# Patient Record
Sex: Female | Born: 1985 | Race: Black or African American | Hispanic: No | Marital: Married | Smoking: Never smoker
Health system: Southern US, Community
[De-identification: ages and names within clinical notes are randomized; demographics above are authoritative.]

## PROBLEM LIST (undated history)

## (undated) ENCOUNTER — Inpatient Hospital Stay (HOSPITAL_COMMUNITY): Payer: Self-pay

## (undated) DIAGNOSIS — A549 Gonococcal infection, unspecified: Secondary | ICD-10-CM

## (undated) DIAGNOSIS — O008 Other ectopic pregnancy without intrauterine pregnancy: Secondary | ICD-10-CM

## (undated) HISTORY — PX: DILATION AND CURETTAGE OF UTERUS: SHX78

## (undated) HISTORY — PX: WISDOM TOOTH EXTRACTION: SHX21

---
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
## 2003-10-26 ENCOUNTER — Emergency Department (HOSPITAL_COMMUNITY): Admission: EM | Admit: 2003-10-26 | Discharge: 2003-10-27 | Payer: Self-pay | Admitting: Emergency Medicine

## 2003-11-07 ENCOUNTER — Ambulatory Visit (HOSPITAL_COMMUNITY): Admission: RE | Admit: 2003-11-07 | Discharge: 2003-11-07 | Payer: Self-pay | Admitting: Obstetrics & Gynecology

## 2003-11-24 ENCOUNTER — Observation Stay (HOSPITAL_COMMUNITY): Admission: AD | Admit: 2003-11-24 | Discharge: 2003-11-25 | Payer: Self-pay | Admitting: Obstetrics

## 2003-12-08 ENCOUNTER — Emergency Department (HOSPITAL_COMMUNITY): Admission: AD | Admit: 2003-12-08 | Discharge: 2003-12-08 | Payer: Self-pay | Admitting: Family Medicine

## 2004-05-24 ENCOUNTER — Inpatient Hospital Stay (HOSPITAL_COMMUNITY): Admission: AD | Admit: 2004-05-24 | Discharge: 2004-05-24 | Payer: Self-pay | Admitting: Obstetrics

## 2004-05-25 ENCOUNTER — Inpatient Hospital Stay (HOSPITAL_COMMUNITY): Admission: AD | Admit: 2004-05-25 | Discharge: 2004-05-25 | Payer: Self-pay | Admitting: Obstetrics

## 2004-06-01 ENCOUNTER — Inpatient Hospital Stay (HOSPITAL_COMMUNITY): Admission: AD | Admit: 2004-06-01 | Discharge: 2004-06-04 | Payer: Self-pay | Admitting: Obstetrics & Gynecology

## 2004-09-05 ENCOUNTER — Inpatient Hospital Stay (HOSPITAL_COMMUNITY): Admission: AD | Admit: 2004-09-05 | Discharge: 2004-09-05 | Payer: Self-pay | Admitting: Obstetrics

## 2004-12-25 ENCOUNTER — Inpatient Hospital Stay (HOSPITAL_COMMUNITY): Admission: AD | Admit: 2004-12-25 | Discharge: 2004-12-25 | Payer: Self-pay | Admitting: Obstetrics

## 2005-08-04 ENCOUNTER — Inpatient Hospital Stay (HOSPITAL_COMMUNITY): Admission: AD | Admit: 2005-08-04 | Discharge: 2005-08-04 | Payer: Self-pay | Admitting: Obstetrics

## 2005-09-16 ENCOUNTER — Inpatient Hospital Stay (HOSPITAL_COMMUNITY): Admission: AD | Admit: 2005-09-16 | Discharge: 2005-09-16 | Payer: Self-pay | Admitting: Obstetrics & Gynecology

## 2005-09-28 ENCOUNTER — Inpatient Hospital Stay (HOSPITAL_COMMUNITY): Admission: AD | Admit: 2005-09-28 | Discharge: 2005-09-28 | Payer: Self-pay | Admitting: Obstetrics

## 2005-11-01 ENCOUNTER — Ambulatory Visit (HOSPITAL_COMMUNITY): Admission: RE | Admit: 2005-11-01 | Discharge: 2005-11-01 | Payer: Self-pay | Admitting: Obstetrics

## 2005-12-28 ENCOUNTER — Inpatient Hospital Stay (HOSPITAL_COMMUNITY): Admission: AD | Admit: 2005-12-28 | Discharge: 2005-12-28 | Payer: Self-pay | Admitting: Obstetrics

## 2006-03-09 ENCOUNTER — Inpatient Hospital Stay (HOSPITAL_COMMUNITY): Admission: AD | Admit: 2006-03-09 | Discharge: 2006-03-09 | Payer: Self-pay | Admitting: Obstetrics & Gynecology

## 2006-03-13 ENCOUNTER — Inpatient Hospital Stay (HOSPITAL_COMMUNITY): Admission: AD | Admit: 2006-03-13 | Discharge: 2006-03-13 | Payer: Self-pay | Admitting: Obstetrics & Gynecology

## 2006-03-17 ENCOUNTER — Inpatient Hospital Stay (HOSPITAL_COMMUNITY): Admission: AD | Admit: 2006-03-17 | Discharge: 2006-03-20 | Payer: Self-pay | Admitting: Obstetrics & Gynecology

## 2006-06-29 ENCOUNTER — Emergency Department (HOSPITAL_COMMUNITY): Admission: EM | Admit: 2006-06-29 | Discharge: 2006-06-29 | Payer: Self-pay | Admitting: Family Medicine

## 2006-11-10 ENCOUNTER — Emergency Department (HOSPITAL_COMMUNITY): Admission: EM | Admit: 2006-11-10 | Discharge: 2006-11-10 | Payer: Self-pay | Admitting: Family Medicine

## 2006-11-11 ENCOUNTER — Emergency Department (HOSPITAL_COMMUNITY): Admission: EM | Admit: 2006-11-11 | Discharge: 2006-11-11 | Payer: Self-pay | Admitting: Emergency Medicine

## 2007-02-13 ENCOUNTER — Emergency Department (HOSPITAL_COMMUNITY): Admission: EM | Admit: 2007-02-13 | Discharge: 2007-02-13 | Payer: Self-pay | Admitting: Family Medicine

## 2007-02-24 ENCOUNTER — Emergency Department (HOSPITAL_COMMUNITY): Admission: EM | Admit: 2007-02-24 | Discharge: 2007-02-24 | Payer: Self-pay | Admitting: Emergency Medicine

## 2007-05-11 ENCOUNTER — Emergency Department (HOSPITAL_COMMUNITY): Admission: EM | Admit: 2007-05-11 | Discharge: 2007-05-11 | Payer: Self-pay | Admitting: Family Medicine

## 2007-05-30 ENCOUNTER — Emergency Department (HOSPITAL_COMMUNITY): Admission: EM | Admit: 2007-05-30 | Discharge: 2007-05-30 | Payer: Self-pay | Admitting: Emergency Medicine

## 2007-06-26 ENCOUNTER — Emergency Department (HOSPITAL_COMMUNITY): Admission: EM | Admit: 2007-06-26 | Discharge: 2007-06-26 | Payer: Self-pay | Admitting: Emergency Medicine

## 2007-07-04 ENCOUNTER — Inpatient Hospital Stay (HOSPITAL_COMMUNITY): Admission: AD | Admit: 2007-07-04 | Discharge: 2007-07-04 | Payer: Self-pay | Admitting: Obstetrics & Gynecology

## 2007-07-12 ENCOUNTER — Emergency Department (HOSPITAL_COMMUNITY): Admission: EM | Admit: 2007-07-12 | Discharge: 2007-07-12 | Payer: Self-pay | Admitting: Emergency Medicine

## 2007-09-22 ENCOUNTER — Inpatient Hospital Stay (HOSPITAL_COMMUNITY): Admission: AD | Admit: 2007-09-22 | Discharge: 2007-09-22 | Payer: Self-pay | Admitting: Obstetrics & Gynecology

## 2007-10-02 ENCOUNTER — Inpatient Hospital Stay (HOSPITAL_COMMUNITY): Admission: AD | Admit: 2007-10-02 | Discharge: 2007-10-02 | Payer: Self-pay | Admitting: Obstetrics

## 2007-11-21 ENCOUNTER — Emergency Department (HOSPITAL_COMMUNITY): Admission: EM | Admit: 2007-11-21 | Discharge: 2007-11-21 | Payer: Self-pay | Admitting: Family Medicine

## 2007-12-17 ENCOUNTER — Inpatient Hospital Stay (HOSPITAL_COMMUNITY): Admission: AD | Admit: 2007-12-17 | Discharge: 2007-12-17 | Payer: Self-pay | Admitting: Obstetrics

## 2008-01-01 ENCOUNTER — Emergency Department (HOSPITAL_COMMUNITY): Admission: EM | Admit: 2008-01-01 | Discharge: 2008-01-01 | Payer: Self-pay | Admitting: Family Medicine

## 2008-03-01 ENCOUNTER — Emergency Department (HOSPITAL_COMMUNITY): Admission: EM | Admit: 2008-03-01 | Discharge: 2008-03-01 | Payer: Self-pay | Admitting: Family Medicine

## 2008-05-06 ENCOUNTER — Emergency Department (HOSPITAL_COMMUNITY): Admission: EM | Admit: 2008-05-06 | Discharge: 2008-05-06 | Payer: Self-pay | Admitting: Emergency Medicine

## 2008-05-08 ENCOUNTER — Emergency Department (HOSPITAL_COMMUNITY): Admission: EM | Admit: 2008-05-08 | Discharge: 2008-05-08 | Payer: Self-pay | Admitting: Emergency Medicine

## 2008-05-10 ENCOUNTER — Inpatient Hospital Stay (HOSPITAL_COMMUNITY): Admission: AD | Admit: 2008-05-10 | Discharge: 2008-05-10 | Payer: Self-pay | Admitting: Obstetrics

## 2008-07-02 ENCOUNTER — Emergency Department (HOSPITAL_COMMUNITY): Admission: EM | Admit: 2008-07-02 | Discharge: 2008-07-02 | Payer: Self-pay | Admitting: Family Medicine

## 2008-07-12 ENCOUNTER — Emergency Department (HOSPITAL_COMMUNITY): Admission: EM | Admit: 2008-07-12 | Discharge: 2008-07-12 | Payer: Self-pay | Admitting: Family Medicine

## 2008-09-23 ENCOUNTER — Emergency Department (HOSPITAL_COMMUNITY): Admission: EM | Admit: 2008-09-23 | Discharge: 2008-09-23 | Payer: Self-pay | Admitting: Family Medicine

## 2008-09-25 ENCOUNTER — Emergency Department (HOSPITAL_COMMUNITY): Admission: EM | Admit: 2008-09-25 | Discharge: 2008-09-25 | Payer: Self-pay | Admitting: Emergency Medicine

## 2008-11-15 ENCOUNTER — Inpatient Hospital Stay (HOSPITAL_COMMUNITY): Admission: AD | Admit: 2008-11-15 | Discharge: 2008-11-15 | Payer: Self-pay | Admitting: Obstetrics & Gynecology

## 2009-01-05 ENCOUNTER — Emergency Department (HOSPITAL_COMMUNITY): Admission: EM | Admit: 2009-01-05 | Discharge: 2009-01-05 | Payer: Self-pay | Admitting: Family Medicine

## 2009-05-13 ENCOUNTER — Emergency Department (HOSPITAL_COMMUNITY): Admission: EM | Admit: 2009-05-13 | Discharge: 2009-05-13 | Payer: Self-pay | Admitting: Family Medicine

## 2009-06-17 ENCOUNTER — Emergency Department (HOSPITAL_COMMUNITY): Admission: EM | Admit: 2009-06-17 | Discharge: 2009-06-17 | Payer: Self-pay | Admitting: Family Medicine

## 2009-09-15 ENCOUNTER — Emergency Department (HOSPITAL_COMMUNITY): Admission: EM | Admit: 2009-09-15 | Discharge: 2009-09-15 | Payer: Self-pay | Admitting: Emergency Medicine

## 2009-09-19 ENCOUNTER — Emergency Department (HOSPITAL_COMMUNITY): Admission: EM | Admit: 2009-09-19 | Discharge: 2009-09-19 | Payer: Self-pay | Admitting: Family Medicine

## 2010-02-24 ENCOUNTER — Emergency Department (HOSPITAL_COMMUNITY): Admission: EM | Admit: 2010-02-24 | Discharge: 2010-02-24 | Payer: Self-pay | Admitting: Family Medicine

## 2010-10-09 ENCOUNTER — Encounter: Payer: Self-pay | Admitting: Obstetrics & Gynecology

## 2010-10-27 ENCOUNTER — Inpatient Hospital Stay (INDEPENDENT_AMBULATORY_CARE_PROVIDER_SITE_OTHER)
Admission: RE | Admit: 2010-10-27 | Discharge: 2010-10-27 | Disposition: A | Discharge status: Home / Self Care | Payer: Medicaid Other | Source: Ambulatory Visit | Attending: Emergency Medicine | Admitting: Emergency Medicine

## 2010-10-27 DIAGNOSIS — R51 Headache: Secondary | ICD-10-CM

## 2010-10-27 LAB — POCT URINALYSIS DIPSTICK
Bilirubin Urine: NEGATIVE
Ketones, ur: NEGATIVE mg/dL
Protein, ur: NEGATIVE mg/dL
pH: 5.5 (ref 5.0–8.0)

## 2010-12-05 LAB — POCT PREGNANCY, URINE: Preg Test, Ur: POSITIVE

## 2010-12-06 LAB — POCT PREGNANCY, URINE: Preg Test, Ur: NEGATIVE

## 2010-12-24 LAB — HERPES SIMPLEX VIRUS CULTURE: Culture: DETECTED

## 2010-12-29 LAB — POCT URINALYSIS DIP (DEVICE)
Bilirubin Urine: NEGATIVE
Ketones, ur: NEGATIVE mg/dL
Nitrite: NEGATIVE
Protein, ur: NEGATIVE mg/dL
Specific Gravity, Urine: 1.025 (ref 1.005–1.030)
pH: 7 (ref 5.0–8.0)

## 2010-12-29 LAB — POCT PREGNANCY, URINE: Preg Test, Ur: NEGATIVE

## 2010-12-29 LAB — POCT I-STAT, CHEM 8: TCO2: 29 mmol/L (ref 0–100)

## 2011-01-04 LAB — URINALYSIS, ROUTINE W REFLEX MICROSCOPIC
Bilirubin Urine: NEGATIVE
Glucose, UA: NEGATIVE mg/dL
Nitrite: NEGATIVE
Specific Gravity, Urine: 1.02 (ref 1.005–1.030)

## 2011-02-04 NOTE — Discharge Summary (Signed)
NAME:  [name] [name], [name] [name]                           ACCOUNT NO.:  [id number]   MEDICAL RECORD NO.:  [id number]                   PATIENT TYPE:  INP   LOCATION:  9159                                 FACILITY:  [facility]   PHYSICIAN:  James A. Ashley Royalty, M.D.             DATE OF BIRTH:  07-10-1986   DATE OF ADMISSION:  07/12/2002  DATE OF DISCHARGE:  07/15/2002                                 DISCHARGE SUMMARY   DISCHARGE DIAGNOSES:  1. Intrauterine pregnancy at 37 and 1/[redacted] weeks gestation.  2. Preterm labor--recurrent, lysed.  3. Group B Streptococcus carrier.   OPERATIONS/SPECIAL PROCEDURES:  None.   DISCHARGE MEDICATIONS:  Terbutaline 2.5 mg p.o. q.4h.   HISTORY AND PHYSICAL:  This is an 25 year old gravida 2, para 0, AB 1 at [redacted]  weeks gestation on admission.  The patient was seen in maternity admissions  two or three days ago with symptoms of preterm labor.  Cultures were done at  that time and overturned as negative for gonorrhea and Chlamydia.  The  patient was noted to be Group B strep positive.  The patient responded well  at that time to subcutaneous terbutaline.  She was discharged home on  terbutaline 2.5 mg p.o. q.4h.  The patient presented on the day of admission  with similar symptoms and at the time of admission admitted she had not  taken her terbutaline for at least 5-6 hours.  For the remainder of the  history and physical, please see the chart.   HOSPITAL COURSE:  The patient was admitted to [facility].  Admission laboratories studies were drawn.  Cervix was noted to  be closed, soft, with a high presenting part.  The patient was given IV  fluids and failed to respond appropriately.  She was also given terbutaline  and we were unable to give sufficient quantities due to tachycardia to lyse  her contractions; hence, she was begun on IV magnesium sulfate.  The  magnesium sulfate did successfully lyse her contractions.  We also gave IV  antibiotics.  The  patient mentioned the possibility of leaving against  medical advice on a couple of occasions.  For this reason, she was given  betamethasone in divided doses over 24 hours.  On the morning of October 27,  her uterus was felt to be quiescent and she was switched from IV magnesium  sulfate to p.o. terbutaline 2.5 every 4 hours.  She continued on that course  with having irritability only and was felt stable for discharge on the  afternoon of July 15, 2002.  She was discharged home in a satisfactory  condition.   ACCESSORY CLINICAL FINDINGS:  Magnesium level, October 25, was 5.7.  CBC on  admission revealed a white blood count of 13.6, hemoglobin 11.5, platelet  count 382,000.    DISPOSITION:  The patient is to return to Dr. Elliot Gault' office the morning of  July 17, 2002, for her antenatal visit.                                                James A. Ashley Royalty, M.D.    JAM/MEDQ  D:  07/15/2002  T:  07/16/2002  Job:  [id number]   cc:   Georgina Peer, M.D.  [address]  [place]  [place] [address]  Fax: [phone]

## 2011-02-04 NOTE — H&P (Signed)
NAMECHANTAL, WORTHEY                 ACCOUNT NO.:  [id number]   MEDICAL RECORD NO.:  [id number]          PATIENT TYPE:  INP   LOCATION:  9168                          FACILITY:  [facility]   PHYSICIAN:  Roseanna Rainbow, M.D.DATE OF BIRTH:  12-12-1985   DATE OF ADMISSION:  03/17/2006  DATE OF DISCHARGE:                                HISTORY & PHYSICAL   CHIEF COMPLAINT:  The patient is a 25 year old para 2 with an intrauterine  pregnancy at 39+ weeks complaining of regular uterine contractions.   HISTORY OF PRESENT ILLNESS:  Please see the above.   ALLERGIES:  No known drug allergies.   MEDICATIONS:  Prenatal vitamins, Tylenol.   SOCIAL HISTORY:  No tobacco, ethanol or drug use.   OBSTETRIC RISK FACTORS:  None.   LABS:  Blood type A positive, antibody screen negative. GBS positive. HIV  nonreactive.   PAST OBSTETRICAL HISTORY:  She is status post two NSVD's and one voluntary  termination of pregnancy.   PAST MEDICAL HISTORY:  She denies.   PAST SURGICAL HISTORY:  Please see the above.   PHYSICAL EXAMINATION:  VITAL SIGNS:  Temperature 98.8, pulse 105,  respirations 20, blood pressure 122/67.  Fetal heart rate reassuring.  Tocodynamometer with contractions every 3 to 5 minutes.  STERILE VAGINAL EXAM:  (Per the R. N.) The cervix is 4 cm dilated, 70%  effaced.   ASSESSMENT:  Multipara at term, latent labor, fetal heart tracing consistent  with fetal well being. GBS positive.   PLAN:  Admission. Penicillin GBS prophylaxis. Expectant management.      Roseanna Rainbow, M.D.  Electronically Signed     LAJ/MEDQ  D:  03/17/2006  T:  03/17/2006  Job:  [id number]

## 2011-05-31 ENCOUNTER — Inpatient Hospital Stay (INDEPENDENT_AMBULATORY_CARE_PROVIDER_SITE_OTHER)
Admission: RE | Admit: 2011-05-31 | Discharge: 2011-05-31 | Disposition: A | Discharge status: Home / Self Care | Payer: Medicaid Other | Source: Ambulatory Visit | Attending: Family Medicine | Admitting: Family Medicine

## 2011-05-31 DIAGNOSIS — B009 Herpesviral infection, unspecified: Secondary | ICD-10-CM

## 2011-06-02 LAB — HERPES SIMPLEX VIRUS CULTURE

## 2011-06-08 LAB — URINALYSIS, ROUTINE W REFLEX MICROSCOPIC
Bilirubin Urine: NEGATIVE
Glucose, UA: NEGATIVE
Glucose, UA: NEGATIVE
Hgb urine dipstick: NEGATIVE
Ketones, ur: NEGATIVE
Leukocytes, UA: NEGATIVE
Protein, ur: NEGATIVE
Specific Gravity, Urine: 1.02
Urobilinogen, UA: 0.2
pH: 6

## 2011-06-08 LAB — CBC
HCT: 38.7
Hemoglobin: 13.4
MCHC: 34.5
MCV: 92.2
Platelets: 345

## 2011-06-08 LAB — URINE MICROSCOPIC-ADD ON

## 2011-06-08 LAB — POCT PREGNANCY, URINE: Operator id: 117411

## 2011-06-08 LAB — HCG, QUANTITATIVE, PREGNANCY: hCG, Beta Chain, Quant, S: 3505 — ABNORMAL HIGH

## 2011-06-13 LAB — POCT URINALYSIS DIP (DEVICE)
Hgb urine dipstick: NEGATIVE
Nitrite: NEGATIVE
Urobilinogen, UA: 0.2
pH: 6.5

## 2011-06-13 LAB — WET PREP, GENITAL
Clue Cells Wet Prep HPF POC: NONE SEEN
Yeast Wet Prep HPF POC: NONE SEEN

## 2011-06-13 LAB — HCG, QUANTITATIVE, PREGNANCY: hCG, Beta Chain, Quant, S: 27 — ABNORMAL HIGH

## 2011-06-13 LAB — POCT PREGNANCY, URINE
Preg Test, Ur: POSITIVE
Preg Test, Ur: NEGATIVE

## 2011-06-13 LAB — GC/CHLAMYDIA PROBE AMP, GENITAL: GC Probe Amp, Genital: NEGATIVE

## 2011-06-16 LAB — GC/CHLAMYDIA PROBE AMP, GENITAL
Chlamydia, DNA Probe: NEGATIVE
GC Probe Amp, Genital: NEGATIVE

## 2011-06-16 LAB — WET PREP, GENITAL

## 2011-06-29 LAB — GC/CHLAMYDIA PROBE AMP, GENITAL
Chlamydia, DNA Probe: NEGATIVE
GC Probe Amp, Genital: NEGATIVE

## 2011-06-29 LAB — CBC
Hemoglobin: 13.3
MCHC: 34.2
Platelets: 301
RDW: 12.2

## 2011-06-29 LAB — WET PREP, GENITAL: Trich, Wet Prep: NONE SEEN

## 2011-06-30 LAB — POCT URINALYSIS DIP (DEVICE)
Glucose, UA: NEGATIVE
Glucose, UA: NEGATIVE
Leukocytes, UA: NEGATIVE
Nitrite: NEGATIVE
Nitrite: NEGATIVE
Operator id: 235561
Urobilinogen, UA: 0.2
Urobilinogen, UA: 0.2

## 2011-06-30 LAB — GC/CHLAMYDIA PROBE AMP, GENITAL
Chlamydia, DNA Probe: NEGATIVE
GC Probe Amp, Genital: NEGATIVE

## 2011-06-30 LAB — POCT PREGNANCY, URINE: Preg Test, Ur: NEGATIVE

## 2011-06-30 LAB — WET PREP, GENITAL: Clue Cells Wet Prep HPF POC: NONE SEEN

## 2011-07-01 LAB — POCT PREGNANCY, URINE
Operator id: 239701
Preg Test, Ur: POSITIVE

## 2011-07-01 LAB — POCT URINALYSIS DIP (DEVICE)
Bilirubin Urine: NEGATIVE
Glucose, UA: NEGATIVE
Nitrite: NEGATIVE

## 2011-07-01 LAB — GC/CHLAMYDIA PROBE AMP, GENITAL
Chlamydia, DNA Probe: NEGATIVE
GC Probe Amp, Genital: NEGATIVE

## 2011-07-01 LAB — WET PREP, GENITAL
Trich, Wet Prep: NONE SEEN
Yeast Wet Prep HPF POC: NONE SEEN

## 2011-07-07 LAB — WET PREP, GENITAL: Yeast Wet Prep HPF POC: NONE SEEN

## 2011-07-07 LAB — POCT URINALYSIS DIP (DEVICE)
Bilirubin Urine: NEGATIVE
Glucose, UA: NEGATIVE
Hgb urine dipstick: NEGATIVE
Specific Gravity, Urine: >=1.03

## 2011-07-07 LAB — GC/CHLAMYDIA PROBE AMP, GENITAL: Chlamydia, DNA Probe: NEGATIVE

## 2011-09-17 ENCOUNTER — Inpatient Hospital Stay (HOSPITAL_COMMUNITY)
Admission: AD | Admit: 2011-09-17 | Discharge: 2011-09-17 | Disposition: A | Discharge status: Home / Self Care | Payer: Medicaid Other | Source: Ambulatory Visit | Attending: Obstetrics | Admitting: Obstetrics

## 2011-09-17 ENCOUNTER — Inpatient Hospital Stay (HOSPITAL_COMMUNITY): Payer: Medicaid Other

## 2011-09-17 ENCOUNTER — Encounter (HOSPITAL_COMMUNITY): Payer: Self-pay | Admitting: *Deleted

## 2011-09-17 DIAGNOSIS — O418X90 Other specified disorders of amniotic fluid and membranes, unspecified trimester, not applicable or unspecified: Secondary | ICD-10-CM

## 2011-09-17 DIAGNOSIS — O468X9 Other antepartum hemorrhage, unspecified trimester: Secondary | ICD-10-CM

## 2011-09-17 DIAGNOSIS — R109 Unspecified abdominal pain: Secondary | ICD-10-CM | POA: Insufficient documentation

## 2011-09-17 DIAGNOSIS — O209 Hemorrhage in early pregnancy, unspecified: Secondary | ICD-10-CM

## 2011-09-17 LAB — WET PREP, GENITAL
Trich, Wet Prep: NONE SEEN
Yeast Wet Prep HPF POC: NONE SEEN

## 2011-09-17 LAB — URINALYSIS, ROUTINE W REFLEX MICROSCOPIC
Bilirubin Urine: NEGATIVE
Leukocytes, UA: NEGATIVE
Protein, ur: NEGATIVE mg/dL
pH: 5.5 (ref 5.0–8.0)

## 2011-09-17 LAB — URINE MICROSCOPIC-ADD ON

## 2011-09-17 LAB — HCG, QUANTITATIVE, PREGNANCY: hCG, Beta Chain, Quant, S: 120470 m[IU]/mL — ABNORMAL HIGH (ref <5)

## 2011-09-17 LAB — CBC
Hemoglobin: 13.9 g/dL (ref 12.0–15.0)
MCHC: 33.9 g/dL (ref 30.0–36.0)
RDW: 12.6 % (ref 11.5–15.5)
WBC: 10.9 10*3/uL — ABNORMAL HIGH (ref 4.0–10.5)

## 2011-09-17 LAB — POCT PREGNANCY, URINE: Preg Test, Ur: POSITIVE

## 2011-09-17 LAB — ABO/RH: ABO/RH(D): A POS

## 2011-09-17 MED ORDER — PROMETHAZINE HCL 25 MG PO TABS: 25.0000 mg | ORAL_TABLET | Freq: Four times a day (QID) | ORAL | Status: DC | PRN

## 2011-09-17 NOTE — ED Provider Notes (Signed)
History     Chief Complaint  Patient presents with  . Vaginal Bleeding   HPI [name] [name] 25 y.o. 7w 1d by LMP.  Has been seen in the office once during this pregnancy.  Has not had an ultrasound.  Has been having some vaginal bleeding and cramping for 3 days.   OB History    Grav Para Term Preterm Abortions TAB SAB Ect Mult Living   8 3 3  0 4 4 0 0 0 3      Past Medical History  Diagnosis Date  . Abortion history     Past Surgical History  Procedure Date  . No past surgeries     No family history on file.  History  Substance Use Topics  . Smoking status: Never Smoker   . Smokeless tobacco: Not on file  . Alcohol Use: No    Allergies:  Allergies  Allergen Reactions  . Tamiflu Rash    Prescriptions prior to admission  Medication Sig Dispense Refill  . acetaminophen (TYLENOL) 500 MG tablet Take 500-1,000 mg by mouth every 6 (six) hours as needed. Head aches         Review of Systems  Gastrointestinal: Positive for abdominal pain.  Genitourinary:       Vaginal bleeding.   Physical Exam   Blood pressure 128/86, pulse 99, temperature 98.5 F (36.9 C), temperature source Oral, resp. rate 18, height 4\' 11"  (1.499 m), weight 141 lb (63.957 kg), last menstrual period 07/29/2011.  Physical Exam  Nursing note and vitals reviewed. Constitutional: She is oriented to person, place, and time. She appears well-developed and well-nourished.  HENT:  Head: Normocephalic.  Eyes: EOM are normal.  Neck: Neck supple.  GI: Soft. There is no tenderness. There is no rebound and no guarding.  Genitourinary:       Speculum exam: Vagina - Small amount of creamy discharge, small amount of old blood noted- brown streaks only Cervix - No contact bleeding Bimanual exam: Cervix closed Uterus non tender, 6 week size Adnexa non tender, no masses bilaterally GC/Chlam, wet prep done Chaperone present for exam.  Musculoskeletal: Normal range of motion.  Neurological: She is  alert and oriented to person, place, and time.  Skin: Skin is warm and dry.  Psychiatric: She has a normal mood and affect.    MAU Course  Procedures Results for orders placed during the hospital encounter of 09/17/11 (from the past 24 hour(s))  URINALYSIS, ROUTINE W REFLEX MICROSCOPIC     Status: Abnormal   Collection Time   09/17/11  6:20 PM      Component Value Range   Color, Urine YELLOW  YELLOW    APPearance CLEAR  CLEAR    Specific Gravity, Urine 1.025  1.005 - 1.030    pH 5.5  5.0 - 8.0    Glucose, UA NEGATIVE  NEGATIVE (mg/dL)   Hgb urine dipstick SMALL (*) NEGATIVE    Bilirubin Urine NEGATIVE  NEGATIVE    Ketones, ur NEGATIVE  NEGATIVE (mg/dL)   Protein, ur NEGATIVE  NEGATIVE (mg/dL)   Urobilinogen, UA 0.2  0.0 - 1.0 (mg/dL)   Nitrite NEGATIVE  NEGATIVE    Leukocytes, UA NEGATIVE  NEGATIVE   URINE MICROSCOPIC-ADD ON     Status: Abnormal   Collection Time   09/17/11  6:20 PM      Component Value Range   Squamous Epithelial / LPF RARE  RARE    WBC, UA    <3 (WBC/hpf)  Value: NO FORMED ELEMENTS SEEN ON URINE MICROSCOPIC EXAMINATION   RBC / HPF 0-2  <3 (RBC/hpf)   Bacteria, UA FEW (*) RARE   POCT PREGNANCY, URINE     Status: Normal   Collection Time   09/17/11  6:39 PM      Component Value Range   Preg Test, Ur POSITIVE    HCG, QUANTITATIVE, PREGNANCY     Status: Abnormal   Collection Time   09/17/11  7:20 PM      Component Value Range   hCG, Beta Chain, Sharene Butters, [place] [id number] (*) <5 (mIU/mL)  CBC     Status: Abnormal   Collection Time   09/17/11  7:20 PM      Component Value Range   WBC 10.9 (*) 4.0 - 10.5 (K/uL)   RBC 4.41  3.87 - 5.11 (MIL/uL)   Hemoglobin 13.9  12.0 - 15.0 (g/dL)   HCT 04.5  40.9 - 81.1 (%)   MCV 93.0  78.0 - 100.0 (fL)   MCH 31.5  26.0 - 34.0 (pg)   MCHC 33.9  30.0 - 36.0 (g/dL)   RDW 91.4  78.2 - 95.6 (%)   Platelets 288  150 - 400 (K/uL)  ABO/RH     Status: Normal   Collection Time   09/17/11  7:20 PM      Component Value Range    ABO/RH(D) A POS    WET PREP, GENITAL     Status: Abnormal   Collection Time   09/17/11  7:30 PM      Component Value Range   Yeast, Wet Prep NONE SEEN  NONE SEEN    Trich, Wet Prep NONE SEEN  NONE SEEN    Clue Cells, Wet Prep FEW (*) NONE SEEN    WBC, Wet Prep HPF POC FEW (*) NONE SEEN     MDM Ultrasound - IUP 7w 1d with FHT 132, small subchorionic hemorrhage noted  Assessment and Plan  Bleeding in pregnancy  Plan Cultures pending No sex if having bleeding Keep your appointment in the office Call your doctor if you have questions or concerns.  BURLESON,TERRI 09/17/2011, 8:22 PM   Nolene Bernheim, NP 09/17/11 2028

## 2011-09-17 NOTE — ED Notes (Signed)
Lilyan Punt NP in to discuss u/s results and d/c plan with pt

## 2011-09-17 NOTE — Progress Notes (Signed)
T.Burleson NP in to see pt. Spec exam done and wet prep and GC/Chlam obtained. Pt tol well. Minimal brown blood in vagina.

## 2011-09-17 NOTE — Progress Notes (Addendum)
Pt reports having vaginal bleeding and cramping x3 days. Bleeding is brown to bright red. Reports having N/V that started today.

## 2011-09-17 NOTE — Progress Notes (Signed)
Written and verbal d/c instruction given and understanding voiced 

## 2011-09-17 NOTE — Progress Notes (Signed)
Pt presents to MAU with chief complaint of vaginal bleeding. Pt is [redacted]w[redacted]d; G8P3 . Bleeding started 3 days ago; bright red, brown, enough bleeding that patient has to wear a pad. Lower abdominal cramping started 3 days ago

## 2011-10-04 LAB — OB RESULTS CONSOLE HIV ANTIBODY (ROUTINE TESTING): HIV: NONREACTIVE

## 2011-10-04 LAB — OB RESULTS CONSOLE RUBELLA ANTIBODY, IGM: Rubella: IMMUNE

## 2011-10-05 ENCOUNTER — Inpatient Hospital Stay (HOSPITAL_COMMUNITY)
Admission: AD | Admit: 2011-10-05 | Discharge: 2011-10-05 | Disposition: A | Discharge status: Home / Self Care | Payer: Medicaid Other | Source: Ambulatory Visit | Attending: Obstetrics & Gynecology | Admitting: Obstetrics & Gynecology

## 2011-10-05 ENCOUNTER — Encounter (HOSPITAL_COMMUNITY): Payer: Self-pay

## 2011-10-05 DIAGNOSIS — O99891 Other specified diseases and conditions complicating pregnancy: Secondary | ICD-10-CM | POA: Insufficient documentation

## 2011-10-05 DIAGNOSIS — R21 Rash and other nonspecific skin eruption: Secondary | ICD-10-CM | POA: Insufficient documentation

## 2011-10-05 MED ORDER — PERMETHRIN 5 % EX CREA: TOPICAL_CREAM | Freq: Once | CUTANEOUS | Status: AC

## 2011-10-05 NOTE — Progress Notes (Signed)
Patient states she started having a rash on the inside of the left wrist 2 days ago. Used cortisone cream and it helped a little, but not breaking out in another area. No pain. No pregnancy problems.

## 2011-10-05 NOTE — ED Provider Notes (Signed)
History   Pt presents today c/o a rash to her hands and left wrist. She states she has been using hydrocortisone cream which has helped a little. She denies itching at this time. She states her daughter was diagnosed with scabies about 2 wks ago. She denies rash or irritation anywhere else on her body. She denies abd pain, vag dc, bleeding, or any other sx at this time. She is currently 9.[redacted]wks pregnant.  Chief Complaint  Patient presents with  . Rash   HPI  OB History    Grav Para Term Preterm Abortions TAB SAB Ect Mult Living   8 3 3  0 4 4 0 0 0 3      Past Medical History  Diagnosis Date  . Abortion history     Past Surgical History  Procedure Date  . No past surgeries     No family history on file.  History  Substance Use Topics  . Smoking status: Never Smoker   . Smokeless tobacco: Not on file  . Alcohol Use: No    Allergies:  Allergies  Allergen Reactions  . Tamiflu Rash    Prescriptions prior to admission  Medication Sig Dispense Refill  . acetaminophen (TYLENOL) 500 MG tablet Take 500-1,000 mg by mouth every 6 (six) hours as needed. Head aches       . Prenatal Vit-Fe Fumarate-FA (PRENATAL MULTIVITAMIN) TABS Take 1 tablet by mouth daily. At breakfast      . promethazine (PHENERGAN) 25 MG tablet Take 25 mg by mouth every 6 (six) hours as needed. For nausea        Review of Systems  Constitutional: Negative for fever, chills and malaise/fatigue.  Eyes: Negative for blurred vision and double vision.  Cardiovascular: Negative for chest pain.  Gastrointestinal: Negative for nausea, vomiting, abdominal pain, diarrhea and constipation.  Genitourinary: Negative for dysuria, urgency, frequency and hematuria.  Skin: Positive for itching and rash.  Neurological: Negative for dizziness and headaches.  Psychiatric/Behavioral: Negative for depression and suicidal ideas.   Physical Exam   Blood pressure 132/77, pulse 108, temperature 99.7 F (37.6 C), temperature  source Oral, resp. rate 16, height 4\' 11"  (1.499 m), weight 141 lb 6.4 oz (64.139 kg), last menstrual period 07/29/2011, SpO2 99.00%.  Physical Exam  Nursing note and vitals reviewed. Constitutional: She is oriented to person, place, and time. She appears well-developed and well-nourished. No distress.  HENT:  Head: Normocephalic and atraumatic.  Eyes: EOM are normal. Pupils are equal, round, and reactive to light.  GI: Soft. She exhibits no distension. There is no tenderness. There is no rebound and no guarding.  Neurological: She is alert and oriented to person, place, and time.  Skin: Skin is warm and dry. Rash noted. She is not diaphoretic. There is erythema.     Psychiatric: She has a normal mood and affect. Her behavior is normal. Judgment and thought content normal.    MAU Course  Procedures    Assessment and Plan  Rash: Will tx for scabies given that pt daughter was diagnosed with the same 2wks ago. Will give Rx for Elimite cream. She has a f/u appt with Dr. Clearance Coots scheduled. Discussed diet, activity, risks, and precautions.   Clinton Gallant. Rice III, DrHSc, MPAS, PA-C  10/05/2011, 8:25 AM   Henrietta Hoover, PA 10/05/11 0830

## 2011-11-25 ENCOUNTER — Emergency Department (INDEPENDENT_AMBULATORY_CARE_PROVIDER_SITE_OTHER)
Admission: EM | Admit: 2011-11-25 | Discharge: 2011-11-25 | Disposition: A | Discharge status: Home / Self Care | Payer: Medicaid Other | Source: Home / Self Care | Attending: Emergency Medicine | Admitting: Emergency Medicine

## 2011-11-25 ENCOUNTER — Encounter (HOSPITAL_COMMUNITY): Payer: Self-pay | Admitting: Cardiology

## 2011-11-25 DIAGNOSIS — J029 Acute pharyngitis, unspecified: Secondary | ICD-10-CM

## 2011-11-25 LAB — POCT RAPID STREP A: Streptococcus, Group A Screen (Direct): NEGATIVE

## 2011-11-25 NOTE — ED Provider Notes (Signed)
History     CSN: [id number]  Arrival date & time 11/25/11  2952   First MD Initiated Contact with Patient 11/25/11 [phone]      Chief Complaint  Patient presents with  . Sore Throat  . Cough  . Nasal Congestion    (Consider location/radiation/quality/duration/timing/severity/associated sxs/prior treatment) HPI Comments: Patient with sore throat, mild rhinorrhea, postnasal drip starting yesterday. States that both her ears are bothering her. No coughing, wheezing, chest pain, shortness of breath, nausea, vomiting, rash. Patient is 5 months pregnant, reports feeling the baby move. No abdominal pain, cramping, vaginal bleeding. Patient states that her daughter has similar symptoms. No drooling, trismus, reflux symptoms. Patient has been taking cough drops without relief.   ROS as noted in HPI. All other ROS negative.   Patient is a 26 y.o. female presenting with pharyngitis. The history is provided by the patient.  Sore Throat This is a new problem. The current episode started yesterday. The problem occurs constantly. The problem has not changed since onset.   Past Medical History  Diagnosis Date  . No pertinent past medical history     Past Surgical History  Procedure Date  . Dilation and curettage of uterus   . Wisdom tooth extraction     Family History  Problem Relation Age of Onset  . Anesthesia problems Neg Hx     History  Substance Use Topics  . Smoking status: Never Smoker   . Smokeless tobacco: Never Used  . Alcohol Use: No    OB History    Grav Para Term Preterm Abortions TAB SAB Ect Mult Living   8 3 3  0 4 4 0 0 0 3      Review of Systems  Allergies  Tamiflu  Home Medications   Current Outpatient Rx  Name Route Sig Dispense Refill  . ACETAMINOPHEN 500 MG PO TABS Oral Take 500-1,000 mg by mouth every 6 (six) hours as needed. Head aches     . PRENATAL MULTIVITAMIN CH Oral Take 1 tablet by mouth daily. At breakfast      BP 120/84  Pulse 104   Temp(Src) 98.7 F (37.1 C) (Oral)  Resp 16  SpO2 98%  LMP 07/29/2011  Physical Exam  Nursing note and vitals reviewed. Constitutional: She is oriented to person, place, and time. She appears well-developed and well-nourished.  HENT:  Head: Normocephalic and atraumatic. No trismus in the jaw.  Right Ear: Tympanic membrane and ear canal normal.  Left Ear: Tympanic membrane and ear canal normal.  Nose: Rhinorrhea present. No epistaxis.  Mouth/Throat: Uvula is midline and mucous membranes are normal. Posterior oropharyngeal erythema present. No oropharyngeal exudate.  Eyes: Conjunctivae and EOM are normal.  Neck: Normal range of motion. Neck supple.  Cardiovascular: Normal rate, regular rhythm and normal heart sounds.   Pulmonary/Chest: Effort normal and breath sounds normal.  Musculoskeletal: Normal range of motion.  Lymphadenopathy:    She has no cervical adenopathy.  Neurological: She is alert and oriented to person, place, and time.  Skin: Skin is warm and dry. No rash noted.  Psychiatric: She has a normal mood and affect. Her behavior is normal. Judgment and thought content normal.    ED Course  Procedures (including critical care time)   Labs Reviewed  POCT RAPID STREP A ([facility]ARE ONLY)   No results found.   No diagnosis found. Results for orders placed during the hospital encounter of 11/25/11  POCT RAPID STREP A ([facility] ONLY)  Component Value Range   Streptococcus, Group A Screen (Direct) NEGATIVE  NEGATIVE      MDM  RAS neg.  Luiz Blare, MD 11/25/11 [phone]

## 2011-11-25 NOTE — Discharge Instructions (Signed)
Continue the Tylenol, you may take 1 g of 4 times a day. . These are safe during pregnancy: Sucrets, Cepacol (spray or lozenges), Chloraseptic spray or lozenges, warm salt water gargle. You may also use the Neti  pot or the Dixie med sinus rinse as often as you want to to help reduce nasal congestion. Follow the directions on the box.

## 2011-11-25 NOTE — ED Notes (Signed)
Pt report sore throat, cough, and nasal congestion that started this past Wed. Denies fever. Tolerating PO intake.

## 2012-02-22 ENCOUNTER — Encounter (HOSPITAL_COMMUNITY): Payer: Self-pay | Admitting: *Deleted

## 2012-02-22 ENCOUNTER — Emergency Department (HOSPITAL_COMMUNITY)
Admission: EM | Admit: 2012-02-22 | Discharge: 2012-02-22 | Disposition: A | Discharge status: Home / Self Care | Payer: Medicaid Other | Source: Home / Self Care | Attending: Family Medicine | Admitting: Family Medicine

## 2012-02-22 DIAGNOSIS — N760 Acute vaginitis: Secondary | ICD-10-CM

## 2012-02-22 LAB — WET PREP, GENITAL

## 2012-02-22 MED ORDER — FLUCONAZOLE 100 MG PO TABS: ORAL_TABLET | ORAL | Status: DC

## 2012-02-22 MED ORDER — METRONIDAZOLE 500 MG PO TABS: 500.0000 mg | ORAL_TABLET | Freq: Two times a day (BID) | ORAL | Status: AC

## 2012-02-22 NOTE — ED Notes (Signed)
Vital signs stable.  FETAL heart tones strong 144

## 2012-02-22 NOTE — ED Notes (Signed)
Pt with c/o vaginal discharge and itching x one week - pt [redacted] weeks pregnant seen yesterday by ob/gyn routine exam - per pt physician gave her prescription for diflucan did not do a pelvic exam - pt wants to be checked for bacterial infectional

## 2012-02-22 NOTE — Discharge Instructions (Signed)
You do have findings consistent with yeast and bacterial vaginosis. Take the prescribed medications as instructed. Followup with your primary obstetrician for followup as your symptoms. We will call you if any abnormal results on the pending tests.

## 2012-02-22 NOTE — ED Provider Notes (Signed)
History     CSN: [id number]  Arrival date & time 02/22/12  1100   First MD Initiated Contact with Patient 02/22/12 1102      Chief Complaint  Patient presents with  . Vaginal Discharge  . Vaginal Itching    (Consider location/radiation/quality/duration/timing/severity/associated sxs/prior treatment) HPI Comments: 26 year old female 29 weight weeks pregnant. Here complaining of vaginal swelling, itchiness and discharge for one week. She was seen by her OB yesterday and had a prescription for Diflucan 150 mg x1. Patient states symptoms unchanged she did not have a pelvic exam yesterday. "Wants to have a pelvic exam and be checked for bacterial infections". She also wants to be checked for STDs. Denies domestic violence. States her urine was checked yesterday as was normal. Denies dysuria, hematuria or frequency. No fever or chills. No pelvic pain. No cramping . Reports sporadic contractions since [redacted] weeks pregnant. Denies current uterine contractions or pain. Feels baby moving.      Past Medical History  Diagnosis Date  . No pertinent past medical history   . Pregnant     Past Surgical History  Procedure Date  . Dilation and curettage of uterus   . Wisdom tooth extraction     Family History  Problem Relation Age of Onset  . Anesthesia problems Neg Hx     History  Substance Use Topics  . Smoking status: Never Smoker   . Smokeless tobacco: Never Used  . Alcohol Use: No    OB History    Grav Para Term Preterm Abortions TAB SAB Ect Mult Living   8 3 3  0 4 4 0 0 0 3      Review of Systems  Constitutional: Negative for fever and chills.  Gastrointestinal: Negative for nausea, vomiting and abdominal pain.  Genitourinary: Positive for vaginal discharge. Negative for dysuria, urgency, frequency, hematuria, flank pain, vaginal bleeding and pelvic pain.  Neurological: Negative for dizziness and headaches.  All other systems reviewed and are negative.    Allergies    Tamiflu  Home Medications   Current Outpatient Rx  Name Route Sig Dispense Refill  . ACETAMINOPHEN 500 MG PO TABS Oral Take 500-1,000 mg by mouth every 6 (six) hours as needed. Head aches     . FLUCONAZOLE 100 MG PO TABS  Take 1 tab po 72 hours after first tablet repeat in 72 hours. 2 tablet 0  . METRONIDAZOLE 500 MG PO TABS Oral Take 1 tablet (500 mg total) by mouth 2 (two) times daily. 14 tablet 0  . PRENATAL MULTIVITAMIN CH Oral Take 1 tablet by mouth daily. At breakfast    . PROMETHAZINE HCL 25 MG PO TABS Oral Take 1 tablet (25 mg total) by mouth every 6 (six) hours as needed for nausea. May take 1/2 tablet for milder symptoms.  Sedation precautions. 30 tablet 0    BP 110/62  Pulse 124  Temp(Src) 98.8 F (37.1 C) (Oral)  Resp 20  SpO2 98%  LMP 07/29/2011  Physical Exam  Nursing note and vitals reviewed. Constitutional: She is oriented to person, place, and time. She appears well-developed and well-nourished. No distress.  HENT:  Head: Normocephalic and atraumatic.  Eyes: Conjunctivae are normal. No scleral icterus.  Neck: Neck supple.  Cardiovascular: Normal heart sounds.   Pulmonary/Chest: Breath sounds normal.  Abdominal: Soft. There is no tenderness.       pregnant  Genitourinary:       Vulvo vaginal erythema. Vaginal swelling and erythema, copious white creamy vaginal discharge. No  ulcers or warts. No vaginal bleeding.  Cervix visually close. Long and high. Gravid uterus with no tenderness or contractions. No tenderness to cervical motion.  Adnexa no tender or palpable.  Lymphadenopathy:    She has no cervical adenopathy.  Neurological: She is alert and oriented to person, place, and time.  Skin: No rash noted.    ED Course  Procedures (including critical care time)  Labs Reviewed  WET PREP, GENITAL - Abnormal; Notable for the following:    Yeast Wet Prep HPF POC FEW (*)    Clue Cells Wet Prep HPF POC FEW (*)    All other components within normal limits   GC/CHLAMYDIA PROBE AMP, GENITAL  LAB REPORT - SCANNED   No results found.   1. Vaginitis and vulvovaginitis       MDM  Significant vulvovaginitis. Patient already had diflucan 100mg  x1 refilled diflucan to use every 72h for 2 times more. BV was treated at the request of patient. Prescribed flagyl bid for 7 days.         Sharin Grave, MD 02/25/12 4132

## 2012-02-23 NOTE — ED Notes (Signed)
GC/Chlamydia neg., Wet prep: few yeast, few clue cells.  Pt. adequately treated with Diflucan and Flagyl. Vassie Moselle 02/23/2012

## 2012-04-01 ENCOUNTER — Inpatient Hospital Stay (HOSPITAL_COMMUNITY)
Admission: AD | Admit: 2012-04-01 | Discharge: 2012-04-02 | Disposition: A | Discharge status: Home / Self Care | Payer: Medicaid Other | Source: Ambulatory Visit | Attending: Obstetrics & Gynecology | Admitting: Obstetrics & Gynecology

## 2012-04-01 ENCOUNTER — Encounter (HOSPITAL_COMMUNITY): Payer: Self-pay | Admitting: *Deleted

## 2012-04-01 DIAGNOSIS — O2390 Unspecified genitourinary tract infection in pregnancy, unspecified trimester: Secondary | ICD-10-CM | POA: Insufficient documentation

## 2012-04-01 DIAGNOSIS — B379 Candidiasis, unspecified: Secondary | ICD-10-CM

## 2012-04-01 DIAGNOSIS — Z3490 Encounter for supervision of normal pregnancy, unspecified, unspecified trimester: Secondary | ICD-10-CM

## 2012-04-01 DIAGNOSIS — B372 Candidiasis of skin and nail: Secondary | ICD-10-CM | POA: Insufficient documentation

## 2012-04-01 NOTE — MAU Note (Signed)
Pt G8 P3 at 35.2wks.  Bump on groin area after shaving that is painful.  Rash under both breast that is itchy and burns.  Left foot and ankle swollen.

## 2012-04-02 LAB — GLUCOSE, CAPILLARY: Glucose-Capillary: 128 mg/dL — ABNORMAL HIGH (ref 70–99)

## 2012-04-02 MED ORDER — NYSTATIN 100000 UNIT/GM EX POWD: Freq: Four times a day (QID) | CUTANEOUS | Status: DC

## 2012-04-02 NOTE — MAU Provider Note (Signed)
  History     CSN: [id number]  Arrival date and time: 04/01/12 2202   First Provider Initiated Contact with Patient 04/02/12 0000      Chief Complaint  Patient presents with  . Rash   HPI: [name] [name] is a 26 y.o. female @ [redacted]w[redacted]d gestation who presents to MAU for rash. The rash has been there off and on for several days. She states she get wet under her breast and in the folds of her skin when she gets hot. The rash is located under her breasts and in the folds of her thighs. She complains of itching. She has no OB complaints. She is getting regular prenatal care with [facility]. The history was provided by the patient.  OB History    Grav Para Term Preterm Abortions TAB SAB Ect Mult Living   8 3 3  0 4 4 0 0 0 3      Past Medical History  Diagnosis Date  . No pertinent past medical history   . Pregnant     Past Surgical History  Procedure Date  . Dilation and curettage of uterus   . Wisdom tooth extraction     Family History  Problem Relation Age of Onset  . Anesthesia problems Neg Hx     History  Substance Use Topics  . Smoking status: Never Smoker   . Smokeless tobacco: Never Used  . Alcohol Use: No    Allergies:  Allergies  Allergen Reactions  . Tamiflu Rash    Prescriptions prior to admission  Medication Sig Dispense Refill  . acetaminophen (TYLENOL) 500 MG tablet Take 500-1,000 mg by mouth every 6 (six) hours as needed. Head aches       . fluconazole (DIFLUCAN) 100 MG tablet Take 1 tab po 72 hours after first tablet repeat in 72 hours.  2 tablet  0  . Prenatal Vit-Fe Fumarate-FA (PRENATAL MULTIVITAMIN) TABS Take 1 tablet by mouth daily. At breakfast      . promethazine (PHENERGAN) 25 MG tablet Take 1 tablet (25 mg total) by mouth every 6 (six) hours as needed for nausea. May take 1/2 tablet for milder symptoms.  Sedation precautions.  30 tablet  0    ROS: As stated in HPI Physical Exam   Blood pressure 126/76, pulse 109, temperature 98.4 F (36.9 C),  temperature source Oral, resp. rate 18, height 4\' 11"  (1.499 m), weight 170 lb (77.111 kg), last menstrual period 07/29/2011.  Physical Exam  Constitutional: She is oriented to person, place, and time. She appears well-developed and well-nourished. No distress.  HENT:  Head: Normocephalic and atraumatic.  Eyes: EOM are normal.  Neck: Neck supple.  Cardiovascular: Normal rate.   Respiratory: Effort normal.  GI: Soft. There is no tenderness.  Genitourinary: Vagina normal.  Musculoskeletal: Normal range of motion.  Neurological: She is alert and oriented to person, place, and time.  Skin:       Area of erythema under breast bilateral and bilateral inguinal area.   Psychiatric: She has a normal mood and affect. Her behavior is normal. Judgment and thought content normal.   EFM: Reactive tracing, no contractions MAU Course  Procedures   Assessment and Plan  25 y.o. female @ [redacted]w[redacted]d with Monilia   Plan: Rx Nystatin powder  Tomi Grandpre 04/02/2012, 12:09 AM

## 2012-04-10 LAB — OB RESULTS CONSOLE GBS: GBS: POSITIVE

## 2012-04-24 ENCOUNTER — Inpatient Hospital Stay (HOSPITAL_COMMUNITY)
Admission: AD | Admit: 2012-04-24 | Discharge: 2012-04-25 | Disposition: A | Discharge status: Home / Self Care | Payer: Medicaid Other | Source: Ambulatory Visit | Attending: Obstetrics | Admitting: Obstetrics

## 2012-04-24 ENCOUNTER — Telehealth (HOSPITAL_COMMUNITY): Payer: Self-pay | Admitting: *Deleted

## 2012-04-24 ENCOUNTER — Encounter (HOSPITAL_COMMUNITY): Payer: Self-pay | Admitting: *Deleted

## 2012-04-24 DIAGNOSIS — O479 False labor, unspecified: Secondary | ICD-10-CM | POA: Insufficient documentation

## 2012-04-24 NOTE — Telephone Encounter (Signed)
Preadmission screen  

## 2012-04-24 NOTE — MAU Note (Signed)
SAYS HURT BAD AT 9PM.   VE IN OFFICE - 2 CM.Marland Kitchen    DENIES HSV AND MRSA.

## 2012-04-25 NOTE — MAU Note (Signed)
Pt declines any medication states she "can go home and sleep."

## 2012-04-25 NOTE — MAU Note (Signed)
Dr. Harper notified of pt.  Orders rec'd. 

## 2012-05-03 ENCOUNTER — Telehealth (HOSPITAL_COMMUNITY): Payer: Self-pay | Admitting: *Deleted

## 2012-05-03 NOTE — Telephone Encounter (Signed)
Preadmission screen  

## 2012-05-04 ENCOUNTER — Encounter (HOSPITAL_COMMUNITY): Payer: Self-pay | Admitting: *Deleted

## 2012-05-04 ENCOUNTER — Inpatient Hospital Stay (HOSPITAL_COMMUNITY)
Admission: AD | Admit: 2012-05-04 | Discharge: 2012-05-04 | Disposition: A | Discharge status: Home / Self Care | Payer: Medicaid Other | Source: Ambulatory Visit | Attending: Obstetrics & Gynecology | Admitting: Obstetrics & Gynecology

## 2012-05-04 DIAGNOSIS — O479 False labor, unspecified: Secondary | ICD-10-CM | POA: Insufficient documentation

## 2012-05-04 DIAGNOSIS — O471 False labor at or after 37 completed weeks of gestation: Secondary | ICD-10-CM

## 2012-05-04 LAB — URINALYSIS, ROUTINE W REFLEX MICROSCOPIC
Glucose, UA: 250 mg/dL — AB
Hgb urine dipstick: NEGATIVE
Ketones, ur: NEGATIVE mg/dL
Protein, ur: NEGATIVE mg/dL

## 2012-05-04 MED ORDER — ZOLPIDEM TARTRATE 5 MG PO TABS: 5.0000 mg | ORAL_TABLET | Freq: Every evening | ORAL | Status: DC | PRN

## 2012-05-04 NOTE — MAU Note (Signed)
Staring last night having pains in side and back. Had some increase in blood pressure and was seeing spots.

## 2012-05-04 NOTE — MAU Provider Note (Signed)
History     CSN: [id number]  Arrival date and time: 05/04/12 8295   First Provider Initiated Contact with Patient 05/04/12 2028      Chief Complaint  Patient presents with  . Back Pain  . Hip Pain  . Hypertension    seeing spots   HPI [name] [name] 26 y.o. [redacted]w[redacted]d Comes to MAU with contractions, hip pain, and low back pain.  Reports her blood pressure was elevated yesterday.  Also describes floaters in her eyes - white spots that float downward in her vision.  Is scheduled for version on Monday for transverse presentation and for C/S on Tuesday if version fails.  Today is having low back pain with walking and is having to walk very slowly.  Has taken Tylenol by mouth today but no relief from backache.  Teary.  OB History    Grav Para Term Preterm Abortions TAB SAB Ect Mult Living   9 3 3  0 5 4 1  0 0 3      Past Medical History  Diagnosis Date  . No pertinent past medical history   . Pregnant     Past Surgical History  Procedure Date  . Dilation and curettage of uterus   . Wisdom tooth extraction     Family History  Problem Relation Age of Onset  . Anesthesia problems Neg Hx   . Other Neg Hx     History  Substance Use Topics  . Smoking status: Never Smoker   . Smokeless tobacco: Never Used  . Alcohol Use: No    Allergies:  Allergies  Allergen Reactions  . Tamiflu Rash    Prescriptions prior to admission  Medication Sig Dispense Refill  . acetaminophen (TYLENOL) 500 MG tablet Take 500-1,000 mg by mouth every 6 (six) hours as needed. Head aches       . Prenatal Vit-Fe Fumarate-FA (PRENATAL MULTIVITAMIN) TABS Take 1 tablet by mouth daily. At breakfast        ROS Physical Exam   Blood pressure 123/84, pulse 109, temperature 97.7 F (36.5 C), temperature source Oral, resp. rate 20, height 4' 9.99" (1.473 m), weight 176 lb 9.6 oz (80.105 kg), last menstrual period 07/29/2011.  Physical Exam  Nursing note and vitals reviewed. Constitutional: She is  oriented to person, place, and time. She appears well-developed and well-nourished.  HENT:  Head: Normocephalic.  Eyes: EOM are normal.  Neck: Neck supple.  GI: Soft.       On monitor, having contractions - 2-6 minutes.  FHT baseline 140.  Strip is reactive.  Genitourinary:       Vaginal exam by RN - 2 cm, 70 %  Musculoskeletal: Normal range of motion.  Neurological: She is alert and oriented to person, place, and time.  Skin: Skin is warm and dry.  Psychiatric: She has a normal mood and affect.  Blood pressures all normal today.  MAU Course  Procedures Results for orders placed during the hospital encounter of 05/04/12 (from the past 24 hour(s))  URINALYSIS, ROUTINE W REFLEX MICROSCOPIC     Status: Abnormal   Collection Time   05/04/12  7:25 PM      Component Value Range   Color, Urine YELLOW  YELLOW   APPearance CLEAR  CLEAR   Specific Gravity, Urine 1.025  1.005 - 1.030   pH 6.0  5.0 - 8.0   Glucose, UA 250 (*) NEGATIVE mg/dL   Hgb urine dipstick NEGATIVE  NEGATIVE   Bilirubin Urine NEGATIVE  NEGATIVE  Ketones, ur NEGATIVE  NEGATIVE mg/dL   Protein, ur NEGATIVE  NEGATIVE mg/dL   Urobilinogen, UA 0.2  0.0 - 1.0 mg/dL   Nitrite NEGATIVE  NEGATIVE   Leukocytes, UA NEGATIVE  NEGATIVE   MDM 2100  Consult with Dr. Tamela Oddi re: plan of care  Assessment and Plan  False labor at 40 weeks Late pregnancy discomfort  Plan Will discharge home Rx ambien 5 mg one po at HS (#3) no refills Kick counts Discussed nonpharmacologic pain management for back pain. Keep appointments as scheduled.  Sid Greener 05/04/2012, 8:34 PM

## 2012-05-07 ENCOUNTER — Inpatient Hospital Stay (HOSPITAL_COMMUNITY)
Admission: RE | Admit: 2012-05-07 | Discharge: 2012-05-12 | DRG: 766 | Disposition: A | Discharge status: Home / Self Care | Payer: Medicaid Other | Source: Ambulatory Visit | Attending: Obstetrics | Admitting: Obstetrics

## 2012-05-07 ENCOUNTER — Encounter (HOSPITAL_COMMUNITY): Payer: Self-pay

## 2012-05-07 VITALS — BP 129/88 | HR 83 | Temp 98.0°F | Resp 20 | Ht 59.0 in | Wt 176.0 lb

## 2012-05-07 DIAGNOSIS — Z22330 Carrier of Group B streptococcus: Secondary | ICD-10-CM

## 2012-05-07 DIAGNOSIS — O324XX0 Maternal care for high head at term, not applicable or unspecified: Secondary | ICD-10-CM | POA: Diagnosis present

## 2012-05-07 DIAGNOSIS — O480 Post-term pregnancy: Principal | ICD-10-CM | POA: Diagnosis present

## 2012-05-07 DIAGNOSIS — O329XX0 Maternal care for malpresentation of fetus, unspecified, not applicable or unspecified: Secondary | ICD-10-CM

## 2012-05-07 DIAGNOSIS — O99892 Other specified diseases and conditions complicating childbirth: Secondary | ICD-10-CM | POA: Diagnosis present

## 2012-05-07 DIAGNOSIS — Z98891 History of uterine scar from previous surgery: Secondary | ICD-10-CM

## 2012-05-07 DIAGNOSIS — T8189XA Other complications of procedures, not elsewhere classified, initial encounter: Secondary | ICD-10-CM | POA: Diagnosis not present

## 2012-05-07 LAB — CBC
HCT: 38.3 % (ref 36.0–46.0)
Hemoglobin: 12.5 g/dL (ref 12.0–15.0)
MCH: 30.6 pg (ref 26.0–34.0)
MCHC: 32.6 g/dL (ref 30.0–36.0)
RDW: 14.2 % (ref 11.5–15.5)

## 2012-05-07 MED ORDER — LACTATED RINGERS IV SOLN: 500.0000 mL | INTRAVENOUS | Status: DC | PRN

## 2012-05-07 MED ORDER — PENICILLIN G POTASSIUM 5000000 UNITS IJ SOLR
5.0000 10*6.[IU] | Freq: Once | INTRAVENOUS | Status: AC
  Administered 2012-05-07: 5 10*6.[IU] via INTRAVENOUS
  Filled 2012-05-07 (×2): qty 5

## 2012-05-07 MED ORDER — PENICILLIN G POTASSIUM 5000000 UNITS IJ SOLR
2.5000 10*6.[IU] | INTRAVENOUS | Status: DC
  Administered 2012-05-07 (×2): 2.5 10*6.[IU] via INTRAVENOUS
  Filled 2012-05-07 (×5): qty 2.5

## 2012-05-07 MED ORDER — FLEET ENEMA 7-19 GM/118ML RE ENEM: 1.0000 | ENEMA | RECTAL | Status: DC | PRN

## 2012-05-07 MED ORDER — TERBUTALINE SULFATE 1 MG/ML IJ SOLN: 0.2500 mg | Freq: Once | INTRAMUSCULAR | Status: AC | PRN

## 2012-05-07 MED ORDER — MISOPROSTOL 25 MCG QUARTER TABLET: 25.0000 ug | ORAL_TABLET | ORAL | Status: DC

## 2012-05-07 MED ORDER — OXYTOCIN BOLUS FROM INFUSION
250.0000 mL | Freq: Once | INTRAVENOUS | Status: DC
  Filled 2012-05-07: qty 500

## 2012-05-07 MED ORDER — NALBUPHINE SYRINGE 5 MG/0.5 ML
10.0000 mg | INJECTION | INTRAMUSCULAR | Status: DC | PRN
  Administered 2012-05-07: 10 mg via INTRAVENOUS
  Filled 2012-05-07: qty 1

## 2012-05-07 MED ORDER — MISOPROSTOL 25 MCG QUARTER TABLET
25.0000 ug | ORAL_TABLET | ORAL | Status: DC
Start: 2012-05-08 — End: 2012-05-09
  Administered 2012-05-08: 25 ug via VAGINAL
  Filled 2012-05-07: qty 1
  Filled 2012-05-07: qty 0.25
  Filled 2012-05-07 (×5): qty 1

## 2012-05-07 MED ORDER — LACTATED RINGERS IV SOLN
INTRAVENOUS | Status: DC
  Administered 2012-05-07: 125 mL via INTRAVENOUS
  Administered 2012-05-08 (×2): via INTRAVENOUS

## 2012-05-07 MED ORDER — ONDANSETRON HCL 4 MG/2ML IJ SOLN: 4.0000 mg | Freq: Four times a day (QID) | INTRAMUSCULAR | Status: DC | PRN

## 2012-05-07 MED ORDER — IBUPROFEN 600 MG PO TABS: 600.0000 mg | ORAL_TABLET | Freq: Four times a day (QID) | ORAL | Status: DC | PRN

## 2012-05-07 MED ORDER — LIDOCAINE HCL (PF) 1 % IJ SOLN
30.0000 mL | INTRAMUSCULAR | Status: DC | PRN
  Filled 2012-05-07: qty 30

## 2012-05-07 MED ORDER — PROMETHAZINE HCL 25 MG/ML IJ SOLN: 25.0000 mg | Freq: Four times a day (QID) | INTRAMUSCULAR | Status: DC | PRN

## 2012-05-07 MED ORDER — CITRIC ACID-SODIUM CITRATE 334-500 MG/5ML PO SOLN
30.0000 mL | ORAL | Status: DC | PRN
  Administered 2012-05-09: 30 mL via ORAL
  Filled 2012-05-07: qty 15

## 2012-05-07 MED ORDER — OXYTOCIN 40 UNITS IN LACTATED RINGERS INFUSION - SIMPLE MED
62.5000 mL/h | Freq: Once | INTRAVENOUS | Status: DC
  Filled 2012-05-07: qty 1000

## 2012-05-07 MED ORDER — ACETAMINOPHEN 325 MG PO TABS
650.0000 mg | ORAL_TABLET | ORAL | Status: DC | PRN
  Administered 2012-05-07 – 2012-05-08 (×4): 650 mg via ORAL
  Filled 2012-05-07 (×4): qty 2

## 2012-05-07 MED ORDER — NALBUPHINE HCL 10 MG/ML IJ SOLN
10.0000 mg | Freq: Four times a day (QID) | INTRAMUSCULAR | Status: DC | PRN
  Filled 2012-05-07: qty 1

## 2012-05-07 MED ORDER — OXYTOCIN 40 UNITS IN LACTATED RINGERS INFUSION - SIMPLE MED
1.0000 m[IU]/min | INTRAVENOUS | Status: DC
  Administered 2012-05-07: 2 m[IU]/min via INTRAVENOUS
  Filled 2012-05-07: qty 1000

## 2012-05-07 MED ORDER — PENICILLIN G POTASSIUM 5000000 UNITS IJ SOLR
2.5000 10*6.[IU] | INTRAVENOUS | Status: DC
  Administered 2012-05-08 – 2012-05-09 (×7): 2.5 10*6.[IU] via INTRAVENOUS
  Filled 2012-05-07 (×10): qty 2.5

## 2012-05-07 MED ORDER — OXYCODONE-ACETAMINOPHEN 5-325 MG PO TABS: 1.0000 | ORAL_TABLET | ORAL | Status: DC | PRN

## 2012-05-07 NOTE — Progress Notes (Signed)
Discontinue pitocin and allow patient to eat and shower if desired. Place cytotec at 0200 on 8/20

## 2012-05-07 NOTE — Progress Notes (Signed)
[name] [name] is a 26 y.o. [id number] at [redacted]w[redacted]d by LMP admitted for induction of labor due to Post dates. Due date 05-04-12.  Subjective:   Objective: BP 118/74  Pulse 101  Temp 98.5 F (36.9 C) (Oral)  Resp 18  Ht 4\' 11"  (1.499 m)  Wt 79.833 kg (176 lb)  BMI 35.55 kg/m2  LMP 07/29/2011      FHT:  FHR: 140-150 bpm, variability: moderate,  accelerations:  Present,  decelerations:  Absent UC:   Irregular, mild. SVE:   Dilation: 2.5 Effacement (%): 70;80 Station: -3;Ballotable Exam by:: Christy Goodnight,RN   Labs: Lab Results  Component Value Date   WBC 10.9* 09/17/2011   HGB 13.9 09/17/2011   HCT 41.0 09/17/2011   MCV 93.0 09/17/2011   PLT 288 09/17/2011    Assessment / Plan: Postdates.  Multiparity.  Favorable cervix.  Pitocin IOL, low dose protocol.  Labor: Latent phase Preeclampsia:  n/a Fetal Wellbeing:  Category I Pain Control:  Epidural and Nubain in early labor. I/D:  n/a Anticipated MOD:  NSVD  [name] [name] A 05/07/2012, 9:17 AM

## 2012-05-07 NOTE — H&P (Signed)
[name] [name] is a 26 y.o. female presenting for version for oblique lie. Maternal Medical History:  Reason for admission: 26 yo G9 P91.  EDC 05-04-12.  H/O oblique lie on ultrasound last week.  Presents for external cephalic version and IOL.  Fetal activity: Perceived fetal activity is normal.   Last perceived fetal movement was within the past hour.    Prenatal complications: no prenatal complications Prenatal Complications - Diabetes: none.    OB History    Grav Para Term Preterm Abortions TAB SAB Ect Mult Living   9 3 3  0 5 4 1  0 0 3     Past Medical History  Diagnosis Date  . No pertinent past medical history   . Pregnant    Past Surgical History  Procedure Date  . Dilation and curettage of uterus   . Wisdom tooth extraction    Family History: family history is negative for Anesthesia problems and Other. Social History:  reports that she has never smoked. She has never used smokeless tobacco. She reports that she does not drink alcohol or use illicit drugs.   Prenatal Transfer Tool  Maternal Diabetes: No Genetic Screening: Normal Maternal Ultrasounds/Referrals: Normal Fetal Ultrasounds or other Referrals:  None Maternal Substance Abuse:  No Significant Maternal Medications:  Meds include: Other: see prenatal record Significant Maternal Lab Results:  Lab values include: Group B Strep positive Other Comments:  None  Review of Systems  Musculoskeletal: Positive for back pain.  Psychiatric/Behavioral: The patient is nervous/anxious.   All other systems reviewed and are negative.      Blood pressure 118/74, pulse 101, temperature 98.5 F (36.9 C), temperature source Oral, resp. rate 18, height 4\' 11"  (1.499 m), weight 79.833 kg (176 lb), last menstrual period 07/29/2011. Maternal Exam:  Abdomen: Patient reports no abdominal tenderness. Fetal presentation: vertex  Introitus: Normal vulva. Normal vagina.  Pelvis: adequate for delivery.   Cervix: Cervix evaluated  by digital exam.    Informal ultrasound:  Cephalic presentation. Physical Exam  Nursing note and vitals reviewed. Constitutional: She is oriented to person, place, and time. She appears well-developed and well-nourished.  HENT:  Head: Normocephalic and atraumatic.  Eyes: Conjunctivae are normal. Pupils are equal, round, and reactive to light.  Neck: Normal range of motion. Neck supple.  Cardiovascular: Normal rate and regular rhythm.   Respiratory: Effort normal.  GI: Soft.  Genitourinary: Vagina normal and uterus normal.  Musculoskeletal: Normal range of motion.  Neurological: She is alert and oriented to person, place, and time.  Skin: Skin is warm and dry.  Psychiatric: She has a normal mood and affect. Her behavior is normal. Thought content normal.    Prenatal labs: ABO, Rh: --/Positive/-- (01/15 0000) Antibody:   Rubella: Immune (01/15 0000) RPR: Nonreactive (01/15 0000)  HBsAg: Negative (01/15 0000)  HIV: Non-reactive (01/15 0000)  GBS: Positive (07/23 0000)   Assessment/Plan: 40.3 weeks.  Previous oblique lie, now vertex presentation.  Multiparity.  Favorable cervix.   Will proceed with IOL.  Start low dose pitocin per protocol.   Trystin Hargrove A 05/07/2012, 8:35 AM

## 2012-05-08 MED ORDER — TERBUTALINE SULFATE 1 MG/ML IJ SOLN: 0.2500 mg | Freq: Once | INTRAMUSCULAR | Status: AC | PRN

## 2012-05-08 MED ORDER — FENTANYL 2.5 MCG/ML BUPIVACAINE 1/10 % EPIDURAL INFUSION (WH - ANES)
14.0000 mL/h | INTRAMUSCULAR | Status: DC
  Administered 2012-05-08: 12 mL/h via EPIDURAL
  Administered 2012-05-08 – 2012-05-09 (×5): 14 mL/h via EPIDURAL
  Filled 2012-05-08 (×6): qty 60

## 2012-05-08 MED ORDER — PHENYLEPHRINE 40 MCG/ML (10ML) SYRINGE FOR IV PUSH (FOR BLOOD PRESSURE SUPPORT): 80.0000 ug | PREFILLED_SYRINGE | INTRAVENOUS | Status: DC | PRN

## 2012-05-08 MED ORDER — LIDOCAINE HCL (PF) 1 % IJ SOLN
INTRAMUSCULAR | Status: DC | PRN
  Administered 2012-05-08 (×3): 4 mL

## 2012-05-08 MED ORDER — EPHEDRINE 5 MG/ML INJ: 10.0000 mg | INTRAVENOUS | Status: DC | PRN

## 2012-05-08 MED ORDER — DIPHENHYDRAMINE HCL 50 MG/ML IJ SOLN: 12.5000 mg | INTRAMUSCULAR | Status: DC | PRN

## 2012-05-08 MED ORDER — LACTATED RINGERS IV SOLN
500.0000 mL | Freq: Once | INTRAVENOUS | Status: AC
  Administered 2012-05-08: 1000 mL via INTRAVENOUS

## 2012-05-08 MED ORDER — EPHEDRINE 5 MG/ML INJ
10.0000 mg | INTRAVENOUS | Status: DC | PRN
  Filled 2012-05-08: qty 4

## 2012-05-08 MED ORDER — OXYTOCIN 40 UNITS IN LACTATED RINGERS INFUSION - SIMPLE MED
1.0000 m[IU]/min | INTRAVENOUS | Status: DC
  Administered 2012-05-08: 1 m[IU]/min via INTRAVENOUS

## 2012-05-08 MED ORDER — PHENYLEPHRINE 40 MCG/ML (10ML) SYRINGE FOR IV PUSH (FOR BLOOD PRESSURE SUPPORT)
80.0000 ug | PREFILLED_SYRINGE | INTRAVENOUS | Status: DC | PRN
  Filled 2012-05-08: qty 5

## 2012-05-08 NOTE — Anesthesia Procedure Notes (Signed)
Epidural Patient location during procedure: OB Start time: 05/08/2012 1:20 PM Reason for block: procedure for pain  Staffing Performed by: anesthesiologist   Preanesthetic Checklist Completed: patient identified, site marked, surgical consent, pre-op evaluation, timeout performed, IV checked, risks and benefits discussed and monitors and equipment checked  Epidural Patient position: sitting Prep: site prepped and draped and DuraPrep Patient monitoring: continuous pulse ox and blood pressure Approach: midline Injection technique: LOR air  Needle:  Needle type: Tuohy  Needle gauge: 17 G Needle length: 9 cm Needle insertion depth: 5 cm cm Catheter type: closed end flexible Catheter size: 19 Gauge Catheter at skin depth: 10 cm Test dose: negative  Assessment Events: blood not aspirated, injection not painful, no injection resistance, negative IV test and no paresthesia  Additional Notes Discussed risk of headache, infection, bleeding, nerve injury and failed or incomplete block.  Patient voices understanding and wishes to proceed.

## 2012-05-08 NOTE — Progress Notes (Signed)
Anesthesia called about epidural infusion pump reading syringe not fitted. Anesthesia stated to replace infusion pump,.

## 2012-05-08 NOTE — Progress Notes (Signed)
MD stated that he is not worried about late decels.  RN notified MD of cervical exam and MD stated that he wants to continue the same plan of care

## 2012-05-08 NOTE — Progress Notes (Signed)
Anesthesia called again for infusion pump reading syringe not fitted on new pump.  Anestheisa stated that he will come and assess pump.

## 2012-05-08 NOTE — Progress Notes (Signed)
[name] [name] is a 26 y.o. [id number] at [redacted]w[redacted]d by LMP admitted for induction of labor due to Post dates. Due date 05-04-12.  Subjective:   Objective: BP 117/79  Pulse 86  Temp 97.7 F (36.5 C) (Oral)  Resp 18  Ht 4\' 11"  (1.499 m)  Wt 79.833 kg (176 lb)  BMI 35.55 kg/m2  LMP 07/29/2011      FHT:  FHR: 150 bpm, variability: moderate,  accelerations:  Present,  decelerations:  Absent UC:   Irregular, mild. SVE:   Dilation: 2.5 Effacement (%): 70 Station: -3 Exam by:: [employer] RN  Labs: Lab Results  Component Value Date   WBC 6.8 05/07/2012   HGB 12.5 05/07/2012   HCT 38.3 05/07/2012   MCV 93.9 05/07/2012   PLT 258 05/07/2012    Assessment / Plan: Induction of labor due to postterm,  progressing well on pitocin  Labor: Latent phase Preeclampsia:  n/a Fetal Wellbeing:  Category I Pain Control:  Nubain I/D:  n/a Anticipated MOD:  NSVD  [name] [name] A 05/08/2012, 8:53 AM

## 2012-05-08 NOTE — Anesthesia Preprocedure Evaluation (Signed)
Anesthesia Evaluation  Patient identified by MRN, date of birth, ID band Patient awake    Reviewed: Allergy & Precautions, H&P , NPO status , Patient's Chart, lab work & pertinent test results, reviewed documented beta blocker date and time   History of Anesthesia Complications Negative for: history of anesthetic complications  Airway Mallampati: III  TM Distance: >3 FB Neck ROM: full    Dental  (+) Teeth Intact   Pulmonary neg pulmonary ROS,  breath sounds clear to auscultation        Cardiovascular negative cardio ROS  Rhythm:regular Rate:Normal     Neuro/Psych negative neurological ROS  negative psych ROS   GI/Hepatic negative GI ROS, Neg liver ROS,   Endo/Other  negative endocrine ROS  Renal/GU negative Renal ROS     Musculoskeletal   Abdominal   Peds  Hematology negative hematology ROS (+)   Anesthesia Other Findings   Reproductive/Obstetrics (+) Pregnancy                            Anesthesia Physical Anesthesia Plan  ASA: II  Anesthesia Plan: Epidural   Post-op Pain Management:    Induction:   Airway Management Planned:   Additional Equipment:   Intra-op Plan:   Post-operative Plan:   Informed Consent: I have reviewed the patients History and Physical, chart, labs and discussed the procedure including the risks, benefits and alternatives for the proposed anesthesia with the patient or authorized representative who has indicated his/her understanding and acceptance.     Plan Discussed with:   Anesthesia Plan Comments:         Anesthesia Quick Evaluation  

## 2012-05-09 ENCOUNTER — Encounter (HOSPITAL_COMMUNITY)
Admission: RE | Disposition: A | Discharge status: Home / Self Care | Payer: Self-pay | Source: Ambulatory Visit | Attending: Obstetrics

## 2012-05-09 ENCOUNTER — Encounter (HOSPITAL_COMMUNITY): Payer: Self-pay | Admitting: Anesthesiology

## 2012-05-09 ENCOUNTER — Encounter (HOSPITAL_COMMUNITY): Payer: Self-pay

## 2012-05-09 ENCOUNTER — Inpatient Hospital Stay (HOSPITAL_COMMUNITY): Payer: Medicaid Other | Admitting: Anesthesiology

## 2012-05-09 DIAGNOSIS — O329XX0 Maternal care for malpresentation of fetus, unspecified, not applicable or unspecified: Secondary | ICD-10-CM | POA: Clinically undetermined

## 2012-05-09 DIAGNOSIS — T8189XA Other complications of procedures, not elsewhere classified, initial encounter: Secondary | ICD-10-CM | POA: Diagnosis not present

## 2012-05-09 SURGERY — Surgical Case
Anesthesia: Epidural | Site: Abdomen | Wound class: Clean Contaminated

## 2012-05-09 MED ORDER — PHENYLEPHRINE 40 MCG/ML (10ML) SYRINGE FOR IV PUSH (FOR BLOOD PRESSURE SUPPORT)
PREFILLED_SYRINGE | INTRAVENOUS | Status: AC
  Filled 2012-05-09: qty 5

## 2012-05-09 MED ORDER — LIDOCAINE HCL (CARDIAC) 20 MG/ML IV SOLN
INTRAVENOUS | Status: AC
  Filled 2012-05-09: qty 5

## 2012-05-09 MED ORDER — NALBUPHINE HCL 10 MG/ML IJ SOLN
5.0000 mg | INTRAMUSCULAR | Status: DC | PRN
  Filled 2012-05-09: qty 1

## 2012-05-09 MED ORDER — MEPERIDINE HCL 25 MG/ML IJ SOLN: 6.2500 mg | INTRAMUSCULAR | Status: DC | PRN

## 2012-05-09 MED ORDER — FENTANYL CITRATE 0.05 MG/ML IJ SOLN
INTRAMUSCULAR | Status: AC
  Filled 2012-05-09: qty 2

## 2012-05-09 MED ORDER — SCOPOLAMINE 1 MG/3DAYS TD PT72
MEDICATED_PATCH | TRANSDERMAL | Status: AC
  Administered 2012-05-09: 1.5 mg via TRANSDERMAL
  Filled 2012-05-09: qty 1

## 2012-05-09 MED ORDER — CEFAZOLIN SODIUM-DEXTROSE 2-3 GM-% IV SOLR
2.0000 g | Freq: Once | INTRAVENOUS | Status: DC
  Filled 2012-05-09: qty 50

## 2012-05-09 MED ORDER — SODIUM BICARBONATE 8.4 % IV SOLN
INTRAVENOUS | Status: AC
  Filled 2012-05-09: qty 50

## 2012-05-09 MED ORDER — KETOROLAC TROMETHAMINE 60 MG/2ML IM SOLN
INTRAMUSCULAR | Status: AC
  Administered 2012-05-09: 60 mg via INTRAMUSCULAR
  Filled 2012-05-09: qty 2

## 2012-05-09 MED ORDER — METOCLOPRAMIDE HCL 5 MG/ML IJ SOLN: 10.0000 mg | Freq: Three times a day (TID) | INTRAMUSCULAR | Status: DC | PRN

## 2012-05-09 MED ORDER — IBUPROFEN 600 MG PO TABS
600.0000 mg | ORAL_TABLET | Freq: Four times a day (QID) | ORAL | Status: DC
  Administered 2012-05-10 – 2012-05-12 (×10): 600 mg via ORAL
  Filled 2012-05-09 (×2): qty 1

## 2012-05-09 MED ORDER — NALOXONE HCL 0.4 MG/ML IJ SOLN: 0.4000 mg | INTRAMUSCULAR | Status: DC | PRN

## 2012-05-09 MED ORDER — FENTANYL CITRATE 0.05 MG/ML IJ SOLN
INTRAMUSCULAR | Status: DC | PRN
  Administered 2012-05-09 (×2): 100 ug via INTRAVENOUS

## 2012-05-09 MED ORDER — MAGNESIUM HYDROXIDE 400 MG/5ML PO SUSP: 30.0000 mL | ORAL | Status: DC | PRN

## 2012-05-09 MED ORDER — SCOPOLAMINE 1 MG/3DAYS TD PT72
1.0000 | MEDICATED_PATCH | Freq: Once | TRANSDERMAL | Status: DC
  Administered 2012-05-09: 1.5 mg via TRANSDERMAL

## 2012-05-09 MED ORDER — DIPHENHYDRAMINE HCL 25 MG PO CAPS: 25.0000 mg | ORAL_CAPSULE | Freq: Four times a day (QID) | ORAL | Status: DC | PRN

## 2012-05-09 MED ORDER — ONDANSETRON HCL 4 MG PO TABS: 4.0000 mg | ORAL_TABLET | ORAL | Status: DC | PRN

## 2012-05-09 MED ORDER — FERROUS SULFATE 325 (65 FE) MG PO TABS
325.0000 mg | ORAL_TABLET | Freq: Two times a day (BID) | ORAL | Status: DC
  Administered 2012-05-10 – 2012-05-11 (×4): 325 mg via ORAL
  Filled 2012-05-09 (×5): qty 1

## 2012-05-09 MED ORDER — KETOROLAC TROMETHAMINE 60 MG/2ML IM SOLN
60.0000 mg | Freq: Once | INTRAMUSCULAR | Status: AC | PRN
  Administered 2012-05-09: 60 mg via INTRAMUSCULAR

## 2012-05-09 MED ORDER — MORPHINE SULFATE 0.5 MG/ML IJ SOLN
INTRAMUSCULAR | Status: AC
  Filled 2012-05-09: qty 10

## 2012-05-09 MED ORDER — SIMETHICONE 80 MG PO CHEW
80.0000 mg | CHEWABLE_TABLET | ORAL | Status: DC | PRN
  Administered 2012-05-09 – 2012-05-12 (×5): 80 mg via ORAL

## 2012-05-09 MED ORDER — KETOROLAC TROMETHAMINE 30 MG/ML IJ SOLN: 30.0000 mg | Freq: Four times a day (QID) | INTRAMUSCULAR | Status: AC | PRN

## 2012-05-09 MED ORDER — SENNOSIDES-DOCUSATE SODIUM 8.6-50 MG PO TABS
2.0000 | ORAL_TABLET | Freq: Every day | ORAL | Status: DC
  Administered 2012-05-09 – 2012-05-11 (×3): 2 via ORAL

## 2012-05-09 MED ORDER — EPHEDRINE 5 MG/ML INJ
INTRAVENOUS | Status: AC
  Filled 2012-05-09: qty 10

## 2012-05-09 MED ORDER — SODIUM CHLORIDE 0.9 % IV SOLN
1.0000 ug/kg/h | INTRAVENOUS | Status: DC | PRN
  Filled 2012-05-09: qty 2.5

## 2012-05-09 MED ORDER — LANOLIN HYDROUS EX OINT
1.0000 | TOPICAL_OINTMENT | CUTANEOUS | Status: DC | PRN
Start: 2012-05-09 — End: 2012-05-12

## 2012-05-09 MED ORDER — OXYCODONE-ACETAMINOPHEN 5-325 MG PO TABS
1.0000 | ORAL_TABLET | ORAL | Status: DC | PRN
  Administered 2012-05-10: 1 via ORAL
  Administered 2012-05-10 – 2012-05-12 (×7): 2 via ORAL
  Filled 2012-05-09: qty 2
  Filled 2012-05-09: qty 1
  Filled 2012-05-09: qty 2
  Filled 2012-05-09: qty 1
  Filled 2012-05-09 (×4): qty 2
  Filled 2012-05-09: qty 1

## 2012-05-09 MED ORDER — IBUPROFEN 600 MG PO TABS
600.0000 mg | ORAL_TABLET | Freq: Four times a day (QID) | ORAL | Status: DC | PRN
  Filled 2012-05-09 (×8): qty 1

## 2012-05-09 MED ORDER — OXYTOCIN 40 UNITS IN LACTATED RINGERS INFUSION - SIMPLE MED: 62.5000 mL/h | INTRAVENOUS | Status: AC

## 2012-05-09 MED ORDER — SODIUM BICARBONATE 8.4 % IV SOLN
INTRAVENOUS | Status: DC | PRN
  Administered 2012-05-09: 10 mL via EPIDURAL

## 2012-05-09 MED ORDER — ONDANSETRON HCL 4 MG/2ML IJ SOLN: 4.0000 mg | Freq: Three times a day (TID) | INTRAMUSCULAR | Status: DC | PRN

## 2012-05-09 MED ORDER — PRENATAL MULTIVITAMIN CH
1.0000 | ORAL_TABLET | Freq: Every day | ORAL | Status: DC
  Administered 2012-05-10 – 2012-05-12 (×3): 1 via ORAL
  Filled 2012-05-09 (×3): qty 1

## 2012-05-09 MED ORDER — TETANUS-DIPHTH-ACELL PERTUSSIS 5-2.5-18.5 LF-MCG/0.5 IM SUSP: 0.5000 mL | Freq: Once | INTRAMUSCULAR | Status: DC

## 2012-05-09 MED ORDER — OXYTOCIN 10 UNIT/ML IJ SOLN
INTRAMUSCULAR | Status: AC
  Filled 2012-05-09: qty 4

## 2012-05-09 MED ORDER — DIPHENHYDRAMINE HCL 50 MG/ML IJ SOLN: 12.5000 mg | INTRAMUSCULAR | Status: DC | PRN

## 2012-05-09 MED ORDER — LACTATED RINGERS IV SOLN
INTRAVENOUS | Status: DC | PRN
  Administered 2012-05-09: 12:00:00 via INTRAVENOUS

## 2012-05-09 MED ORDER — OXYTOCIN 10 UNIT/ML IJ SOLN
40.0000 [IU] | INTRAVENOUS | Status: DC | PRN
  Administered 2012-05-09: 40 [IU] via INTRAVENOUS

## 2012-05-09 MED ORDER — DIPHENHYDRAMINE HCL 50 MG/ML IJ SOLN: 25.0000 mg | INTRAMUSCULAR | Status: DC | PRN

## 2012-05-09 MED ORDER — LACTATED RINGERS IV SOLN
INTRAVENOUS | Status: DC | PRN
  Administered 2012-05-09 (×2): via INTRAVENOUS

## 2012-05-09 MED ORDER — ZOLPIDEM TARTRATE 5 MG PO TABS: 5.0000 mg | ORAL_TABLET | Freq: Every evening | ORAL | Status: DC | PRN

## 2012-05-09 MED ORDER — HYDROMORPHONE HCL PF 1 MG/ML IJ SOLN: 0.2500 mg | INTRAMUSCULAR | Status: DC | PRN

## 2012-05-09 MED ORDER — WITCH HAZEL-GLYCERIN EX PADS
1.0000 | MEDICATED_PAD | CUTANEOUS | Status: DC | PRN
Start: 2012-05-09 — End: 2012-05-12

## 2012-05-09 MED ORDER — ONDANSETRON HCL 4 MG/2ML IJ SOLN
INTRAMUSCULAR | Status: AC
  Filled 2012-05-09: qty 2

## 2012-05-09 MED ORDER — DIPHENHYDRAMINE HCL 25 MG PO CAPS: 25.0000 mg | ORAL_CAPSULE | ORAL | Status: DC | PRN

## 2012-05-09 MED ORDER — MEASLES, MUMPS & RUBELLA VAC ~~LOC~~ INJ: 0.5000 mL | INJECTION | Freq: Once | SUBCUTANEOUS | Status: DC

## 2012-05-09 MED ORDER — ONDANSETRON HCL 4 MG/2ML IJ SOLN
INTRAMUSCULAR | Status: DC | PRN
  Administered 2012-05-09: 4 mg via INTRAVENOUS

## 2012-05-09 MED ORDER — LACTATED RINGERS IV SOLN: INTRAVENOUS | Status: DC

## 2012-05-09 MED ORDER — MEDROXYPROGESTERONE ACETATE 150 MG/ML IM SUSP: 150.0000 mg | INTRAMUSCULAR | Status: DC | PRN

## 2012-05-09 MED ORDER — ONDANSETRON HCL 4 MG/2ML IJ SOLN: 4.0000 mg | INTRAMUSCULAR | Status: DC | PRN

## 2012-05-09 MED ORDER — MORPHINE SULFATE (PF) 0.5 MG/ML IJ SOLN
INTRAMUSCULAR | Status: DC | PRN
  Administered 2012-05-09: 2000 ug via INTRAVENOUS
  Administered 2012-05-09: 3000 ug via EPIDURAL

## 2012-05-09 MED ORDER — SODIUM CHLORIDE 0.9 % IJ SOLN: 3.0000 mL | INTRAMUSCULAR | Status: DC | PRN

## 2012-05-09 MED ORDER — DIBUCAINE 1 % RE OINT
1.0000 | TOPICAL_OINTMENT | RECTAL | Status: DC | PRN
Start: 2012-05-09 — End: 2012-05-12

## 2012-05-09 SURGICAL SUPPLY — 45 items
APL SKNCLS STERI-STRIP NONHPOA (GAUZE/BANDAGES/DRESSINGS) ×1
BENZOIN TINCTURE PRP APPL 2/3 (GAUZE/BANDAGES/DRESSINGS) ×2 IMPLANT
CANISTER WOUND CARE 500ML ATS (WOUND CARE) IMPLANT
CHLORAPREP W/TINT 26ML (MISCELLANEOUS) ×2 IMPLANT
CLOTH BEACON ORANGE TIMEOUT ST (SAFETY) ×2 IMPLANT
CONTAINER PREFILL 10% NBF 15ML (MISCELLANEOUS) IMPLANT
DRESSING TELFA 8X3 (GAUZE/BANDAGES/DRESSINGS) ×2 IMPLANT
DRSG COVADERM 4X10 (GAUZE/BANDAGES/DRESSINGS) ×2 IMPLANT
DRSG VAC ATS LRG SENSATRAC (GAUZE/BANDAGES/DRESSINGS) IMPLANT
DRSG VAC ATS MED SENSATRAC (GAUZE/BANDAGES/DRESSINGS) IMPLANT
DRSG VAC ATS SM SENSATRAC (GAUZE/BANDAGES/DRESSINGS) IMPLANT
ELECT REM PT RETURN 9FT ADLT (ELECTROSURGICAL) ×2
ELECTRODE REM PT RTRN 9FT ADLT (ELECTROSURGICAL) ×1 IMPLANT
EXTRACTOR VACUUM M CUP 4 TUBE (SUCTIONS) IMPLANT
GAUZE SPONGE 4X4 12PLY STRL LF (GAUZE/BANDAGES/DRESSINGS) ×4 IMPLANT
GLOVE BIO SURGEON STRL SZ 6.5 (GLOVE) ×4 IMPLANT
GOWN PREVENTION PLUS LG XLONG (DISPOSABLE) ×6 IMPLANT
KIT ABG SYR 3ML LUER SLIP (SYRINGE) IMPLANT
NDL HYPO 25X5/8 SAFETYGLIDE (NEEDLE) ×1 IMPLANT
NEEDLE HYPO 25X5/8 SAFETYGLIDE (NEEDLE) ×2 IMPLANT
NS IRRIG 1000ML POUR BTL (IV SOLUTION) ×2 IMPLANT
PACK C SECTION WH (CUSTOM PROCEDURE TRAY) ×2 IMPLANT
PAD ABD 7.5X8 STRL (GAUZE/BANDAGES/DRESSINGS) ×2 IMPLANT
PAD OB MATERNITY 4.3X12.25 (PERSONAL CARE ITEMS) ×2 IMPLANT
RTRCTR C-SECT PINK 25CM LRG (MISCELLANEOUS) IMPLANT
RTRCTR C-SECT PINK 34CM XLRG (MISCELLANEOUS) IMPLANT
SLEEVE SCD COMPRESS KNEE LRG (MISCELLANEOUS) ×1 IMPLANT
SLEEVE SCD COMPRESS KNEE MED (MISCELLANEOUS) IMPLANT
STAPLER VISISTAT 35W (STAPLE) IMPLANT
STRIP CLOSURE SKIN 1/2X4 (GAUZE/BANDAGES/DRESSINGS) ×2 IMPLANT
SUT MNCRL 0 VIOLET CTX 36 (SUTURE) ×2 IMPLANT
SUT MNCRL AB 3-0 PS2 27 (SUTURE) IMPLANT
SUT MONOCRYL 0 CTX 36 (SUTURE) ×3
SUT PDS AB 0 CTX 36 PDP370T (SUTURE) ×4 IMPLANT
SUT PLAIN 0 NONE (SUTURE) IMPLANT
SUT VIC AB 0 CTXB 36 (SUTURE) IMPLANT
SUT VIC AB 2-0 CT1 (SUTURE) IMPLANT
SUT VIC AB 2-0 CT1 27 (SUTURE) ×2
SUT VIC AB 2-0 CT1 TAPERPNT 27 (SUTURE) ×1 IMPLANT
SUT VIC AB 2-0 SH 27 (SUTURE)
SUT VIC AB 2-0 SH 27XBRD (SUTURE) IMPLANT
SYR 50ML LL SCALE MARK (SYRINGE) ×1 IMPLANT
TOWEL OR 17X24 6PK STRL BLUE (TOWEL DISPOSABLE) ×4 IMPLANT
TRAY FOLEY CATH 14FR (SET/KITS/TRAYS/PACK) IMPLANT
WATER STERILE IRR 1000ML POUR (IV SOLUTION) ×2 IMPLANT

## 2012-05-09 NOTE — Progress Notes (Signed)
MD notified of prolonged decel, late decels and RN interventions.  Notified of cervical exam as well.  MD stated that he wants the patient to labor down

## 2012-05-09 NOTE — Progress Notes (Signed)
[name] [name] is a 26 y.o. [phone] at [redacted]w[redacted]d by LMP admitted for induction of labor s/p ECV  Subjective: Comfortable  Objective: BP 98/60  Pulse 113  Temp 98.7 F (37.1 C) (Oral)  Resp 20  Ht 4\' 11"  (1.499 m)  Wt 79.833 kg (176 lb)  BMI 35.55 kg/m2  SpO2 100%  LMP 07/29/2011 I/O last 3 completed shifts: In: -  Out: 250 [Urine:250]    FHT:  FHR: 140 bpm, variability: moderate,  accelerations:  Present,  decelerations:  Absent UC:   irregular, every 2 minutes SVE:   Dilation: 10 Effacement (%): 100 Station: +2 Exam by:: [name] Goodnight,RN OP position confirmed by U/S.  Failed attempt to manually rotate.  Labs: Lab Results  Component Value Date   WBC 6.8 05/07/2012   HGB 12.5 05/07/2012   HCT 38.3 05/07/2012   MCV 93.9 05/07/2012   PLT 258 05/07/2012    Assessment / Plan: Persistent OP  Labor: see above Preeclampsia:  n/a Fetal Wellbeing:  Category I Pain Control:  Epidural I/D:  n/a Anticipated MOD:  C/D Informed consent obtained.  [name],[name] [place] A 05/09/2012, 11:00 AM

## 2012-05-09 NOTE — Op Note (Signed)
Cesarean Section Procedure Note   [name] [name]   05/07/2012 - 05/09/2012  Indications: Arrest of descent, persistent OP  Pre-operative Diagnosis: Arrest of descent, persistent OP  Post-operative Diagnosis: Same   Surgeon: Antionette Char A  Assistants: none  Anesthesia: Epidural  Procedure Details:  The patient was seen in the Holding Room. The risks, benefits, complications, treatment options, and expected outcomes were discussed with the patient. The patient concurred with the proposed plan, giving informed consent. The patient was identified as [name] [name] and the procedure verified as C-Section Delivery. A Time Out was held and the above information confirmed.  After induction of anesthesia, the patient was draped and prepped in the usual sterile manner. A transverse incision was made and carried down through the subcutaneous tissue to the fascia. The fascial incision was made and extended transversely. The fascia was separated from the underlying rectus tissue superiorly and inferiorly. The peritoneum was identified and entered. The peritoneal incision was extended longitudinally. The bladder was distended.  Attempts were made flush the catheter.  The utero-vesical peritoneal reflection was incised transversely and the bladder flap was bluntly freed from the lower uterine segment. A low transverse uterine incision was made. Delivered from cephalic presentation was a  living newborn.  A cord ph was not sent. The umbilical cord was clamped and cut cord. A sample was obtained for evaluation. The placenta was removed Intact and appeared normal.  There was significant bleeding noted from the edematous lower uterine segment.  The uterine incision was closed with running locked sutures of 1-0 Monocryl. A second imbricating layer of the same suture was placed.  Hemostasis was observed. The paracolic gutters were irrigated. The parieto peritoneum was closed in a running fashion with 2-0 Vicryl.   The fascia was then reapproximated with running sutures of 1-0 PDS. The subcuticular closure was performed using 3-0 Monocryl.  Instrument, sponge, and needle counts were correct prior the abdominal closure and were correct at the conclusion of the case.    Findings:  Ascites.  See above.   Estimated Blood Loss: 1,000 ml  Total IV Fluids: per Anesthesiology   Urine Output: 150CC OF bloody urine; there was hematuria at the start of the case  Specimens: none  Complications: intraoperative hemorrhage  Disposition: PACU - hemodynamically stable.  Maternal Condition: stable   Baby condition / location:  nursery-stable    Signed: Surgeon(s): Antionette Char, MD     C

## 2012-05-09 NOTE — Addendum Note (Signed)
Addendum  created 05/09/12 1555 by Algis Greenhouse, CRNA   Modules edited:Notes Section

## 2012-05-09 NOTE — Anesthesia Postprocedure Evaluation (Signed)
  Anesthesia Post Note  Patient: [name] [name]  Procedure(s) Performed: Procedure(s) (LRB): CESAREAN SECTION (N/A)  Anesthesia type: Epidural  Patient location: Mother/Baby  Post pain: Pain level controlled  Post assessment: Post-op Vital signs reviewed  Last Vitals:  Filed Vitals:   05/09/12 1435  BP: 115/73  Pulse: 112  Temp: 38.9 C  Resp:     Post vital signs: Reviewed  Level of consciousness:alert  Complications: No apparent anesthesia complications

## 2012-05-09 NOTE — Transfer of Care (Signed)
Immediate Anesthesia Transfer of Care Note  Patient: [name] [name]  Procedure(s) Performed: Procedure(s) (LRB): CESAREAN SECTION (N/A)  Patient Location: PACU  Anesthesia Type: Epidural  Level of Consciousness: oriented and sedated  Airway & Oxygen Therapy: Patient Spontanous BreathingO2 per nasal cannula  Post-op Assessment: Report given to PACU RN and Post -op Vital signs reviewed and stable  Post vital signs: Reviewed and stable  Complications: No apparent anesthesia complications

## 2012-05-09 NOTE — Anesthesia Postprocedure Evaluation (Signed)
Anesthesia Post Note  Patient: [name] [name]  Procedure(s) Performed: Procedure(s) (LRB): CESAREAN SECTION (N/A)  Anesthesia type: Epidural  Patient location: PACU  Post pain: Pain level controlled  Post assessment: Post-op Vital signs reviewed  Last Vitals:  Filed Vitals:   05/09/12 1345  BP: 112/71  Pulse: 105  Temp:   Resp: 20    Post vital signs: stable  Level of consciousness: awake  Complications: No apparent anesthesia complications

## 2012-05-09 NOTE — Progress Notes (Signed)
[name] [name] is [name] 26 y.o. [id number] at [redacted]w[redacted]d by LMP admitted for induction of labor due to Post dates. Due date 05-04-12.  Subjective:   Objective: BP 106/55  Pulse 99  Temp 98.4 F (36.9 C) (Oral)  Resp 18  Ht 4\' 11"  (1.499 m)  Wt 79.833 kg (176 lb)  BMI 35.55 kg/m2  SpO2 100%  LMP 07/29/2011 I/O last 3 completed shifts: In: -  Out: 250 [Urine:250]    FHT:  FHR: 150 bpm, variability: moderate,  accelerations:  Present,  decelerations:  Absent UC:   regular, every 2-3 minutes SVE:   Dilation: 10 Effacement (%): 100 Station: +2 Exam by:: H.Norton, RN   Labs: Lab Results  Component Value Date   WBC 6.8 05/07/2012   HGB 12.5 05/07/2012   HCT 38.3 05/07/2012   MCV 93.9 05/07/2012   PLT 258 05/07/2012    Assessment / Plan: Induction of labor due to postterm,  progressing well on pitocin  Labor: Progressing normally Preeclampsia:  n/[name] Fetal Wellbeing:  Category I Pain Control:  Epidural I/D:  n/[name] Anticipated MOD:  NSVD  [name] [name] 05/09/2012, 7:04 AM

## 2012-05-09 NOTE — Progress Notes (Signed)
Decision made to go for c/s due to baby position and station. Pt in agreement with physician.

## 2012-05-10 LAB — HEMOGLOBIN: Hemoglobin: 10.2 g/dL — ABNORMAL LOW (ref 12.0–15.0)

## 2012-05-10 NOTE — Progress Notes (Signed)
Subjective: Postpartum Day 1: Cesarean Delivery Patient reports tolerating PO, + flatus and no problems voiding.    Objective: Vital signs in last 24 hours: Temp:  [98.1 F (36.7 C)-102 F (38.9 C)] 98.1 F (36.7 C) (08/22 0429) Pulse Rate:  [97-118] 97  (08/22 0429) Resp:  [15-20] 16  (08/22 0429) BP: (99-136)/(63-83) 99/67 mmHg (08/22 0429) SpO2:  [94 %-100 %] 94 % (08/22 0429)  Physical Exam:  General: alert and no distress Lochia: appropriate Uterine Fundus: firm Incision: healing well DVT Evaluation: No evidence of DVT seen on physical exam.   Basename 05/10/12 0545 05/09/12 1336  HGB 10.2* 12.1  HCT -- 36.8    Assessment/Plan: Status post Cesarean section. Doing well postoperatively.  Continue current care.  [name] [name] A 05/10/2012, 1:52 PM

## 2012-05-10 NOTE — Progress Notes (Signed)
UR chart review completed.  

## 2012-05-11 ENCOUNTER — Encounter (HOSPITAL_COMMUNITY): Payer: Self-pay | Admitting: Obstetrics & Gynecology

## 2012-05-11 NOTE — Progress Notes (Signed)
Subjective: Postpartum Day 2: Cesarean Delivery Patient reports tolerating PO, + flatus and no problems voiding.    Objective: Vital signs in last 24 hours: Temp:  [97.9 F (36.6 C)] 97.9 F (36.6 C) (08/22 2130) Pulse Rate:  [90-91] 91  (08/22 2130) Resp:  [18-20] 20  (08/22 2130) BP: (105-107)/(69-71) 107/71 mmHg (08/22 2130) SpO2:  [97 %] 97 % (08/22 2130)  Physical Exam:  General: alert and no distress Lochia: appropriate Uterine Fundus: firm Incision: healing well DVT Evaluation: No evidence of DVT seen on physical exam.   Basename 05/10/12 0545 05/09/12 1336  HGB 10.2* 12.1  HCT -- 36.8    Assessment/Plan: Status post Cesarean section. Doing well postoperatively.  Continue current care.  HARPER,CHARLES A 05/11/2012, 5:43 AM

## 2012-05-12 NOTE — Discharge Summary (Signed)
Obstetric Discharge Summary Reason for Admission: onset of labor Prenatal Procedures: none Intrapartum Procedures: cesarean: low cervical, transverse Postpartum Procedures: none Complications-Operative and Postpartum: none Hemoglobin  Date Value Range Status  05/10/2012 10.2* 12.0 - 15.0 g/dL Final     HCT  Date Value Range Status  05/09/2012 36.8  36.0 - 46.0 % Final    Physical Exam:  General: alert Lochia: appropriate Uterine Fundus: firm Incision: healing well DVT Evaluation: No evidence of DVT seen on physical exam.  Discharge Diagnoses: Term Pregnancy-delivered  Discharge Information: Date: 05/12/2012 Activity: pelvic rest Diet: routine Medications: Percocet Condition: stable Instructions: refer to practice specific booklet Discharge to: home Follow-up Information    Follow up with HARPER,CHARLES A, MD in 6 weeks.   Contact information:   [address] [place] [address] [phone]          Newborn Data: Live born female  Birth Weight: 9 lb 13.3 oz (4460 g) APGAR: 9, 9  Home with mother.  Rosaland Shiffman A 05/12/2012, 7:36 AM

## 2013-02-21 ENCOUNTER — Encounter (HOSPITAL_COMMUNITY): Payer: Self-pay | Admitting: Family Medicine

## 2013-02-21 ENCOUNTER — Emergency Department (HOSPITAL_COMMUNITY)
Admission: EM | Admit: 2013-02-21 | Discharge: 2013-02-21 | Disposition: A | Discharge status: Home / Self Care | Payer: Medicaid Other | Attending: Emergency Medicine | Admitting: Emergency Medicine

## 2013-02-21 DIAGNOSIS — R51 Headache: Secondary | ICD-10-CM | POA: Insufficient documentation

## 2013-02-21 DIAGNOSIS — Z888 Allergy status to other drugs, medicaments and biological substances status: Secondary | ICD-10-CM | POA: Insufficient documentation

## 2013-02-21 DIAGNOSIS — R112 Nausea with vomiting, unspecified: Secondary | ICD-10-CM | POA: Insufficient documentation

## 2013-02-21 MED ORDER — SODIUM CHLORIDE 0.9 % IV BOLUS (SEPSIS)
1000.0000 mL | Freq: Once | INTRAVENOUS | Status: AC
  Administered 2013-02-21: 1000 mL via INTRAVENOUS

## 2013-02-21 MED ORDER — KETOROLAC TROMETHAMINE 30 MG/ML IJ SOLN
30.0000 mg | Freq: Once | INTRAMUSCULAR | Status: AC
  Administered 2013-02-21: 30 mg via INTRAVENOUS
  Filled 2013-02-21: qty 1

## 2013-02-21 MED ORDER — METOCLOPRAMIDE HCL 5 MG/ML IJ SOLN
10.0000 mg | Freq: Once | INTRAMUSCULAR | Status: AC
  Administered 2013-02-21: 10 mg via INTRAVENOUS
  Filled 2013-02-21: qty 2

## 2013-02-21 MED ORDER — DIPHENHYDRAMINE HCL 50 MG/ML IJ SOLN
25.0000 mg | Freq: Once | INTRAMUSCULAR | Status: AC
  Administered 2013-02-21: 25 mg via INTRAVENOUS
  Filled 2013-02-21: qty 1

## 2013-02-21 NOTE — ED Notes (Signed)
H/a x 2 weeks  Mostly left side feels nauseated  Has hx of migrain comes here for f/u

## 2013-02-21 NOTE — ED Provider Notes (Signed)
History     CSN: [id number]  Arrival date & time 02/21/13  1210   First MD Initiated Contact with Patient 02/21/13 1224      Chief Complaint  Patient presents with  . Migraine    (Consider location/radiation/quality/duration/timing/severity/associated sxs/prior treatment) HPI  27 year old female with history of migraine present complaining of headache ongoing for the past 2 weeks. Describe headaches as a sharp sensation affecting her left forehead, nonradiating, with associate nausea and occasional vomiting. She also reports occasionally seeing spots in her vision that is transient. Headache has been waxing and waning but worsened last night. She was unable to walk today due to her headache. She denies fever, chills, sneezing, coughing, sore throat, neck pain, neck stiffness, abdominal pain, or rash. She denies any numbness or weakness. She has been taking Tylenol and ibuprofen which provide some relief. Her last menstrual period was at the 13th of last month. She has not been sexually active recently and does not think she is pregnant.  Past Medical History  Diagnosis Date  . No pertinent past medical history   . Pregnant     Past Surgical History  Procedure Laterality Date  . Dilation and curettage of uterus    . Wisdom tooth extraction    . Cesarean section  05/09/2012    Procedure: CESAREAN SECTION;  Surgeon: Antionette Char, MD;  Location: [facility];  Service: Gynecology;  Laterality: N/A;    Family History  Problem Relation Age of Onset  . Anesthesia problems Neg Hx   . Other Neg Hx     History  Substance Use Topics  . Smoking status: Never Smoker   . Smokeless tobacco: Never Used  . Alcohol Use: No    OB History   Grav Para Term Preterm Abortions TAB SAB Ect Mult Living   10 4 4  0 5 4 1  0 0 4      Review of Systems  All other systems reviewed and are negative.    Allergies  Tamiflu  Home Medications  No current outpatient prescriptions on file.  BP  127/79  Pulse 85  Temp(Src) 98.2 F (36.8 C)  Resp 18  SpO2 99%  LMP 01/29/2013  Physical Exam  Nursing note and vitals reviewed. Constitutional: She is oriented to person, place, and time. She appears well-developed and well-nourished. No distress.  HENT:  Head: Normocephalic and atraumatic.  Mouth/Throat: Oropharynx is clear and moist.  Eyes: Conjunctivae and EOM are normal. Pupils are equal, round, and reactive to light.  Wearing contact lens  Neck: Neck supple.  No nuchal rigidity  Cardiovascular: Normal rate and regular rhythm.   Pulmonary/Chest: Effort normal.  Abdominal: Soft. There is no tenderness.  Lymphadenopathy:    She has no cervical adenopathy.  Neurological: She is alert and oriented to person, place, and time. She has normal strength. No cranial nerve deficit or sensory deficit. Gait normal. GCS eye subscore is 4. GCS verbal subscore is 5. GCS motor subscore is 6.  Skin: Skin is warm. No rash noted.  Psychiatric: She has a normal mood and affect.    ED Course  Procedures (including critical care time)  12:34 PM Patient presents with headache similar to her migraine headache. No red flags. No acute onset thunderclap headache concerning for subarachnoid hemorrhage, no focal neuro deficits concerning for stroke, no fever or meningismal sign concerning for meningitis. Patient is afebrile with stable normal vital sign. Will give migraine cocktail and will continue to monitor.  3:17 PM Patient felt  much better after receiving a migraine cocktail. She is stable for discharge. Return precautions discussed. All questions answered to patient's satisfaction.  Labs Reviewed - No data to display No results found.   1. Headache       MDM  BP 127/79  Pulse 85  Temp(Src) 98.2 F (36.8 C)  Resp 18  SpO2 99%  LMP 01/29/2013  I have reviewed nursing notes and vital signs.  I reviewed available ER/hospitalization records thought the EMR         Fayrene Helper, [place] 02/21/13 1520

## 2013-02-21 NOTE — ED Notes (Signed)
Per pt sts HA x 2 weeks. sts hx of migraines. sts also some nausea.

## 2013-02-22 NOTE — ED Provider Notes (Signed)
Medical screening examination/treatment/procedure(s) were performed by non-physician practitioner and as supervising physician I was immediately available for consultation/collaboration.   Gwyneth Sprout, MD 02/22/13 0830

## 2013-03-19 ENCOUNTER — Encounter: Payer: Self-pay | Admitting: Obstetrics

## 2013-03-19 ENCOUNTER — Ambulatory Visit (INDEPENDENT_AMBULATORY_CARE_PROVIDER_SITE_OTHER): Payer: Medicaid Other | Admitting: Obstetrics

## 2013-03-19 VITALS — BP 121/79 | HR 76 | Temp 98.2°F | Ht 60.0 in | Wt 150.0 lb

## 2013-03-19 DIAGNOSIS — Z113 Encounter for screening for infections with a predominantly sexual mode of transmission: Secondary | ICD-10-CM

## 2013-03-19 DIAGNOSIS — Z0000 Encounter for general adult medical examination without abnormal findings: Secondary | ICD-10-CM

## 2013-03-19 DIAGNOSIS — N760 Acute vaginitis: Secondary | ICD-10-CM | POA: Insufficient documentation

## 2013-03-19 MED ORDER — METRONIDAZOLE 500 MG PO TABS: 500.0000 mg | ORAL_TABLET | Freq: Two times a day (BID) | ORAL | Status: DC

## 2013-03-19 NOTE — Progress Notes (Signed)
.   Subjective:     [name] [name] is a 27 y.o. female here for an annual exam.  Current complaints: abnormal discharge with pelvic pain for a couple of weeks.  Personal health questionnaire reviewed: yes.   Gynecologic History Patient's last menstrual period was 03/01/2013. Contraception: none Last Pap: 08/2011. Results were: normal Last mammogram: N/A  Obstetric History OB History   Grav Para Term Preterm Abortions TAB SAB Ect Mult Living   10 4 4  0 5 4 1  0 0 4     # Outc Date GA Lbr Len/2nd Wgt Sex Del Anes PTL Lv   1 SAB 2002           2 TRM 2003 [redacted]w[redacted]d 23:00 8lb(3.629kg) F SVD EPI  Yes   3 TRM 2005 [redacted]w[redacted]d 06:00 5lb5oz(2.41kg) M SVD EPI  Yes   4 TRM 2007 [redacted]w[redacted]d 03:00 6lb7oz(2.92kg) F SVD EPI  Yes   5 TAB 2008           6 TAB 2009           7 TAB 2009           8 TAB 2010           9 TRM 8/13 [redacted]w[redacted]d 00:00 / 10:58 9lb13.3oz(4.46kg) M LVCS EPI  Yes   Comments: WNL    10 GRA                The following portions of the patient's history were reviewed and updated as appropriate: allergies, current medications, past family history, past medical history, past social history, past surgical history and problem list.  Review of Systems Pertinent items are noted in HPI.    Objective:    General appearance: alert and no distress Breasts: normal appearance, no masses or tenderness Abdomen: normal findings: soft, non-tender Pelvic: cervix normal in appearance, external genitalia normal, no adnexal masses or tenderness, no cervical motion tenderness, rectovaginal septum normal, uterus normal size, shape, and consistency and vagina with grayish discharge    Assessment:    Healthy female exam.   BV   Plan:    Education reviewed: safe sex/STD prevention. Contraception: none. Follow up in: 1 year. Flagyl Rx.

## 2013-03-20 LAB — GC/CHLAMYDIA PROBE AMP
CT Probe RNA: NEGATIVE
GC Probe RNA: NEGATIVE

## 2013-03-21 LAB — PAP IG W/ RFLX HPV ASCU

## 2013-07-25 ENCOUNTER — Other Ambulatory Visit: Payer: Self-pay

## 2013-11-30 ENCOUNTER — Encounter (HOSPITAL_COMMUNITY): Payer: Self-pay | Admitting: Emergency Medicine

## 2013-11-30 ENCOUNTER — Emergency Department (HOSPITAL_COMMUNITY)
Admission: EM | Admit: 2013-11-30 | Discharge: 2013-11-30 | Disposition: A | Discharge status: Home / Self Care | Payer: Medicaid Other | Source: Home / Self Care | Attending: Emergency Medicine | Admitting: Emergency Medicine

## 2013-11-30 ENCOUNTER — Other Ambulatory Visit (HOSPITAL_COMMUNITY)
Admission: RE | Admit: 2013-11-30 | Discharge: 2013-11-30 | Disposition: A | Discharge status: Home / Self Care | Payer: Medicaid Other | Source: Ambulatory Visit | Attending: Emergency Medicine | Admitting: Emergency Medicine

## 2013-11-30 DIAGNOSIS — N760 Acute vaginitis: Secondary | ICD-10-CM

## 2013-11-30 DIAGNOSIS — Z113 Encounter for screening for infections with a predominantly sexual mode of transmission: Secondary | ICD-10-CM | POA: Insufficient documentation

## 2013-11-30 LAB — POCT URINALYSIS DIP (DEVICE)
BILIRUBIN URINE: NEGATIVE
Glucose, UA: NEGATIVE mg/dL
HGB URINE DIPSTICK: NEGATIVE
Ketones, ur: NEGATIVE mg/dL
Leukocytes, UA: NEGATIVE
Nitrite: NEGATIVE
Protein, ur: NEGATIVE mg/dL
Specific Gravity, Urine: 1.02 (ref 1.005–1.030)
Urobilinogen, UA: 0.2 mg/dL (ref 0.0–1.0)
pH: 7 (ref 5.0–8.0)

## 2013-11-30 LAB — POCT PREGNANCY, URINE: Preg Test, Ur: NEGATIVE

## 2013-11-30 MED ORDER — FLUCONAZOLE 150 MG PO TABS: 150.0000 mg | ORAL_TABLET | Freq: Once | ORAL | Status: DC

## 2013-11-30 MED ORDER — METRONIDAZOLE 500 MG PO TABS: 500.0000 mg | ORAL_TABLET | Freq: Two times a day (BID) | ORAL | Status: DC

## 2013-11-30 NOTE — Discharge Instructions (Signed)
To restore the normal balance of "good bacteria" in your system.  Take a probiotic once daily.  These can be gotten over the counter at the drug store without a prescription and come under various brand names such as Culturelle, Align, Florastore, and [employer].  The best thing to do is to ask your pharmacist to recommend a good probiotic that is not too expensive.     Vaginitis Vaginitis is an inflammation of the vagina. It can happen when the normal bacteria and yeast in the vagina grow too much. There are different types. Treatment will depend on the type you have. HOME CARE  Take all medicines as told by your doctor.  Keep your vagina area clean and dry. Avoid soap. Rinse the area with water.  Avoid washing and cleaning out the vagina (douching).  Do not use tampons or have sex (intercourse) until your treatment is done.  Wipe from front to back after going to the restroom.  Wear cotton underwear.  Avoid wearing underwear while you sleep until your vaginitis is gone.  Avoid tight pants. Avoid underwear or nylons without a cotton panel.  Take off wet clothing (such as a bathing suit) as soon as you can.  Use mild, unscented products. Avoid fabric softeners and scented:  Feminine sprays.  Laundry detergents.  Tampons.  Soaps or bubble baths.  Practice safe sex and use condoms. GET HELP RIGHT AWAY IF:   You have belly (abdominal) pain.  You have a fever or lasting symptoms for more than 2 3 days.  You have a fever and your symptoms suddenly get worse. MAKE SURE YOU:   Understand these instructions.  Will watch this condition.  Will get help right away if you are not doing well or get worse. Document Released: 12/02/2008 Document Revised: 05/30/2012 Document Reviewed: 02/16/2012 [facility] Patient Information 2014 [employer], [place].

## 2013-11-30 NOTE — ED Notes (Signed)
Patient complains of white discharge while voiding that started 3 days ago; with itching and abdominal pain; denies fever/chills, n/v.

## 2013-11-30 NOTE — ED Provider Notes (Signed)
Chief Complaint   Chief Complaint  Patient presents with  . Abdominal Pain  . Vaginal Discharge    History of Present Illness   [name] [name] is a 28 year old female who has had a three-day history of vaginal discharge, itching, and older. She also has slight lower abdominal pain right at the midline which is better with Tylenol. She denies any pain with intercourse, lower back pain, or urinary symptoms. She's had no fever, chills, nausea, or vomiting. No diarrhea, constipation, blood in the stool. 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 Her last menstrual period was March 4. She is sexually active without use of birth control or condoms.  Review of Systems   Other than as noted above, the patient denies any of the following symptoms: Systemic:  No fever or chills GI:  No abdominal pain, nausea, vomiting, diarrhea, constipation, melena or hematochezia. GU:  No dysuria, frequency, urgency, hematuria, vaginal discharge, itching, or abnormal vaginal bleeding.  PMFSH   Past medical history, family history, social history, meds, and allergies were reviewed.    Physical Examination    Vital signs:  BP 107/78  Pulse 69  Temp(Src) 98.6 F (37 C) (Oral)  Resp 16  SpO2 100%  LMP 11/20/2013 General:  Alert, oriented and in no distress. Lungs:  Breath sounds clear and equal bilaterally.  No wheezes, rales or rhonchi. Heart:  Regular rhythm.  No gallops or murmers. Abdomen:  Soft, flat and non-distended.  No organomegaly or mass.  There is slight suprapubic tenderness to palpation without guarding or rebound.  Bowel sounds normally active. Pelvic exam:  Normal external genitalia, vaginal and cervical mucosa were normal, there was a moderate amount of malodorous, white, homogeneous discharge. There was no pain on cervical motion. Uterus was normal in size and shape and nontender. No adnexal tenderness or mass.  DNA probes for gonorrhea, Chlamydia, Trichomonas, Gardnerella, Candida were  obtained. Skin:  Clear, warm and dry.  Labs   Results for orders placed during the hospital encounter of 11/30/13  POCT URINALYSIS DIP (DEVICE)      Result Value Ref Range   Glucose, UA NEGATIVE  NEGATIVE mg/dL   Bilirubin Urine NEGATIVE  NEGATIVE   Ketones, ur NEGATIVE  NEGATIVE mg/dL   Specific Gravity, Urine 1.020  1.005 - 1.030   Hgb urine dipstick NEGATIVE  NEGATIVE   pH 7.0  5.0 - 8.0   Protein, ur NEGATIVE  NEGATIVE mg/dL   Urobilinogen, UA 0.2  0.0 - 1.0 mg/dL   Nitrite NEGATIVE  NEGATIVE   Leukocytes, UA NEGATIVE  NEGATIVE  POCT PREGNANCY, URINE      Result Value Ref Range   Preg Test, Ur NEGATIVE  NEGATIVE    Assessment   The encounter diagnosis was Vaginitis and vulvovaginitis, unspecified.  No evidence for pelvic inflammatory disease.       Plan    1.  Meds:  The following meds were prescribed:   Discharge Medication List as of 11/30/2013  2:51 PM    START taking these medications   Details  fluconazole (DIFLUCAN) 150 MG tablet Take 1 tablet (150 mg total) by mouth once., Starting 11/30/2013, Normal    !! metroNIDAZOLE (FLAGYL) 500 MG tablet Take 1 tablet (500 mg total) by mouth 2 (two) times daily., Starting 11/30/2013, Until Discontinued, Normal     !! - Potential duplicate medications found. Please discuss with provider.      2.  Patient Education/Counseling:  The patient was given appropriate handouts, self care  instructions, and instructed in symptomatic relief.  Suggested use of probiotics for prevention.  3.  Follow up:  The patient was told to follow up here if no better in 3 to 4 days, or sooner f becoming worse in any way, and given some red flag symptoms such as worsening pain, fever, persistent vomiting, or heavy vaginal bleeding which would prompt immediate return.       Reuben Likesavid C Lainey Nelson, MD 11/30/13 2033

## 2013-12-02 LAB — CERVICOVAGINAL ANCILLARY ONLY
Chlamydia: NEGATIVE
Neisseria Gonorrhea: NEGATIVE
Wet Prep (BD Affirm): NEGATIVE
Wet Prep (BD Affirm): NEGATIVE
Wet Prep (BD Affirm): POSITIVE — AB

## 2013-12-02 LAB — URINE CULTURE: SPECIAL REQUESTS: NORMAL

## 2013-12-02 NOTE — ED Notes (Signed)
GC/Chlamydia neg., Affirm: Candida and Trich neg., Gardnerella pos., Urine culture: 1,000 colonies insignificant growth.  Pt. adequately treated with Flagyl. Vassie MoselleYork, Trexton Escamilla M 12/02/2013

## 2014-02-14 ENCOUNTER — Emergency Department (HOSPITAL_COMMUNITY)
Admission: EM | Admit: 2014-02-14 | Discharge: 2014-02-14 | Disposition: A | Discharge status: Home / Self Care | Payer: Medicaid Other | Source: Home / Self Care

## 2014-02-14 NOTE — ED Notes (Signed)
No answer in lobby x 2.

## 2014-02-14 NOTE — ED Notes (Signed)
This pt was called in all waiting areas w/ no response.

## 2014-03-19 ENCOUNTER — Ambulatory Visit: Payer: Medicaid Other | Admitting: Obstetrics

## 2014-03-26 ENCOUNTER — Ambulatory Visit: Payer: Medicaid Other | Admitting: Obstetrics

## 2014-03-27 ENCOUNTER — Ambulatory Visit: Payer: Medicaid Other | Admitting: Obstetrics

## 2014-04-25 ENCOUNTER — Emergency Department (INDEPENDENT_AMBULATORY_CARE_PROVIDER_SITE_OTHER)
Admission: EM | Admit: 2014-04-25 | Discharge: 2014-04-25 | Disposition: A | Discharge status: Home / Self Care | Payer: Medicaid Other | Source: Home / Self Care | Attending: Family Medicine | Admitting: Family Medicine

## 2014-04-25 ENCOUNTER — Encounter (HOSPITAL_COMMUNITY): Payer: Self-pay | Admitting: Emergency Medicine

## 2014-04-25 DIAGNOSIS — H109 Unspecified conjunctivitis: Secondary | ICD-10-CM

## 2014-04-25 MED ORDER — CIPROFLOXACIN HCL 0.3 % OP SOLN: 1.0000 [drp] | OPHTHALMIC | Status: DC

## 2014-04-25 NOTE — Discharge Instructions (Signed)
No contacts until infection resolved, use warm cloth and drops for 5-7 days, see eye doctor if further problems.

## 2014-04-25 NOTE — ED Provider Notes (Signed)
CSN: [id number]     Arrival date & time 04/25/14  0910 History   First MD Initiated Contact with Patient 04/25/14 [phone]     Chief Complaint  Patient presents with  . Conjunctivitis   (Consider location/radiation/quality/duration/timing/severity/associated sxs/prior Treatment) Patient is a 28 y.o. female presenting with conjunctivitis. The history is provided by the patient.  Conjunctivitis This is a new problem. The current episode started 3 to 5 hours ago. The problem has not changed since onset.Associated symptoms comments: Wears contacts, no uri or sinus sx, no exposure..    Past Medical History  Diagnosis Date  . No pertinent past medical history   . Pregnant    Past Surgical History  Procedure Laterality Date  . Dilation and curettage of uterus    . Wisdom tooth extraction    . Cesarean section  05/09/2012    Procedure: CESAREAN SECTION;  Surgeon: Antionette CharLisa Jackson-Moore, MD;  Location: [facility];  Service: Gynecology;  Laterality: N/A;   Family History  Problem Relation Age of Onset  . Anesthesia problems Neg Hx   . Other Neg Hx    History  Substance Use Topics  . Smoking status: Never Smoker   . Smokeless tobacco: Never Used  . Alcohol Use: No   OB History   Grav Para Term Preterm Abortions TAB SAB Ect Mult Living   10 4 4  0 5 4 1  0 0 4     Review of Systems  Constitutional: Negative.   Eyes: Positive for redness. Negative for photophobia, pain, itching and visual disturbance.    Allergies  Tamiflu  Home Medications   Prior to Admission medications   Medication Sig Start Date End Date Taking? Authorizing Provider  acetaminophen (TYLENOL) 325 MG tablet Take 650 mg by mouth every 6 (six) hours as needed for pain.    Historical Provider, MD  ciprofloxacin (CILOXAN) 0.3 % ophthalmic solution Place 1 drop into the right eye every 2 (two) hours. Administer 1 drop, every 2 hours, while awake, for 2 days. Then 1 drop, every 4 hours, while awake, for the next 5 days. 04/25/14    Linna HoffJames D Dainelle Hun, MD  fluconazole (DIFLUCAN) 150 MG tablet Take 1 tablet (150 mg total) by mouth once. 11/30/13   Reuben Likesavid C Keller, MD  ibuprofen (ADVIL,MOTRIN) 200 MG tablet Take 400 mg by mouth every 6 (six) hours as needed for pain.    Historical Provider, MD  metroNIDAZOLE (FLAGYL) 500 MG tablet Take 1 tablet (500 mg total) by mouth 2 (two) times daily. 03/19/13   Brock Badharles A Harper, MD  metroNIDAZOLE (FLAGYL) 500 MG tablet Take 1 tablet (500 mg total) by mouth 2 (two) times daily. 11/30/13   Reuben Likesavid C Keller, MD   BP 123/75  Pulse 75  Temp(Src) 101.1 F (38.4 C) (Oral)  Resp 16  Ht 4\' 11"  (1.499 m)  Wt 140 lb (63.504 kg)  BMI 28.26 kg/m2  SpO2 99%  LMP 03/31/2014  Breastfeeding? Unknown Physical Exam  Nursing note and vitals reviewed. Constitutional: She is oriented to person, place, and time. She appears well-developed and well-nourished.  Eyes: EOM and lids are normal. Pupils are equal, round, and reactive to light. Right eye exhibits no exudate. Right conjunctiva is injected. Left conjunctiva is not injected.  Neck: Normal range of motion. Neck supple.  Lymphadenopathy:    She has no cervical adenopathy.  Neurological: She is alert and oriented to person, place, and time.  Skin: Skin is warm and dry.    ED Course  Procedures (including critical care time) Labs Review Labs Reviewed - No data to display  Imaging Review No results found.   MDM   1. Bilateral conjunctivitis        Linna Hoff, MD 04/25/14 [phone]

## 2014-04-25 NOTE — ED Notes (Signed)
Patient c/o right eye irritation onset this morning. Patient c/o drainage and redness. Patient denies any pain. Patient is alert and oriented and in no acute distress.

## 2014-07-21 ENCOUNTER — Encounter (HOSPITAL_COMMUNITY): Payer: Self-pay | Admitting: Emergency Medicine

## 2016-01-07 ENCOUNTER — Inpatient Hospital Stay (HOSPITAL_COMMUNITY)
Admission: AD | Admit: 2016-01-07 | Discharge: 2016-01-07 | Disposition: A | Discharge status: Home / Self Care | Payer: Self-pay | Source: Ambulatory Visit | Attending: Obstetrics and Gynecology | Admitting: Obstetrics and Gynecology

## 2016-01-07 ENCOUNTER — Encounter (HOSPITAL_COMMUNITY): Payer: Self-pay

## 2016-01-07 DIAGNOSIS — B3731 Acute candidiasis of vulva and vagina: Secondary | ICD-10-CM

## 2016-01-07 DIAGNOSIS — R42 Dizziness and giddiness: Secondary | ICD-10-CM | POA: Insufficient documentation

## 2016-01-07 DIAGNOSIS — B373 Candidiasis of vulva and vagina: Secondary | ICD-10-CM | POA: Insufficient documentation

## 2016-01-07 LAB — CBC
HEMATOCRIT: 36.2 % (ref 36.0–46.0)
Hemoglobin: 12.1 g/dL (ref 12.0–15.0)
MCH: 30.4 pg (ref 26.0–34.0)
MCHC: 33.4 g/dL (ref 30.0–36.0)
MCV: 91 fL (ref 78.0–100.0)
Platelets: 277 10*3/uL (ref 150–400)
RBC: 3.98 MIL/uL (ref 3.87–5.11)
RDW: 12.4 % (ref 11.5–15.5)
WBC: 4.8 10*3/uL (ref 4.0–10.5)

## 2016-01-07 LAB — COMPREHENSIVE METABOLIC PANEL
ALT: 27 U/L (ref 14–54)
AST: 27 U/L (ref 15–41)
Albumin: 4.3 g/dL (ref 3.5–5.0)
Alkaline Phosphatase: 56 U/L (ref 38–126)
Anion gap: 5 (ref 5–15)
BUN: 14 mg/dL (ref 6–20)
CHLORIDE: 104 mmol/L (ref 101–111)
CO2: 29 mmol/L (ref 22–32)
Calcium: 9.2 mg/dL (ref 8.9–10.3)
Creatinine, Ser: 0.73 mg/dL (ref 0.44–1.00)
GFR calc Af Amer: >60 mL/min (ref >60)
Glucose, Bld: 111 mg/dL — ABNORMAL HIGH (ref 65–99)
POTASSIUM: 3.3 mmol/L — AB (ref 3.5–5.1)
SODIUM: 138 mmol/L (ref 135–145)
Total Bilirubin: 0.7 mg/dL (ref 0.3–1.2)
Total Protein: 7.3 g/dL (ref 6.5–8.1)

## 2016-01-07 LAB — POCT PREGNANCY, URINE: Preg Test, Ur: NEGATIVE

## 2016-01-07 LAB — URINALYSIS, ROUTINE W REFLEX MICROSCOPIC
BILIRUBIN URINE: NEGATIVE
Glucose, UA: NEGATIVE mg/dL
Hgb urine dipstick: NEGATIVE
KETONES UR: NEGATIVE mg/dL
Leukocytes, UA: NEGATIVE
NITRITE: NEGATIVE
PH: 6 (ref 5.0–8.0)
Protein, ur: NEGATIVE mg/dL
Specific Gravity, Urine: 1.01 (ref 1.005–1.030)

## 2016-01-07 LAB — WET PREP, GENITAL
Clue Cells Wet Prep HPF POC: NONE SEEN
SPERM: NONE SEEN
TRICH WET PREP: NONE SEEN
YEAST WET PREP: NONE SEEN

## 2016-01-07 MED ORDER — NYSTATIN-TRIAMCINOLONE 100000-0.1 UNIT/GM-% EX CREA: TOPICAL_CREAM | CUTANEOUS | Status: DC

## 2016-01-07 MED ORDER — ACETAMINOPHEN 500 MG PO TABS
1000.0000 mg | ORAL_TABLET | Freq: Once | ORAL | Status: AC
  Administered 2016-01-07: 1000 mg via ORAL
  Filled 2016-01-07: qty 2

## 2016-01-07 MED ORDER — FLUCONAZOLE 150 MG PO TABS: 150.0000 mg | ORAL_TABLET | Freq: Once | ORAL | Status: DC

## 2016-01-07 NOTE — MAU Provider Note (Signed)
History     CSN: [id number]  Arrival date and time: 01/07/16 2042   First Provider Initiated Contact with Patient 01/07/16 2124      Chief Complaint  Patient presents with  . Dizziness  . Vaginal Discharge   Dizziness This is a new problem. The current episode started today. The problem occurs intermittently. The problem has been unchanged. Associated symptoms include headaches and nausea. Pertinent negatives include no abdominal pain, chills, fever or vomiting. Exacerbated by: movement  She has tried nothing for the symptoms. The treatment provided no relief.  Vaginal Discharge The patient's primary symptoms include genital itching and vaginal discharge. This is a new problem. The current episode started in the past 7 days. The problem occurs constantly. The problem has been unchanged. The patient is experiencing no pain. She is not pregnant. Associated symptoms include headaches and nausea. Pertinent negatives include no abdominal pain, chills, constipation, diarrhea, dysuria, fever, frequency, urgency or vomiting. The vaginal discharge was normal. There has been no bleeding. Nothing aggravates the symptoms. She has tried antifungals for the symptoms. The treatment provided no relief. She is not sexually active. She uses nothing for contraception. Her menstrual history has been regular (LMP 12/28/15 ).    Past Medical History  Diagnosis Date  . No pertinent past medical history   . Pregnant     Past Surgical History  Procedure Laterality Date  . Dilation and curettage of uterus    . Wisdom tooth extraction    . Cesarean section  05/09/2012    Procedure: CESAREAN SECTION;  Surgeon: Antionette Char, MD;  Location: [facility];  Service: Gynecology;  Laterality: N/A;    Family History  Problem Relation Age of Onset  . Anesthesia problems Neg Hx   . Other Neg Hx     Social History  Substance Use Topics  . Smoking status: Never Smoker   . Smokeless tobacco: Never Used  . Alcohol  Use: No    Allergies:  Allergies  Allergen Reactions  . Tamiflu Rash    Prescriptions prior to admission  Medication Sig Dispense Refill Last Dose  . acetaminophen (TYLENOL) 325 MG tablet Take 650 mg by mouth every 6 (six) hours as needed for pain.   Unknown at Unknown time  . ciprofloxacin (CILOXAN) 0.3 % ophthalmic solution Place 1 drop into the right eye every 2 (two) hours. Administer 1 drop, every 2 hours, while awake, for 2 days. Then 1 drop, every 4 hours, while awake, for the next 5 days. 10 mL 0   . fluconazole (DIFLUCAN) 150 MG tablet Take 1 tablet (150 mg total) by mouth once. 1 tablet 0 Unknown at Unknown time  . ibuprofen (ADVIL,MOTRIN) 200 MG tablet Take 400 mg by mouth every 6 (six) hours as needed for pain.   Unknown at Unknown time  . metroNIDAZOLE (FLAGYL) 500 MG tablet Take 1 tablet (500 mg total) by mouth 2 (two) times daily. 14 tablet 0 Unknown at Unknown time  . metroNIDAZOLE (FLAGYL) 500 MG tablet Take 1 tablet (500 mg total) by mouth 2 (two) times daily. 14 tablet 0 Unknown at Unknown time    Review of Systems  Constitutional: Negative for fever and chills.  Gastrointestinal: Positive for nausea. Negative for vomiting, abdominal pain, diarrhea and constipation.  Genitourinary: Positive for vaginal discharge. Negative for dysuria, urgency and frequency.  Neurological: Positive for dizziness and headaches.   Physical Exam   Blood pressure 126/77, pulse 73, temperature 98.3 F (36.8 C), temperature source Oral, resp.  rate 16, height 4' 11.5" (1.511 m), weight 61.236 kg (135 lb), last menstrual period 12/28/2015, SpO2 100 %, unknown if currently breastfeeding.  Physical Exam  Nursing note and vitals reviewed. Constitutional: She is oriented to person, place, and time. She appears well-developed and well-nourished. No distress.  HENT:  Head: Normocephalic.  Cardiovascular: Normal rate.   Respiratory: Effort normal.  GI: Soft. There is no tenderness. There is no  rebound.  Genitourinary:   External: no lesion, diffuse erythema  Vagina: small amount of thick, white, clumpy discharge Cervix: pink, smooth, no CMT Uterus: NSSC Adnexa: NT   Neurological: She is alert and oriented to person, place, and time.  Skin: Skin is warm and dry.  Psychiatric: She has a normal mood and affect.   Results for orders placed or performed during the hospital encounter of 01/07/16 (from the past 24 hour(s))  Urinalysis, Routine w reflex microscopic (not at [facility])     Status: None   Collection Time: 01/07/16  8:52 PM  Result Value Ref Range   Color, Urine YELLOW YELLOW   APPearance CLEAR CLEAR   Specific Gravity, Urine 1.010 1.005 - 1.030   pH 6.0 5.0 - 8.0   Glucose, UA NEGATIVE NEGATIVE mg/dL   Hgb urine dipstick NEGATIVE NEGATIVE   Bilirubin Urine NEGATIVE NEGATIVE   Ketones, ur NEGATIVE NEGATIVE mg/dL   Protein, ur NEGATIVE NEGATIVE mg/dL   Nitrite NEGATIVE NEGATIVE   Leukocytes, UA NEGATIVE NEGATIVE  Pregnancy, urine POC     Status: None   Collection Time: 01/07/16  9:00 PM  Result Value Ref Range   Preg Test, Ur NEGATIVE NEGATIVE  Comprehensive metabolic panel     Status: Abnormal   Collection Time: 01/07/16  9:22 PM  Result Value Ref Range   Sodium 138 135 - 145 mmol/L   Potassium 3.3 (L) 3.5 - 5.1 mmol/L   Chloride 104 101 - 111 mmol/L   CO2 29 22 - 32 mmol/L   Glucose, Bld 111 (H) 65 - 99 mg/dL   BUN 14 6 - 20 mg/dL   Creatinine, Ser 7.820.73 0.44 - 1.00 mg/dL   Calcium 9.2 8.9 - 95.610.3 mg/dL   Total Protein 7.3 6.5 - 8.1 g/dL   Albumin 4.3 3.5 - 5.0 g/dL   AST 27 15 - 41 U/L   ALT 27 14 - 54 U/L   Alkaline Phosphatase 56 38 - 126 U/L   Total Bilirubin 0.7 0.3 - 1.2 mg/dL   GFR calc non Af Amer >60 >60 mL/min   GFR calc Af Amer >60 >60 mL/min   Anion gap 5 5 - 15  CBC     Status: None   Collection Time: 01/07/16  9:22 PM  Result Value Ref Range   WBC 4.8 4.0 - 10.5 K/uL   RBC 3.98 3.87 - 5.11 MIL/uL   Hemoglobin 12.1 12.0 - 15.0 g/dL    HCT 21.336.2 08.636.0 - 57.846.0 %   MCV 91.0 78.0 - 100.0 fL   MCH 30.4 26.0 - 34.0 pg   MCHC 33.4 30.0 - 36.0 g/dL   RDW 46.912.4 62.911.5 - 52.815.5 %   Platelets 277 150 - 400 K/uL  Wet prep, genital     Status: Abnormal   Collection Time: 01/07/16  9:40 PM  Result Value Ref Range   Yeast Wet Prep HPF POC NONE SEEN NONE SEEN   Trich, Wet Prep NONE SEEN NONE SEEN   Clue Cells Wet Prep HPF POC NONE SEEN NONE SEEN   WBC,  Wet Prep HPF POC MODERATE (A) NONE SEEN   Sperm NONE SEEN      MAU Course  Procedures  MDM   Assessment and Plan   1. Yeast infection involving the vagina and surrounding area    DC home Comfort measures reviewed  RX: diflucan, mycolog Return to MAU as needed   Follow-up Information    Follow up with [facility].   Why:  If symptoms worsen   Contact information:   [address] [place] [address] [phone]         Tawnya Crook 01/07/2016, 9:32 PM

## 2016-01-07 NOTE — MAU Note (Signed)
Pt reports nausea that just started and her vagina is irritated and red.

## 2016-01-07 NOTE — Discharge Instructions (Signed)

## 2016-01-08 LAB — RPR: RPR: NONREACTIVE

## 2016-01-08 LAB — GC/CHLAMYDIA PROBE AMP (~~LOC~~) NOT AT ARMC
Chlamydia: NEGATIVE
Neisseria Gonorrhea: NEGATIVE

## 2016-01-08 LAB — HIV ANTIBODY (ROUTINE TESTING W REFLEX): HIV SCREEN 4TH GENERATION: NONREACTIVE

## 2016-02-25 ENCOUNTER — Ambulatory Visit: Payer: Medicaid Other | Admitting: Obstetrics

## 2016-03-15 ENCOUNTER — Encounter: Payer: Self-pay | Admitting: Obstetrics

## 2016-03-15 ENCOUNTER — Ambulatory Visit (INDEPENDENT_AMBULATORY_CARE_PROVIDER_SITE_OTHER): Payer: Medicaid Other | Admitting: Obstetrics

## 2016-03-15 VITALS — BP 99/66 | HR 68 | Temp 99.2°F | Wt 132.0 lb

## 2016-03-15 DIAGNOSIS — Z3049 Encounter for surveillance of other contraceptives: Secondary | ICD-10-CM | POA: Diagnosis not present

## 2016-03-15 DIAGNOSIS — Z01419 Encounter for gynecological examination (general) (routine) without abnormal findings: Secondary | ICD-10-CM

## 2016-03-15 DIAGNOSIS — Z3009 Encounter for other general counseling and advice on contraception: Secondary | ICD-10-CM

## 2016-03-15 LAB — POCT URINALYSIS DIPSTICK
Bilirubin, UA: NEGATIVE
Blood, UA: NEGATIVE
GLUCOSE UA: NEGATIVE
KETONES UA: NEGATIVE
Leukocytes, UA: NEGATIVE
Nitrite, UA: NEGATIVE
PH UA: 7
Spec Grav, UA: <=1.005
UROBILINOGEN UA: NEGATIVE

## 2016-03-15 NOTE — Addendum Note (Signed)
Addended by: Marya LandryFOSTER, Sherif Millspaugh D on: 03/15/2016 04:46 PM   Modules accepted: Orders

## 2016-03-15 NOTE — Addendum Note (Signed)
Addended by: Luella CookMCINTYRE, Fendi Meinhardt E on: 03/15/2016 04:19 PM   Modules accepted: Orders, SmartSet

## 2016-03-15 NOTE — Progress Notes (Signed)
Subjective:        [name] [name] is a 30 y.o. female here for a routine exam.  Current complaints: None.    Personal health questionnaire:  Is patient Ashkenazi Jewish, have a family history of breast and/or ovarian cancer: no Is there a family history of uterine cancer diagnosed at age < 350, gastrointestinal cancer, urinary tract cancer, family member who is a Personnel officerLynch syndrome-associated carrier: no Is the patient overweight and hypertensive, family history of diabetes, personal history of gestational diabetes, preeclampsia or PCOS: no Is patient over 4555, have PCOS,  family history of premature CHD under age 32, diabetes, smoke, have hypertension or peripheral artery disease:  no At any time, has a partner hit, kicked or otherwise hurt or frightened you?: no Over the past 2 weeks, have you felt down, depressed or hopeless?: no Over the past 2 weeks, have you felt little interest or pleasure in doing things?:no   Gynecologic History No LMP recorded. Contraception: none Last Pap: 2014. Results were: normal Last mammogram: n/a. Results were: n/a  Obstetric History OB History  Gravida Para Term Preterm AB SAB TAB Ectopic Multiple Living  10 4 4  0 5 1 4  0 0 4    # Outcome Date GA Lbr Len/2nd Weight Sex Delivery Anes PTL Lv  10 Term 05/09/12 3728w5d / 10:58 9 lb 13.3 oz (4.46 kg) M CS-LVertical EPI  Y     Comments: WNL   9 TAB 2010          8 TAB 2009          7 TAB 2009          6 TAB 2008          5 Term 2007 752w0d 03:00 6 lb 7 oz (2.92 kg) F Vag-Spont EPI  Y  4 Term 2005 1952w0d 06:00 5 lb 5 oz (2.41 kg) M Vag-Spont EPI  Y  3 Term 2003 6252w0d 23:00 8 lb (3.629 kg) F Vag-Spont EPI  Y  2 SAB 2002          1 Gravida               Past Medical History  Diagnosis Date  . No pertinent past medical history   . Pregnant     Past Surgical History  Procedure Laterality Date  . Dilation and curettage of uterus    . Wisdom tooth extraction    . Cesarean section  05/09/2012     Procedure: CESAREAN SECTION;  Surgeon: Antionette CharLisa Jackson-Moore, MD;  Location: [facility];  Service: Gynecology;  Laterality: N/A;     Current outpatient prescriptions:  .  acetaminophen (TYLENOL) 325 MG tablet, Take 650 mg by mouth every 6 (six) hours as needed for pain., Disp: , Rfl:  Allergies  Allergen Reactions  . Tamiflu Rash    Social History  Substance Use Topics  . Smoking status: Never Smoker   . Smokeless tobacco: Never Used  . Alcohol Use: No    Family History  Problem Relation Age of Onset  . Anesthesia problems Neg Hx   . Other Neg Hx       Review of Systems  Constitutional: negative for fatigue and weight loss Respiratory: negative for cough and wheezing Cardiovascular: negative for chest pain, fatigue and palpitations Gastrointestinal: negative for abdominal pain and change in bowel habits Musculoskeletal:negative for myalgias Neurological: negative for gait problems and tremors Behavioral/Psych: negative for abusive relationship, depression Endocrine: negative for temperature intolerance  Genitourinary:negative for abnormal menstrual periods, genital lesions, hot flashes, sexual problems and vaginal discharge Integument/breast: negative for breast lump, breast tenderness, nipple discharge and skin lesion(s)    Objective:       BP 99/66 mmHg  Pulse 68  Temp(Src) 99.2 F (37.3 C)  Wt 132 lb (59.875 kg) General:   alert  Skin:   no rash or abnormalities  Lungs:   clear to auscultation bilaterally  Heart:   regular rate and rhythm, S1, S2 normal, no murmur, click, rub or gallop  Breasts:   normal without suspicious masses, skin or nipple changes or axillary nodes  Abdomen:  normal findings: no organomegaly, soft, non-tender and no hernia  Pelvis:  External genitalia: normal general appearance Urinary system: urethral meatus normal and bladder without fullness, nontender Vaginal: normal without tenderness, induration or masses Cervix: normal  appearance Adnexa: normal bimanual exam Uterus: anteverted and non-tender, normal size   Lab Review Urine pregnancy test Labs reviewed yes Radiologic studies reviewed no    Assessment:    Healthy female exam.    Plan:    Education reviewed: calcium supplements, low fat, low cholesterol diet, safe sex/STD prevention, self breast exams and weight bearing exercise. Contraception: none. Follow up in: 1 year.   No orders of the defined types were placed in this encounter.   No orders of the defined types were placed in this encounter.

## 2016-03-17 LAB — PAP IG AND HPV HIGH-RISK
HPV, HIGH-RISK: NEGATIVE
PAP SMEAR COMMENT: 0

## 2016-03-18 ENCOUNTER — Other Ambulatory Visit: Payer: Self-pay | Admitting: Obstetrics

## 2016-03-18 DIAGNOSIS — B373 Candidiasis of vulva and vagina: Secondary | ICD-10-CM

## 2016-03-18 DIAGNOSIS — B3731 Acute candidiasis of vulva and vagina: Secondary | ICD-10-CM

## 2016-03-18 MED ORDER — FLUCONAZOLE 150 MG PO TABS: 150.0000 mg | ORAL_TABLET | Freq: Once | ORAL | Status: DC

## 2016-03-20 LAB — NUSWAB VG+, CANDIDA 6SP
CANDIDA KRUSEI, NAA: NEGATIVE
CANDIDA LUSITANIAE, NAA: NEGATIVE
Candida albicans, NAA: POSITIVE — AB
Candida glabrata, NAA: NEGATIVE
Candida parapsilosis, NAA: NEGATIVE
Candida tropicalis, NAA: NEGATIVE
Chlamydia trachomatis, NAA: NEGATIVE
Neisseria gonorrhoeae, NAA: NEGATIVE
TRICH VAG BY NAA: NEGATIVE

## 2016-04-25 ENCOUNTER — Ambulatory Visit (HOSPITAL_COMMUNITY)
Admission: EM | Admit: 2016-04-25 | Discharge: 2016-04-25 | Disposition: A | Discharge status: Home / Self Care | Payer: Self-pay | Attending: Emergency Medicine | Admitting: Emergency Medicine

## 2016-04-25 ENCOUNTER — Encounter (HOSPITAL_COMMUNITY): Payer: Self-pay | Admitting: Emergency Medicine

## 2016-04-25 DIAGNOSIS — Z202 Contact with and (suspected) exposure to infections with a predominantly sexual mode of transmission: Secondary | ICD-10-CM | POA: Insufficient documentation

## 2016-04-25 LAB — POCT URINALYSIS DIP (DEVICE)
Bilirubin Urine: NEGATIVE
Glucose, UA: NEGATIVE mg/dL
Hgb urine dipstick: NEGATIVE
Ketones, ur: NEGATIVE mg/dL
Leukocytes, UA: NEGATIVE
Nitrite: NEGATIVE
PH: 7 (ref 5.0–8.0)
Protein, ur: NEGATIVE mg/dL
Specific Gravity, Urine: 1.02 (ref 1.005–1.030)
Urobilinogen, UA: 0.2 mg/dL (ref 0.0–1.0)

## 2016-04-25 LAB — POCT PREGNANCY, URINE: PREG TEST UR: NEGATIVE

## 2016-04-25 MED ORDER — CEFTRIAXONE SODIUM 250 MG IJ SOLR
INTRAMUSCULAR | Status: AC
  Filled 2016-04-25: qty 250

## 2016-04-25 MED ORDER — LIDOCAINE HCL (PF) 1 % IJ SOLN
INTRAMUSCULAR | Status: AC
  Filled 2016-04-25: qty 2

## 2016-04-25 MED ORDER — AZITHROMYCIN 250 MG PO TABS
1000.0000 mg | ORAL_TABLET | Freq: Once | ORAL | Status: AC
  Administered 2016-04-25: 1000 mg via ORAL

## 2016-04-25 MED ORDER — CEFTRIAXONE SODIUM 250 MG IJ SOLR
250.0000 mg | Freq: Once | INTRAMUSCULAR | Status: AC
  Administered 2016-04-25: 250 mg via INTRAMUSCULAR

## 2016-04-25 MED ORDER — AZITHROMYCIN 250 MG PO TABS
ORAL_TABLET | ORAL | Status: AC
  Filled 2016-04-25: qty 4

## 2016-04-25 NOTE — ED Triage Notes (Signed)
[facility] request to be [facility] for STDs. The patient stated that she had no symptoms but her [facility].

## 2016-04-25 NOTE — ED Provider Notes (Signed)
[facility]    CSN: [id number] Arrival date & time: 04/25/16  1258  First Provider Contact:  First MD Initiated Contact with Patient 04/25/16 1329        History   Chief Complaint Chief Complaint  Patient presents with  . Exposure to STD    HPI [name] [name] is a 30 y.o. female.   She is a 30 year old woman here for possible exposure to STD. She states her boyfriend was seen here earlier for dysuria and penile discharge.  He was presumptively treated with a shot and some pills. She denies any symptoms. She states she just had her well woman exam the end of June and everything was normal. She would like to have pelvic cultures collected and be treated just to be safe.    Past Medical History:  Diagnosis Date  . No pertinent past medical history   . Pregnant     Patient Active Problem List   Diagnosis Date Noted  . Vaginitis and vulvovaginitis, unspecified 03/19/2013    Past Surgical History:  Procedure Laterality Date  . CESAREAN SECTION  05/09/2012   Procedure: CESAREAN SECTION;  Surgeon: Antionette CharLisa Jackson-Moore, MD;  Location: [facility];  Service: Gynecology;  Laterality: N/A;  . DILATION AND CURETTAGE OF UTERUS    . WISDOM TOOTH EXTRACTION      OB History    Gravida Para Term Preterm AB Living   10 4 4  0 5 4   SAB TAB Ectopic Multiple Live Births   1 4 0 0 4       Home Medications    Prior to Admission medications   Medication Sig Start Date End Date Taking? Authorizing Provider  acetaminophen (TYLENOL) 325 MG tablet Take 650 mg by mouth every 6 (six) hours as needed for pain.    Historical Provider, MD    Family History Family History  Problem Relation Age of Onset  . Anesthesia problems Neg Hx   . Other Neg Hx     Social History Social History  Substance Use Topics  . Smoking status: Never Smoker  . Smokeless tobacco: Never Used  . Alcohol use No     Allergies   Tamiflu   Review of Systems Review of Systems  Constitutional:  Negative for fever.  Gastrointestinal: Negative for abdominal pain.  Genitourinary: Negative for dysuria, vaginal discharge and vaginal pain.     Physical Exam Triage Vital Signs ED Triage Vitals  Enc Vitals Group     BP 04/25/16 1316 112/79     Pulse Rate 04/25/16 1316 74     Resp 04/25/16 1316 12     Temp 04/25/16 1316 99 F (37.2 C)     Temp Source 04/25/16 1316 Oral     SpO2 04/25/16 1316 100 %     Weight --      Height --      Head Circumference --      Peak Flow --      Pain Score 04/25/16 1326 0     Pain Loc --      Pain Edu? --      Excl. in GC? --    No data found.   Updated Vital Signs BP 112/79 (BP Location: Left Arm)   Pulse 74   Temp 99 F (37.2 C) (Oral)   Resp 12   LMP 04/02/2016 (Exact Date)   SpO2 100%   Visual Acuity Right Eye Distance:   Left Eye Distance:   Bilateral Distance:  Right Eye Near:   Left Eye Near:    Bilateral Near:     Physical Exam  Constitutional: She is oriented to person, place, and time. She appears well-developed and well-nourished. No distress.  Cardiovascular: Normal rate.   Pulmonary/Chest: Effort normal.  Genitourinary: There is no rash on the right labia. There is no rash on the left labia. Cervix exhibits no motion tenderness and no discharge. No bleeding in the vagina. No foreign body in the vagina. No vaginal discharge found.  Neurological: She is alert and oriented to person, place, and time.     UC Treatments / Results  Labs (all labs ordered are listed, but only abnormal results are displayed) Labs Reviewed  POCT URINALYSIS DIP (DEVICE)  POCT PREGNANCY, URINE  CERVICOVAGINAL ANCILLARY ONLY    EKG  EKG Interpretation None       Radiology No results found.  Procedures Procedures (including critical care time)  Medications Ordered in UC Medications  azithromycin (ZITHROMAX) tablet 1,000 mg (1,000 mg Oral Given 04/25/16 1409)  cefTRIAXone (ROCEPHIN) injection 250 mg (250 mg Intramuscular  Given 04/25/16 1409)     Initial Impression / Assessment and Plan / UC Course  I have reviewed the triage vital signs and the nursing notes.  Pertinent labs & imaging results that were available during my care of the patient were reviewed by me and considered in my medical decision making (see chart for details).  Clinical Course    UA and pregnancy negative. Exam within normal limits. Per patient request, will treat presumptively and cervical cultures sent. Follow-up as needed.  Final Clinical Impressions(s) / UC Diagnoses   Final diagnoses:  Possible exposure to STD    New Prescriptions Discharge Medication List as of 04/25/2016  1:59 PM       Charm Rings, MD 04/25/16 1414

## 2016-04-25 NOTE — Discharge Instructions (Signed)
We have sent swabs for testing. We treated you presumptively for gonorrhea and chlamydia. The results should be back in 2-3 days. Follow-up as needed.

## 2016-04-26 LAB — CERVICOVAGINAL ANCILLARY ONLY
Chlamydia: NEGATIVE
Neisseria Gonorrhea: POSITIVE — AB
Wet Prep (BD Affirm): NEGATIVE

## 2016-04-29 ENCOUNTER — Telehealth: Payer: Self-pay | Admitting: *Deleted

## 2016-04-29 NOTE — Telephone Encounter (Signed)
Pt called to office requesting treatment for yeast.  Attempt to contact pt.  LM on VM to call office.

## 2016-04-30 ENCOUNTER — Telehealth (HOSPITAL_COMMUNITY): Payer: Self-pay | Admitting: Emergency Medicine

## 2016-04-30 NOTE — Telephone Encounter (Signed)
Per Dr. Dayton ScrapeMurray,  Notes Recorded by Eustace MooreLaura W Murray, MD on 04/28/2016 at 12:07 PM EDT Please let patient and health department know that test for gonorrhea was positive.  [facility] with IM rocephin 250mg  and po zithromax 1g.  Sexual partners need to be notified and tested/treated.  Recheck as needed if symptoms persist. Result note sent to patient's MyChart. Ria ClockLaura Murray MD  Called pt and notified of recent lab results from visit 8/7 Pt ID'd properly... Reports feeling better and sx have subsided Adv pt if sx are not getting better to return and to notify partner(s) Education on safe sex given [facility] Pt verb understanding.

## 2016-05-03 ENCOUNTER — Encounter (HOSPITAL_COMMUNITY): Payer: Self-pay | Admitting: *Deleted

## 2016-05-03 ENCOUNTER — Ambulatory Visit (HOSPITAL_COMMUNITY)
Admission: EM | Admit: 2016-05-03 | Discharge: 2016-05-03 | Disposition: A | Discharge status: Home / Self Care | Payer: Self-pay | Attending: Family Medicine | Admitting: Family Medicine

## 2016-05-03 DIAGNOSIS — B373 Candidiasis of vulva and vagina: Secondary | ICD-10-CM

## 2016-05-03 DIAGNOSIS — B3731 Acute candidiasis of vulva and vagina: Secondary | ICD-10-CM

## 2016-05-03 LAB — POCT URINALYSIS DIP (DEVICE)
BILIRUBIN URINE: NEGATIVE
Glucose, UA: NEGATIVE mg/dL
Hgb urine dipstick: NEGATIVE
Ketones, ur: NEGATIVE mg/dL
LEUKOCYTES UA: NEGATIVE
NITRITE: NEGATIVE
PH: 6.5 (ref 5.0–8.0)
Protein, ur: NEGATIVE mg/dL
Specific Gravity, Urine: 1.015 (ref 1.005–1.030)
UROBILINOGEN UA: 0.2 mg/dL (ref 0.0–1.0)

## 2016-05-03 MED ORDER — TERCONAZOLE 80 MG VA SUPP: 80.0000 mg | Freq: Every day | VAGINAL | 0 refills | Status: DC

## 2016-05-03 MED ORDER — FLUCONAZOLE 150 MG PO TABS: 150.0000 mg | ORAL_TABLET | Freq: Once | ORAL | 1 refills | Status: AC

## 2016-05-03 NOTE — ED Provider Notes (Signed)
[facility]    CSN: [id number] Arrival date & time: 05/03/16  1008  First Provider Contact:  First MD Initiated Contact with Patient 05/03/16 1021        History   Chief Complaint Chief Complaint  Patient presents with  . Vaginitis    HPI [name] [name] is a 30 y.o. female.    Dysuria  Pain quality:  Burning Pain severity:  Mild Duration:  3 days Progression:  Worsening Chronicity:  New (seen 8/7 for std and found to be gc pos , given roceph/zithro, now with urinary sx and vag itching.) Recent urinary tract infections: no   Relieved by:  None tried Worsened by:  Nothing Ineffective treatments:  None tried Urinary symptoms: foul-smelling urine and frequent urination   Associated symptoms: vaginal discharge   Associated symptoms: no abdominal pain, no fever, no flank pain, no genital lesions, no nausea and no vomiting   Associated symptoms comment:  No sex since last visit.   Past Medical History:  Diagnosis Date  . No pertinent past medical history   . Pregnant     Patient Active Problem List   Diagnosis Date Noted  . Vaginitis and vulvovaginitis, unspecified 03/19/2013    Past Surgical History:  Procedure Laterality Date  . CESAREAN SECTION  05/09/2012   Procedure: CESAREAN SECTION;  Surgeon: Antionette CharLisa Jackson-Moore, MD;  Location: [facility];  Service: Gynecology;  Laterality: N/A;  . DILATION AND CURETTAGE OF UTERUS    . WISDOM TOOTH EXTRACTION      OB History    Gravida Para Term Preterm AB Living   10 4 4  0 5 4   SAB TAB Ectopic Multiple Live Births   1 4 0 0 4       Home Medications    Prior to Admission medications   Medication Sig Start Date End Date Taking? Authorizing Provider  acetaminophen (TYLENOL) 325 MG tablet Take 650 mg by mouth every 6 (six) hours as needed for pain.    Historical Provider, MD  terconazole (TERAZOL 3) 80 MG vaginal suppository Place 1 suppository (80 mg total) vaginally at bedtime. 05/03/16   Linna HoffJames D Suriyah Vergara, MD     Family History Family History  Problem Relation Age of Onset  . Anesthesia problems Neg Hx   . Other Neg Hx     Social History Social History  Substance Use Topics  . Smoking status: Never Smoker  . Smokeless tobacco: Never Used  . Alcohol use No     Allergies   Tamiflu   Review of Systems Review of Systems  Constitutional: Negative.  Negative for fever.  Gastrointestinal: Negative.  Negative for abdominal pain, nausea and vomiting.  Genitourinary: Positive for dysuria and vaginal discharge. Negative for flank pain.  All other systems reviewed and are negative.    Physical Exam Triage Vital Signs ED Triage Vitals [05/03/16 1011]  Enc Vitals Group     BP 115/69     Pulse Rate 88     Resp      Temp 98.7 F (37.1 C)     Temp Source Oral     SpO2 100 %     Weight      Height      Head Circumference      Peak Flow      Pain Score      Pain Loc      Pain Edu?      Excl. in GC?    No data found.  Updated Vital Signs BP 115/69 (BP Location: Right Arm)   Pulse 88   Temp 98.7 F (37.1 C) (Oral)   LMP 04/02/2016 (Exact Date)   SpO2 100%   Visual Acuity Right Eye Distance:   Left Eye Distance:   Bilateral Distance:    Right Eye Near:   Left Eye Near:    Bilateral Near:     Physical Exam  Constitutional: She appears well-developed and well-nourished. No distress.  Neck: Normal range of motion. Neck supple.  Abdominal: Soft. Bowel sounds are normal. She exhibits no mass. There is no tenderness. There is no rebound and no guarding.  Nursing note and vitals reviewed.    UC Treatments / Results  Labs (all labs ordered are listed, but only abnormal results are displayed) Labs Reviewed  URINE CULTURE  POCT URINALYSIS DIP (DEVICE)    EKG  EKG Interpretation None       Radiology No results found.  Procedures Procedures (including critical care time)  Medications Ordered in UC Medications - No data to display   Initial Impression  / Assessment and Plan / UC Course  I have reviewed the triage vital signs and the nursing notes.  Pertinent labs & imaging results that were available during my care of the patient were reviewed by me and considered in my medical decision making (see chart for details).  Clinical Course     Final Clinical Impressions(s) / UC Diagnoses   Final diagnoses:  Candidal vaginitis    New Prescriptions Discharge Medication List as of 05/03/2016 11:11 AM    START taking these medications   Details  fluconazole (DIFLUCAN) 150 MG tablet Take 1 tablet (150 mg total) by mouth once. Repeat in 1 week, Starting Tue 05/03/2016, Print    terconazole (TERAZOL 3) 80 MG vaginal suppository Place 1 suppository (80 mg total) vaginally at bedtime., Starting Tue 05/03/2016, Print         Linna HoffJames D Arrionna Serena, MD 05/13/16 1330

## 2016-05-03 NOTE — ED Triage Notes (Signed)
Pt  Was   Seen  Over  1  Week  Ago  And  Was  Treated  For  Std    Now  Has  Itching  And  Irritation present

## 2016-05-04 ENCOUNTER — Telehealth (HOSPITAL_COMMUNITY): Payer: Self-pay | Admitting: Emergency Medicine

## 2016-05-04 LAB — URINE CULTURE: CULTURE: NO GROWTH

## 2016-05-04 NOTE — Telephone Encounter (Addendum)
Per Dr. Dayton ScrapeMurray,  Notes Recorded by Eustace MooreLaura W Murray, MD on 04/28/2016 at 12:07 PM EDT Please let patient and health department know that test for gonorrhea was positive.  [facility] with IM rocephin 250mg  and po zithromax 1g.  Sexual partners need to be notified and tested/treated.  Recheck as needed if symptoms persist. Result note sent to patient's MyChart. Ria ClockLaura Murray MD  Called pt and notified of recent lab results from visit 8/7 Pt ID'd properly... Reports feeling better and sx have subsided Also reports she was seen on 8/15 for a recheck Adv pt if sx are not getting better to return and to notify partner(s) Education on safe sex given [facility] Pt verb understanding.

## 2016-07-11 ENCOUNTER — Ambulatory Visit (HOSPITAL_COMMUNITY)
Admission: EM | Admit: 2016-07-11 | Discharge: 2016-07-11 | Disposition: A | Discharge status: Home / Self Care | Payer: Medicaid Other | Attending: Family Medicine | Admitting: Family Medicine

## 2016-07-11 ENCOUNTER — Encounter (HOSPITAL_COMMUNITY): Payer: Self-pay | Admitting: *Deleted

## 2016-07-11 DIAGNOSIS — R350 Frequency of micturition: Secondary | ICD-10-CM

## 2016-07-11 DIAGNOSIS — Z640 Problems related to unwanted pregnancy: Secondary | ICD-10-CM

## 2016-07-11 DIAGNOSIS — Z331 Pregnant state, incidental: Secondary | ICD-10-CM | POA: Diagnosis not present

## 2016-07-11 DIAGNOSIS — M545 Low back pain: Secondary | ICD-10-CM

## 2016-07-11 LAB — POCT URINALYSIS DIP (DEVICE)
Bilirubin Urine: NEGATIVE
Glucose, UA: NEGATIVE mg/dL
Ketones, ur: NEGATIVE mg/dL
Leukocytes, UA: NEGATIVE
NITRITE: NEGATIVE
PH: 6.5 (ref 5.0–8.0)
Protein, ur: NEGATIVE mg/dL
Specific Gravity, Urine: 1.02 (ref 1.005–1.030)
UROBILINOGEN UA: 0.2 mg/dL (ref 0.0–1.0)

## 2016-07-11 LAB — POCT PREGNANCY, URINE: Preg Test, Ur: POSITIVE — AB

## 2016-07-11 MED ORDER — PRENATAL COMPLETE 14-0.4 MG PO TABS: 1.0000 | ORAL_TABLET | Freq: Every morning | ORAL | 2 refills | Status: DC

## 2016-07-11 NOTE — ED Triage Notes (Signed)
Pt  Reports       Symptoms     Of     Frequent    Urination          With  Back  Pain           Pt  Reports    Symptoms       X      5  Days  Pt  Ambulated        To room     With a  Steady  Fluid    gait

## 2016-07-11 NOTE — ED Provider Notes (Signed)
[facility]    CSN: [id number] Arrival date & time: 07/11/16  1001     History   Chief Complaint Chief Complaint  Patient presents with  . Urinary Frequency    HPI [name] [name] is a 30 y.o. female.   This is a 30 yo woman who works for [employer] in [employer].  She is a week late for her period and presents with 4-5 days of frequency and low back pain.  She has no fever or dysuria.  She is G4P4 with LMP of 06/06/2016  No vaginal bleeding.      Past Medical History:  Diagnosis Date  . No pertinent past medical history   . Pregnant     Patient Active Problem List   Diagnosis Date Noted  . Vaginitis and vulvovaginitis, unspecified 03/19/2013    Past Surgical History:  Procedure Laterality Date  . CESAREAN SECTION  05/09/2012   Procedure: CESAREAN SECTION;  Surgeon: Antionette Char, MD;  Location: [facility];  Service: Gynecology;  Laterality: N/A;  . DILATION AND CURETTAGE OF UTERUS    . WISDOM TOOTH EXTRACTION      OB History    Gravida Para Term Preterm AB Living   10 4 4  0 5 4   SAB TAB Ectopic Multiple Live Births   1 4 0 0 4       Home Medications    Prior to Admission medications   Medication Sig Start Date End Date Taking? Authorizing Provider  Prenatal Vit-Fe Fumarate-FA (PRENATAL COMPLETE) 14-0.4 MG TABS Take 1 tablet by mouth every morning. 07/11/16   Elvina Sidle, MD    Family History Family History  Problem Relation Age of Onset  . Anesthesia problems Neg Hx   . Other Neg Hx     Social History Social History  Substance Use Topics  . Smoking status: Never Smoker  . Smokeless tobacco: Never Used  . Alcohol use No     Allergies   Tamiflu   Review of Systems Review of Systems  Constitutional: Negative.   HENT: Negative.   Gastrointestinal: Negative.   Genitourinary: Positive for frequency. Negative for dysuria, hematuria and vaginal bleeding.     Physical Exam Triage Vital Signs ED Triage  Vitals  Enc Vitals Group     BP 07/11/16 1032 133/89     Pulse Rate 07/11/16 1032 96     Resp 07/11/16 1032 18     Temp 07/11/16 1032 99.2 F (37.3 C)     Temp Source 07/11/16 1032 Oral     SpO2 07/11/16 1032 100 %     Weight --      Height --      Head Circumference --      Peak Flow --      Pain Score 07/11/16 1100 7     Pain Loc --      Pain Edu? --      Excl. in GC? --    No data found.   Updated Vital Signs BP 133/89 (BP Location: Left Arm)   Pulse 96   Temp 99.2 F (37.3 C) (Oral)   Resp 18   LMP 06/06/2016   SpO2 100%    Physical Exam  Constitutional: She is oriented to person, place, and time. She appears well-developed and well-nourished.  HENT:  Head: Normocephalic.  Right Ear: External ear normal.  Left Ear: External ear normal.  Mouth/Throat: Oropharynx is clear and moist.  Eyes: Conjunctivae and EOM are  normal. Pupils are equal, round, and reactive to light.  Neck: Normal range of motion. Neck supple.  Abdominal: Soft. Bowel sounds are normal. There is no tenderness.  Musculoskeletal: Normal range of motion.  Neurological: She is alert and oriented to person, place, and time.  Skin: Skin is warm and dry.  Nursing note and vitals reviewed.    UC Treatments / Results  Labs (all labs ordered are listed, but only abnormal results are displayed) Labs Reviewed  POCT URINALYSIS DIP (DEVICE) - Abnormal; Notable for the following:       Result Value   Hgb urine dipstick TRACE (*)    All other components within normal limits  POCT PREGNANCY, URINE - Abnormal; Notable for the following:    Preg Test, Ur POSITIVE (*)    All other components within normal limits  URINE CULTURE    EKG  EKG Interpretation None       Radiology No results found.  Procedures Procedures (including critical care time)  Medications Ordered in UC Medications - No data to display   Initial Impression / Assessment and Plan / UC Course  I have reviewed the triage  vital signs and the nursing notes.  Pertinent labs & imaging results that were available during my care of the patient were reviewed by me and considered in my medical decision making (see chart for details).  Clinical Course    Final Clinical Impressions(s) / UC Diagnoses   Final diagnoses:  Pregnancy, unwanted    New Prescriptions New Prescriptions   PRENATAL VIT-FE FUMARATE-FA (PRENATAL COMPLETE) 14-0.4 MG TABS    Take 1 tablet by mouth every morning.     Elvina SidleKurt Navika Hoopes, MD 07/11/16 [phone]

## 2016-07-11 NOTE — Discharge Instructions (Signed)
You will need to see a gynecologist.

## 2016-07-13 ENCOUNTER — Other Ambulatory Visit: Payer: Self-pay | Admitting: Obstetrics

## 2016-07-13 ENCOUNTER — Telehealth: Payer: Self-pay | Admitting: *Deleted

## 2016-07-13 DIAGNOSIS — N390 Urinary tract infection, site not specified: Secondary | ICD-10-CM

## 2016-07-13 LAB — URINE CULTURE: Culture: 20000 — AB

## 2016-07-13 MED ORDER — AMOXICILLIN-POT CLAVULANATE 875-125 MG PO TABS: 1.0000 | ORAL_TABLET | Freq: Two times a day (BID) | ORAL | 0 refills | Status: DC

## 2016-07-13 NOTE — Telephone Encounter (Signed)
[facility] about Critical lab- patient is pregnant with positive GBS in urine. Per Dr Clearance CootsHarper treatment sent to patient pharmacy. Called patient to let her know- told patient to call office for appointment if she wanted to see us for her pregnancy.

## 2016-07-18 ENCOUNTER — Encounter: Payer: Self-pay | Admitting: Obstetrics and Gynecology

## 2016-07-18 ENCOUNTER — Ambulatory Visit: Payer: Self-pay

## 2016-07-18 DIAGNOSIS — Z3200 Encounter for pregnancy test, result unknown: Secondary | ICD-10-CM

## 2016-07-18 LAB — POCT PREGNANCY, URINE: Preg Test, Ur: POSITIVE — AB

## 2016-07-18 NOTE — Progress Notes (Signed)
Patient presented to office to pregnancy test. Test confirms she is pregnant around six weeks. Patient has been given a letter of verification to assist her with medicaid. Medication have been reviewed with patient.

## 2016-07-30 ENCOUNTER — Inpatient Hospital Stay (HOSPITAL_COMMUNITY): Payer: Medicaid Other

## 2016-07-30 ENCOUNTER — Encounter: Payer: Self-pay | Admitting: Emergency Medicine

## 2016-07-30 ENCOUNTER — Ambulatory Visit (HOSPITAL_COMMUNITY)
Admission: EM | Admit: 2016-07-30 | Discharge: 2016-07-30 | Disposition: A | Discharge status: Nursing Home (Includes ICF) | Payer: Medicaid Other | Attending: Internal Medicine | Admitting: Internal Medicine

## 2016-07-30 ENCOUNTER — Inpatient Hospital Stay (HOSPITAL_COMMUNITY)
Admission: AD | Admit: 2016-07-30 | Discharge: 2016-07-30 | Disposition: A | Discharge status: Home / Self Care | Payer: Medicaid Other | Source: Ambulatory Visit | Attending: Obstetrics and Gynecology | Admitting: Obstetrics and Gynecology

## 2016-07-30 ENCOUNTER — Encounter (HOSPITAL_COMMUNITY): Payer: Self-pay | Admitting: *Deleted

## 2016-07-30 DIAGNOSIS — Z3A01 Less than 8 weeks gestation of pregnancy: Secondary | ICD-10-CM

## 2016-07-30 DIAGNOSIS — N898 Other specified noninflammatory disorders of vagina: Secondary | ICD-10-CM | POA: Insufficient documentation

## 2016-07-30 DIAGNOSIS — Z8619 Personal history of other infectious and parasitic diseases: Secondary | ICD-10-CM | POA: Diagnosis not present

## 2016-07-30 DIAGNOSIS — O008 Other ectopic pregnancy without intrauterine pregnancy: Secondary | ICD-10-CM

## 2016-07-30 DIAGNOSIS — O26899 Other specified pregnancy related conditions, unspecified trimester: Secondary | ICD-10-CM

## 2016-07-30 DIAGNOSIS — R102 Pelvic and perineal pain: Secondary | ICD-10-CM | POA: Diagnosis not present

## 2016-07-30 DIAGNOSIS — O009 Unspecified ectopic pregnancy without intrauterine pregnancy: Secondary | ICD-10-CM | POA: Insufficient documentation

## 2016-07-30 DIAGNOSIS — R1030 Lower abdominal pain, unspecified: Secondary | ICD-10-CM | POA: Diagnosis present

## 2016-07-30 HISTORY — DX: Gonococcal infection, unspecified: A54.9

## 2016-07-30 LAB — TYPE AND SCREEN
ABO/RH(D): A POS
ANTIBODY SCREEN: NEGATIVE

## 2016-07-30 LAB — COMPREHENSIVE METABOLIC PANEL
ALK PHOS: 50 U/L (ref 38–126)
ALT: 29 U/L (ref 14–54)
ANION GAP: 4 — AB (ref 5–15)
AST: 20 U/L (ref 15–41)
Albumin: 4.2 g/dL (ref 3.5–5.0)
BUN: 11 mg/dL (ref 6–20)
CALCIUM: 9.8 mg/dL (ref 8.9–10.3)
CO2: 28 mmol/L (ref 22–32)
Chloride: 105 mmol/L (ref 101–111)
Creatinine, Ser: 0.56 mg/dL (ref 0.44–1.00)
Glucose, Bld: 96 mg/dL (ref 65–99)
Potassium: 3.7 mmol/L (ref 3.5–5.1)
SODIUM: 137 mmol/L (ref 135–145)
TOTAL PROTEIN: 7.4 g/dL (ref 6.5–8.1)
Total Bilirubin: 0.3 mg/dL (ref 0.3–1.2)

## 2016-07-30 LAB — CBC
HCT: 39.9 % (ref 36.0–46.0)
HEMOGLOBIN: 13.9 g/dL (ref 12.0–15.0)
MCH: 32 pg (ref 26.0–34.0)
MCHC: 34.8 g/dL (ref 30.0–36.0)
MCV: 91.7 fL (ref 78.0–100.0)
PLATELETS: 319 10*3/uL (ref 150–400)
RBC: 4.35 MIL/uL (ref 3.87–5.11)
RDW: 12.7 % (ref 11.5–15.5)
WBC: 7.5 10*3/uL (ref 4.0–10.5)

## 2016-07-30 LAB — POCT URINALYSIS DIP (DEVICE)
BILIRUBIN URINE: NEGATIVE
Glucose, UA: NEGATIVE mg/dL
HGB URINE DIPSTICK: NEGATIVE
Ketones, ur: NEGATIVE mg/dL
Leukocytes, UA: NEGATIVE
NITRITE: NEGATIVE
PH: 5.5 (ref 5.0–8.0)
Protein, ur: NEGATIVE mg/dL
Specific Gravity, Urine: >=1.03 (ref 1.005–1.030)
Urobilinogen, UA: 0.2 mg/dL (ref 0.0–1.0)

## 2016-07-30 LAB — HCG, QUANTITATIVE, PREGNANCY
hCG, Beta Chain, Quant, S: 9996 m[IU]/mL — ABNORMAL HIGH (ref <5)
hCG, Beta Chain, Quant, S: 8295 m[IU]/mL — ABNORMAL HIGH (ref <5)

## 2016-07-30 LAB — POCT PREGNANCY, URINE: Preg Test, Ur: POSITIVE — AB

## 2016-07-30 MED ORDER — OXYCODONE-ACETAMINOPHEN 5-325 MG PO TABS
2.0000 | ORAL_TABLET | Freq: Once | ORAL | Status: AC
  Administered 2016-07-30: 2 via ORAL
  Filled 2016-07-30: qty 2

## 2016-07-30 MED ORDER — OXYCODONE-ACETAMINOPHEN 5-325 MG PO TABS: 1.0000 | ORAL_TABLET | Freq: Four times a day (QID) | ORAL | 0 refills | Status: DC | PRN

## 2016-07-30 MED ORDER — METHOTREXATE INJECTION FOR WOMEN'S HOSPITAL
50.0000 mg/m2 | Freq: Once | INTRAMUSCULAR | Status: AC
  Administered 2016-07-30: 80 mg via INTRAMUSCULAR
  Filled 2016-07-30: qty 1.6

## 2016-07-30 MED ORDER — HYDROMORPHONE HCL 1 MG/ML IJ SOLN
1.0000 mg | Freq: Once | INTRAMUSCULAR | Status: DC
  Filled 2016-07-30: qty 1

## 2016-07-30 NOTE — MAU Note (Signed)
Pt states she is having lower left abdominal cramping.  Pt states she was sent from urgent care to rule out ectopic pregnancy.

## 2016-07-30 NOTE — ED Provider Notes (Signed)
[facility]    CSN: [id number] Arrival date & time: 07/30/16  1556     History   Chief Complaint Chief Complaint  Patient presents with  . Abdominal Pain  . Vaginal Discharge    HPI [name] [name] is a 30 y.o. female. Presents today with 3d hx constant sharp L pelvic pain.  No change in vag discharge, no vag bleeding.  [facility] 07/11/16, LMP 06/06/16, normal for her.  No prenatal care yet. Has not had an ultrasound.  No change in BMs.  No N/V.  No fever.    HPI  Past Medical History:  Diagnosis Date  . Gonorrhea   . Pregnant     Patient Active Problem List   Diagnosis Date Noted  . Vaginitis and vulvovaginitis, unspecified 03/19/2013    Past Surgical History:  Procedure Laterality Date  . CESAREAN SECTION  05/09/2012   Procedure: CESAREAN SECTION;  Surgeon: Antionette CharLisa Jackson-Moore, MD;  Location: [facility];  Service: Gynecology;  Laterality: N/A;  . WISDOM TOOTH EXTRACTION      OB History    Gravida Para Term Preterm AB Living   11 4 4  0 5 4   SAB TAB Ectopic Multiple Live Births   1 4 0 0 4       Home Medications    Prior to Admission medications   Medication Sig Start Date End Date Taking? Authorizing Provider  Prenatal Vit-Fe Fumarate-FA (PRENATAL COMPLETE) 14-0.4 MG TABS Take 1 tablet by mouth every morning. 07/11/16  Yes Elvina SidleKurt Lauenstein, MD    Family History Family History  Problem Relation Age of Onset  . Anesthesia problems Neg Hx   . Other Neg Hx     Social History Social History  Substance Use Topics  . Smoking status: Never Smoker  . Smokeless tobacco: Never Used  . Alcohol use No     Allergies   Tamiflu   Review of Systems Review of Systems  All other systems reviewed and are negative.    Physical Exam Triage Vital Signs ED Triage Vitals  Enc Vitals Group     BP 07/30/16 1639 116/65     Pulse Rate 07/30/16 1639 79     Resp 07/30/16 1639 16     Temp 07/30/16 1639 98.4 F (36.9 C)     Temp  Source 07/30/16 1639 Oral     SpO2 07/30/16 1639 100 %     Weight --      Height --      Pain Score 07/30/16 1643 9   Updated Vital Signs BP 116/65 (BP Location: Left Arm)   Pulse 79   Temp 98.4 F (36.9 C) (Oral)   Resp 16   LMP 06/06/2016 (Exact Date)   SpO2 100%  Physical Exam  Constitutional: She is oriented to person, place, and time. No distress.  Alert, nicely groomed Holding left lower quadrant, bent over in chair  HENT:  Head: Atraumatic.  Eyes:  Conjugate gaze, no eye redness/drainage  Neck: Neck supple.  Cardiovascular: Normal rate and regular rhythm.   Pulmonary/Chest: No respiratory distress. She has no wheezes. She has no rales.  Lungs clear, symmetric breath sounds  Abdominal: Soft. She exhibits no distension. There is no rebound and no guarding.  Focal tenderness to palpation left pelvis  Musculoskeletal: Normal range of motion.  No leg swelling  Neurological: She is alert and oriented to person, place, and time.  Skin: Skin is warm and dry.  No  cyanosis  Nursing note and vitals reviewed.    UC Treatments / Results  Labs Results for orders placed or performed during the hospital encounter of 07/30/16  POCT urinalysis dip (device)  Result Value Ref Range   Glucose, UA NEGATIVE NEGATIVE mg/dL   Bilirubin Urine NEGATIVE NEGATIVE   Ketones, ur NEGATIVE NEGATIVE mg/dL   Specific Gravity, Urine >=1.030 1.005 - 1.030   Hgb urine dipstick NEGATIVE NEGATIVE   pH 5.5 5.0 - 8.0   Protein, ur NEGATIVE NEGATIVE mg/dL   Urobilinogen, UA 0.2 0.0 - 1.0 mg/dL   Nitrite NEGATIVE NEGATIVE   Leukocytes, UA NEGATIVE NEGATIVE  Pregnancy, urine POC  Result Value Ref Range   Preg Test, Ur POSITIVE (A) NEGATIVE    Quant hcg pending   Procedures Procedures (including critical care time) None at the urgent care   Final Clinical Impressions(s) / UC Diagnoses   Final diagnoses:  Pelvic pain in female  Less than [redacted] weeks gestation of pregnancy   Discharging to  [facility].  Concerned about ectopic pregnancy, given new pregnancy dx, early estimated gestational age, now 3d constant sharp L pelvic pain.  No ultrasound yet.  Spoke with Dr Jolayne Pantheronstant.     Eustace MooreLaura W Shravya Wickwire, MD 07/30/16 [phone]

## 2016-07-30 NOTE — ED Triage Notes (Signed)
C/o malodorous vag discharge.  Was treated for UTI approx 2 wks ago - c/o still feeling bladder pressure.  Also c/o polyuria without dysuria.  Denies fever.

## 2016-07-30 NOTE — Discharge Instructions (Signed)
Methotrexate injection What is this medicine? METHOTREXATE (METH oh TREX ate) is a chemotherapy drug used to treat cancer including breast cancer, leukemia, and lymphoma. This medicine can also be used to treat psoriasis and certain kinds of arthritis.Also used for ectopic pregnancy This medicine may be used for other purposes; ask your health care provider or pharmacist if you have questions. What should I tell my health care provider before I take this medicine? They need to know if you have any of these conditions: -fluid in the stomach area or lungs -if you often drink alcohol -infection or immune system problems -kidney disease -liver disease -low blood counts, like low white cell, platelet, or red cell counts -lung disease -radiation therapy -stomach ulcers -ulcerative colitis -an unusual or allergic reaction to methotrexate, other medicines, foods, dyes, or preservatives -pregnant or trying to get pregnant -breast-feeding How should I use this medicine? This medicine is for infusion into a vein or for injection into muscle or into the spinal fluid (whichever applies). It is usually given by a health care professional in a hospital or clinic setting. In rare cases, you might get this medicine at home. You will be taught how to give this medicine. Use exactly as directed. Take your medicine at regular intervals. Do not take your medicine more often than directed. If this medicine is used for arthritis or psoriasis, it should be taken weekly, NOT daily. It is important that you put your used needles and syringes in a special sharps container. Do not put them in a trash can. If you do not have a sharps container, call your pharmacist or healthcare provider to get one. Talk to your pediatrician regarding the use of this medicine in children. While this drug may be prescribed for children as young as 2 years for selected conditions, precautions do apply. Overdosage: If you think you have  taken too much of this medicine contact a poison control center or emergency room at once. NOTE: This medicine is only for you. Do not share this medicine with others. What if I miss a dose? It is important not to miss your dose. Call your doctor or health care professional if you are unable to keep an appointment. If you give yourself the medicine and you miss a dose, talk with your doctor or health care professional. Do not take double or extra doses. What may interact with this medicine? This medicine may interact with the following medications: -acitretin -aspirin or aspirin-like medicines including salicylates -azathioprine -certain antibiotics like chloramphenicol, penicillin, tetracycline -certain medicines for stomach problems like esomeprazole, omeprazole, pantoprazole -cyclosporine -gold -hydroxychloroquine -live virus vaccines -mercaptopurine -NSAIDs, medicines for pain and inflammation, like ibuprofen or naproxen -other cytotoxic agents -penicillamine -phenylbutazone -phenytoin -probenacid -retinoids such as isotretinoin and tretinoin -steroid medicines like prednisone or cortisone -sulfonamides like sulfasalazine and trimethoprim/sulfamethoxazole -theophylline This list may not describe all possible interactions. Give your health care provider a list of all the medicines, herbs, non-prescription drugs, or dietary supplements you use. Also tell them if you smoke, drink alcohol, or use illegal drugs. Some items may interact with your medicine. What should I watch for while using this medicine? Avoid alcoholic drinks. In some cases, you may be given additional medicines to help with side effects. Follow all directions for their use. This medicine can make you more sensitive to the sun. Keep out of the sun. If you cannot avoid being in the sun, wear protective clothing and use sunscreen. Do not use sun lamps or tanning beds/booths.  You may get drowsy or dizzy. Do not drive,  use machinery, or do anything that needs mental alertness until you know how this medicine affects you. Do not stand or sit up quickly, especially if you are an older patient. This reduces the risk of dizzy or fainting spells. You may need blood work done while you are taking this medicine. Call your doctor or health care professional for advice if you get a fever, chills or sore throat, or other symptoms of a cold or flu. Do not treat yourself. This drug decreases your body's ability to fight infections. Try to avoid being around people who are sick. This medicine may increase your risk to bruise or bleed. Call your doctor or health care professional if you notice any unusual bleeding. Check with your doctor or health care professional if you get an attack of severe diarrhea, nausea and vomiting, or if you sweat a lot. The loss of too much body fluid can make it dangerous for you to take this medicine. Talk to your doctor about your risk of cancer. You may be more at risk for certain types of cancers if you take this medicine. Both men and women must use effective birth control with this medicine. Do not become pregnant while taking this medicine or until at least 1 normal menstrual cycle has occurred after stopping it. Women should inform their doctor if they wish to become pregnant or think they might be pregnant. Men should not father a child while taking this medicine and for 3 months after stopping it. There is a potential for serious side effects to an unborn child. Talk to your health care professional or pharmacist for more information. Do not breast-feed an infant while taking this medicine. What side effects may I notice from receiving this medicine? Side effects that you should report to your doctor or health care professional as soon as possible: -allergic reactions like skin rash, itching or hives, swelling of the face, lips, or tongue -back pain -breathing problems or shortness of  breath -confusion -diarrhea -dry, nonproductive cough -low blood counts - this medicine may decrease the number of white blood cells, red blood cells and platelets. You may be at increased risk of infections and bleeding -mouth sores -redness, blistering, peeling or loosening of the skin, including inside the mouth -seizures -severe headaches -signs of infection - fever or chills, cough, sore throat, pain or difficulty passing urine -signs and symptoms of bleeding such as bloody or black, tarry stools; red or dark-brown urine; spitting up blood or brown material that looks like coffee grounds; red spots on the skin; unusual bruising or bleeding from the eye, gums, or nose -signs and symptoms of kidney injury like trouble passing urine or change in the amount of urine -signs and symptoms of liver injury like dark yellow or brown urine; general ill feeling or flu-like symptoms; light-colored stools; loss of appetite; nausea; right upper belly pain; unusually weak or tired; yellowing of the eyes or skin -stiff neck -vomiting Side effects that usually do not require medical attention (report to your doctor or health care professional if they continue or are bothersome): -dizziness -hair loss -headache -stomach pain -upset stomach This list may not describe all possible side effects. Call your doctor for medical advice about side effects. You may report side effects to FDA at 1-800-FDA-1088. Where should I keep my medicine? If you are using this medicine at home, you will be instructed on how to store this medicine. Throw away  any unused medicine after the expiration date on the label. NOTE: This sheet is a summary. It may not cover all possible information. If you have questions about this medicine, talk to your doctor, pharmacist, or health care provider.    2016, Elsevier/Gold Standard. (2014-12-25 12:36:41) Ectopic Pregnancy An ectopic pregnancy is when the fertilized egg attaches  (implants) outside the uterus. Most ectopic pregnancies occur in the fallopian tube. Rarely do ectopic pregnancies occur on the ovary, intestine, pelvis, or cervix. In an ectopic pregnancy, the fertilized egg does not have the ability to develop into a normal, healthy baby.  A ruptured ectopic pregnancy is one in which the fallopian tube gets torn or bursts and results in internal bleeding. Often there is intense abdominal pain, and sometimes, vaginal bleeding. Having an ectopic pregnancy can be life threatening. If left untreated, this dangerous condition can lead to a blood transfusion, abdominal surgery, or even death. CAUSES  Damage to the fallopian tubes is the suspected cause in most ectopic pregnancies.  RISK FACTORS Depending on your circumstances, the risk of having an ectopic pregnancy will vary. The level of risk can be divided into three categories. High Risk  You have gone through infertility treatment.  You have had a previous ectopic pregnancy.  You have had previous tubal surgery.  You have had previous surgery to have the fallopian tubes tied (tubal ligation).  You have tubal problems or diseases.  You have been exposed to DES. DES is a medicine that was used until 1971 and had effects on babies whose mothers took the medicine.  You become pregnant while using an intrauterine device (IUD) for birth control. Moderate Risk  You have a history of infertility.  You have a history of a sexually transmitted infection (STI).  You have a history of pelvic inflammatory disease (PID).  You have scarring from endometriosis.  You have multiple sexual partners.  You smoke. Low Risk  You have had previous pelvic surgery.  You use vaginal douching.  You became sexually active before 32 years of age. SIGNS AND SYMPTOMS  An ectopic pregnancy should be suspected in anyone who has missed a period and has abdominal pain or bleeding.  You may experience normal pregnancy  symptoms, such as:  Nausea.  Tiredness.  Breast tenderness.  Other symptoms may include:  Pain with intercourse.  Irregular vaginal bleeding or spotting.  Cramping or pain on one side or in the lower abdomen.  Fast heartbeat.  Passing out while having a bowel movement.  Symptoms of a ruptured ectopic pregnancy and internal bleeding may include:  Sudden, severe pain in the abdomen and pelvis.  Dizziness or fainting.  Pain in the shoulder area. DIAGNOSIS  Tests that may be performed include:  A pregnancy test.  An ultrasound test.  Testing the specific level of pregnancy hormone in the bloodstream.  Taking a sample of uterus tissue (dilation and curettage, D&C).  Surgery to perform a visual exam of the inside of the abdomen using a thin, lighted tube with a tiny camera on the end (laparoscope). TREATMENT  An injection of a medicine called methotrexate may be given. This medicine causes the pregnancy tissue to be absorbed. It is given if:  The diagnosis is made early.  The fallopian tube has not ruptured.  You are considered to be a good candidate for the medicine. Usually, pregnancy hormone blood levels are checked after methotrexate treatment. This is to be sure the medicine is effective. It may take 4-6 weeks for  the pregnancy to be absorbed (though most pregnancies will be absorbed by 3 weeks). Surgical treatment may be needed. A laparoscope may be used to remove the pregnancy tissue. If severe internal bleeding occurs, a cut (incision) may be made in the lower abdomen (laparotomy), and the ectopic pregnancy is removed. This stops the bleeding. Part of the fallopian tube, or the whole tube, may be removed as well (salpingectomy). After surgery, pregnancy hormone tests may be done to be sure there is no pregnancy tissue left. You may receive a Rho (D) immune globulin shot if you are Rh negative and the father is Rh positive, or if you do not know the Rh type of the  father. This is to prevent problems with any future pregnancy. SEEK IMMEDIATE MEDICAL CARE IF:  You have any symptoms of an ectopic pregnancy. This is a medical emergency. MAKE SURE YOU:  Understand these instructions.  Will watch your condition.  Will get help right away if you are not doing well or get worse.   This information is not intended to replace advice given to you by your health care provider. Make sure you discuss any questions you have with your health care provider.   Document Released: 10/13/2004 Document Revised: 09/26/2014 Document Reviewed: 04/04/2013 Elsevier Interactive Patient Education [employer]2016 [employer] Inc.

## 2016-07-30 NOTE — MAU Provider Note (Signed)
Chief Complaint: Abdominal Pain   First Provider Initiated Contact with Patient 07/30/16 1902        SUBJECTIVE HPI: [name] [name] is a 30 y.o. [id number] at 2864w5d by LMP who presents to maternity admissions reporting worsening pain in Left lower quadrant. Started a few days ago and got much worse today.  . She denies vaginal bleeding, vaginal itching/burning, urinary symptoms, h/a, dizziness, n/v, or fever/chills.  She was seen at Urgent Care earlier. They drew blood and the HCG quant is pending  Abdominal Pain  This is a new problem. The current episode started in the past 7 days. The problem occurs constantly. The problem has been gradually worsening. The pain is located in the LLQ. The pain is severe. The quality of the pain is cramping and sharp. The abdominal pain does not radiate. Pertinent negatives include no constipation, diarrhea, dysuria, fever, nausea or vomiting. The pain is aggravated by palpation. The pain is relieved by nothing. She has tried nothing for the symptoms.   RN Note: Pt states she is having lower left abdominal cramping.  Pt states she was sent from urgent care to rule out ectopic pregnancy.  Past Medical History:  Diagnosis Date  . Gonorrhea   . Pregnant    Past Surgical History:  Procedure Laterality Date  . CESAREAN SECTION  05/09/2012   Procedure: CESAREAN SECTION;  Surgeon: Antionette CharLisa Jackson-Moore, MD;  Location: [facility];  Service: Gynecology;  Laterality: N/A;  . WISDOM TOOTH EXTRACTION     Social History   Social History  . Marital status: Single    Spouse name: N/A  . Number of children: N/A  . Years of education: N/A   Occupational History  . Not on file.   Social History Main Topics  . Smoking status: Never Smoker  . Smokeless tobacco: Never Used  . Alcohol use No  . Drug use: No  . Sexual activity: Yes    Birth control/ protection: Other-see comments     Comment: pregnant   Other Topics Concern  . Not on file   Social History Narrative   . No narrative on file   No current facility-administered medications on file prior to encounter.    Current Outpatient Prescriptions on File Prior to Encounter  Medication Sig Dispense Refill  . Prenatal Vit-Fe Fumarate-FA (PRENATAL COMPLETE) 14-0.4 MG TABS Take 1 tablet by mouth every morning. 60 each 2   Allergies  Allergen Reactions  . Tamiflu Rash    I have reviewed patient's Past Medical Hx, Surgical Hx, Family Hx, Social Hx, medications and allergies.   ROS:  Review of Systems  Constitutional: Negative for chills and fever.  Gastrointestinal: Positive for abdominal pain. Negative for constipation, diarrhea, nausea and vomiting.  Genitourinary: Positive for pelvic pain (on left). Negative for dysuria and vaginal bleeding.  Musculoskeletal: Negative for back pain.  Neurological: Negative for dizziness, syncope and light-headedness.   Review of Systems  Other systems negative   Physical Exam  Patient Vitals for the past 24 hrs:  BP Temp Temp src Pulse Resp SpO2  07/30/16 1851 114/64 98.1 F (36.7 C) Oral 89 16 100 %    Physical Exam  Constitutional: Well-developed, well-nourished female in no acute distress, but quite uncomfortable appearing, curled up.  Cardiovascular: normal rate and rhythm Respiratory: normal effort GI: Abd soft, tender over LLQ, but no rebound.   Slight guarding. Pos BS x 4 MS: Extremities nontender, no edema, normal ROM Neurologic: Alert and oriented x 4.  GU:  Neg CVAT.  PELVIC EXAM:  Cervix long and closed. Uterus not enlarged.  Tender at  LLQ adnexal region    LAB RESULTS Results for orders placed or performed during the hospital encounter of 07/30/16 (from the past 24 hour(s))  CBC     Status: None   Collection Time: 07/30/16  7:04 PM  Result Value Ref Range   WBC 7.5 4.0 - 10.5 K/uL   RBC 4.35 3.87 - 5.11 MIL/uL   Hemoglobin 13.9 12.0 - 15.0 g/dL   HCT 69.639.9 29.536.0 - 28.446.0 %   MCV 91.7 78.0 - 100.0 fL   MCH 32.0 26.0 - 34.0 pg    MCHC 34.8 30.0 - 36.0 g/dL   RDW 13.212.7 44.011.5 - 10.215.5 %   Platelets 319 150 - 400 K/uL    Ref. Range 07/30/2016 17:57  HCG, Beta Chain, Quant, S Latest Ref Range: <5 mIU/mL 9,996 (H)    Ref. Range 07/30/2016 19:04  ABO/RH(D) Unknown A POS   IMAGING [place] Ob Comp Less 14 Wks  Result Date: 07/30/2016 CLINICAL DATA:  Vaginal pain. Estimated gestational age by last menstrual period equals 7 weeks 5 days. EXAM: OBSTETRIC <14 WK US AND TRANSVAGINAL OB US TECHNIQUE: Both transabdominal and transvaginal ultrasound examinations were performed for complete evaluation of the gestation as well as the maternal uterus, adnexal regions, and pelvic cul-de-sac. Transvaginal technique was performed to assess early pregnancy. COMPARISON:  None. FINDINGS: Intrauterine gestational sac: Not identified Yolk sac:  Not identify Embryo:  Not addendum Subchorionic hemorrhage:  None visualized. Maternal uterus/adnexae: [email or web address] Corpus luteal cyst the RIGHT ovary.  Normal LEFT ovary. Along the LEFT lateral border of the uterus there is a rounded hyperechoic lesion with minimal peripheral vascularity measuring 2.6 x 1.6 cm. This lesion has a central hypoechoic cavity. Finding is concerning for a cornuate ectopic pregnancy. Patient extremely tender at this location. IMPRESSION: 1. Findings are concerning for cornuate ectopic pregnancy on the LEFT. Patient was extremely tender at this location. 2. No intrauterine gestation identified. 3. Corpus luteal cyst of the RIGHT ovary. 4. No free fluid. Findings conveyed toMARIE Trenese Haft on 07/30/2016  at20:35. Electronically Signed   By: Genevive BiStewart  Edmunds M.D.   On: 07/30/2016 20:41   [place] Ob Transvaginal  Result Date: 07/30/2016 CLINICAL DATA:  Vaginal pain. Estimated gestational age by last menstrual period equals 7 weeks 5 days. EXAM: OBSTETRIC <14 WK US AND TRANSVAGINAL OB US TECHNIQUE: Both transabdominal and transvaginal ultrasound examinations were performed for complete  evaluation of the gestation as well as the maternal uterus, adnexal regions, and pelvic cul-de-sac. Transvaginal technique was performed to assess early pregnancy. COMPARISON:  None. FINDINGS: Intrauterine gestational sac: Not identified Yolk sac:  Not identify Embryo:  Not addendum Subchorionic hemorrhage:  None visualized. Maternal uterus/adnexae: [email or web address] Corpus luteal cyst the RIGHT ovary.  Normal LEFT ovary. Along the LEFT lateral border of the uterus there is a rounded hyperechoic lesion with minimal peripheral vascularity measuring 2.6 x 1.6 cm. This lesion has a central hypoechoic cavity. Finding is concerning for a cornuate ectopic pregnancy. Patient extremely tender at this location. IMPRESSION: 1. Findings are concerning for cornuate ectopic pregnancy on the LEFT. Patient was extremely tender at this location. 2. No intrauterine gestation identified. 3. Corpus luteal cyst of the RIGHT ovary. 4. No free fluid. Findings conveyed toMARIE Yennifer Segovia on 07/30/2016  at20:35. Electronically Signed   By: Genevive BiStewart  Edmunds M.D.   On: 07/30/2016 20:41    MAU Management/MDM: Ordered usual first trimester r/o ectopic labs.  Pelvic exam and cultures done Will check baseline Ultrasound to rule out ectopic.  Cultures were done to rule out pelvic infection Blood drawn for Quant HCG, CBC, ABO/Rh  Consult Dr Jolayne Panther with presentation, exam findings, and results.  She recommends Methotrexate administration for treatment of ectopic pregnancy.  Treatments in MAU included Methotrexate, analgesic (Percocet).   This bleeding/pain can represent a normal pregnancy with bleeding, spontaneous abortion or even an ectopic which can be life-threatening.  The process as listed above helps to determine which of these is present.  Findings from ultrasound indicate left ectopic pregnancy in the cornual region.  Methotrexate given after explanation of ectopic and potential for dire consequences if untreated  Patient  agrees to proceed with Methotrexate injection. Informed her of need to get Day #4 and Day #7 HCG levels and also discussed warning signs for rupture of ectopic pregnancy, including the life-threatening nature of such an occurrence. Patient states understanding.   She requested to get a dose of Percocet before leaving because she doesn't know if she can afford to get it at pharmacy. Dose given. Patient had some lightheadedness and one episode of vomiting afteward, but RN reassessed her and found her BP to be normal and patient stated she felt much better.    ASSESSMENT 1. Pelvic pain in pregnant patient at less than [redacted] weeks gestation   2. Pelvic pain in pregnant patient at less than [redacted] weeks gestation   3.     Left ectopic pregnancy  PLAN Discharge home Plan to repeat HCG level in 3 days and then again on Day #7.  Pt stable at time of discharge. Encouraged to return here or to other Urgent Care/ED if she develops worsening of symptoms, increase in pain, fever, or other concerning symptoms.    Wynelle Bourgeois CNM, MSN Certified Nurse-Midwife 07/30/2016  7:54 PM

## 2016-07-30 NOTE — Discharge Instructions (Signed)
[facility] to determine if left pelvic pain is due to ectopic pregnancy.  I have spoken with Dr Jolayne Pantheronstant.

## 2016-07-31 LAB — HIV ANTIBODY (ROUTINE TESTING W REFLEX): HIV SCREEN 4TH GENERATION: NONREACTIVE

## 2016-08-02 ENCOUNTER — Encounter (HOSPITAL_COMMUNITY): Payer: Self-pay | Admitting: Obstetrics and Gynecology

## 2016-08-02 ENCOUNTER — Encounter: Payer: Self-pay | Admitting: Obstetrics and Gynecology

## 2016-08-02 ENCOUNTER — Inpatient Hospital Stay (HOSPITAL_COMMUNITY)
Admission: AD | Admit: 2016-08-02 | Discharge: 2016-08-02 | Disposition: A | Discharge status: Home / Self Care | Payer: Medicaid Other | Source: Ambulatory Visit | Attending: Obstetrics and Gynecology | Admitting: Obstetrics and Gynecology

## 2016-08-02 DIAGNOSIS — O008 Other ectopic pregnancy without intrauterine pregnancy: Secondary | ICD-10-CM | POA: Insufficient documentation

## 2016-08-02 DIAGNOSIS — O009 Unspecified ectopic pregnancy without intrauterine pregnancy: Secondary | ICD-10-CM

## 2016-08-02 HISTORY — DX: Other ectopic pregnancy without intrauterine pregnancy: O00.80

## 2016-08-02 LAB — HCG, QUANTITATIVE, PREGNANCY: hCG, Beta Chain, Quant, S: 3360 m[IU]/mL — ABNORMAL HIGH (ref <5)

## 2016-08-02 MED ORDER — BUTALBITAL-APAP-CAFFEINE 50-325-40 MG PO TABS: 1.0000 | ORAL_TABLET | Freq: Four times a day (QID) | ORAL | 0 refills | Status: DC | PRN

## 2016-08-02 NOTE — MAU Note (Signed)
Here for post MTX 4 day f/u. Still having pain, is the same.  Now is having migraines.  Pain medicine helps, but then it comes right back.

## 2016-08-02 NOTE — MAU Provider Note (Addendum)
MAU Note  I came down to make sure patient is doing well  VS normal and stable and beta is 3360 today.   Patient had MTX single dose protocol given to her on 11/11 for left cornual ectopic pregnancy with beta of 9996. Pt states LMP is for sure on 9/18 and not on any BC and had s/s for a few days prior to presenting on 11/11. Patient states that pain is improved and has gone from constant to coming and going and she only use two of the pain pills that she was given per day (six remaining and she states she was given 10) and she does have a slight HA but states she hasn't taken anything OTC b/c she was told not to b/c of the MTX   Patient is well appearing and no peritoneal s/s. D/w her seriousness of ectopic pregnancy nature and s/s to look out for and to come in the ER ASAP with any issues. Pt to come in on Friday for D7 and will set her up to come to clinic next week for plan of care visit; pt desires BC at that time. G and P updated and she is a [id number] with one SAB.   Pt fine for d/c to home. Will give some fioricet for HA. Pt told okay to go back to work tomorrow.   Cornelia Copaharlie Nicoli Nardozzi, Jr MD Attending [facility](Faculty Practice)

## 2016-08-02 NOTE — Discharge Instructions (Signed)
Ectopic Pregnancy °An ectopic pregnancy happens when a fertilized egg grows outside the uterus. A pregnancy cannot live outside of the uterus. This problem often happens in the fallopian tube. It is often caused by damage to the fallopian tube. °If this problem is found early, you may be treated with medicine. If your tube tears or bursts open (ruptures), you will bleed inside. This is an emergency. You will need surgery. Get help right away. °What are the signs or symptoms? °You may have normal pregnancy symptoms at first. These include: °· Missing your period. °· Feeling sick to your stomach (nauseous). °· Being tired. °· Having tender breasts. ° °Then, you may start to have symptoms that are not normal. These include: °· Pain with sex (intercourse). °· Bleeding from the vagina. This includes light bleeding (spotting). °· Belly (abdomen) or lower belly cramping or pain. This may be felt on one side. °· A fast heartbeat (pulse). °· Passing out (fainting) after going poop (bowel movement). ° °If your tube tears, you may have symptoms such as: °· Really bad pain in the belly or lower belly. This happens suddenly. °· Dizziness. °· Passing out. °· Shoulder pain. ° °Get help right away if: °You have any of these symptoms. This is an emergency. °This information is not intended to replace advice given to you by your health care provider. Make sure you discuss any questions you have with your health care provider. °Document Released: 12/02/2008 Document Revised: 02/11/2016 Document Reviewed: 04/17/2013 °Elsevier Interactive Patient Education © 2017 Elsevier Inc. ° °

## 2016-08-04 ENCOUNTER — Inpatient Hospital Stay (HOSPITAL_COMMUNITY): Payer: Medicaid Other

## 2016-08-04 ENCOUNTER — Encounter (HOSPITAL_COMMUNITY): Payer: Self-pay | Admitting: *Deleted

## 2016-08-04 ENCOUNTER — Inpatient Hospital Stay (HOSPITAL_COMMUNITY)
Admission: AD | Admit: 2016-08-04 | Discharge: 2016-08-04 | Disposition: A | Discharge status: Home / Self Care | Payer: Medicaid Other | Source: Ambulatory Visit | Attending: Obstetrics & Gynecology | Admitting: Obstetrics & Gynecology

## 2016-08-04 DIAGNOSIS — R109 Unspecified abdominal pain: Secondary | ICD-10-CM

## 2016-08-04 DIAGNOSIS — O009 Unspecified ectopic pregnancy without intrauterine pregnancy: Secondary | ICD-10-CM

## 2016-08-04 DIAGNOSIS — O008 Other ectopic pregnancy without intrauterine pregnancy: Secondary | ICD-10-CM | POA: Diagnosis not present

## 2016-08-04 DIAGNOSIS — N939 Abnormal uterine and vaginal bleeding, unspecified: Secondary | ICD-10-CM | POA: Diagnosis present

## 2016-08-04 DIAGNOSIS — O26899 Other specified pregnancy related conditions, unspecified trimester: Secondary | ICD-10-CM

## 2016-08-04 LAB — TYPE AND SCREEN
ABO/RH(D): A POS
ANTIBODY SCREEN: NEGATIVE

## 2016-08-04 LAB — URINALYSIS, ROUTINE W REFLEX MICROSCOPIC
Bilirubin Urine: NEGATIVE
GLUCOSE, UA: NEGATIVE mg/dL
KETONES UR: NEGATIVE mg/dL
Nitrite: NEGATIVE
PROTEIN: NEGATIVE mg/dL
Specific Gravity, Urine: 1.025 (ref 1.005–1.030)
pH: 6 (ref 5.0–8.0)

## 2016-08-04 LAB — CBC
HEMATOCRIT: 37.9 % (ref 36.0–46.0)
Hemoglobin: 12.9 g/dL (ref 12.0–15.0)
MCH: 31.3 pg (ref 26.0–34.0)
MCHC: 34 g/dL (ref 30.0–36.0)
MCV: 92 fL (ref 78.0–100.0)
Platelets: 323 10*3/uL (ref 150–400)
RBC: 4.12 MIL/uL (ref 3.87–5.11)
RDW: 12.4 % (ref 11.5–15.5)
WBC: 6.7 10*3/uL (ref 4.0–10.5)

## 2016-08-04 LAB — URINE MICROSCOPIC-ADD ON

## 2016-08-04 LAB — HCG, QUANTITATIVE, PREGNANCY: hCG, Beta Chain, Quant, S: 3634 m[IU]/mL — ABNORMAL HIGH (ref <5)

## 2016-08-04 MED ORDER — OXYCODONE-ACETAMINOPHEN 5-325 MG PO TABS: 1.0000 | ORAL_TABLET | Freq: Four times a day (QID) | ORAL | 0 refills | Status: DC | PRN

## 2016-08-04 MED ORDER — METHOTREXATE INJECTION FOR WOMEN'S HOSPITAL
50.0000 mg/m2 | Freq: Once | INTRAMUSCULAR | Status: AC
  Administered 2016-08-04: 80 mg via INTRAMUSCULAR
  Filled 2016-08-04: qty 1.6

## 2016-08-04 NOTE — Discharge Instructions (Signed)
Methotrexate Treatment for an Ectopic Pregnancy, Care After Refer to this sheet in the next few weeks. These instructions provide you with information on caring for yourself after your procedure. Your health care provider may also give you more specific instructions. Your treatment has been planned according to current medical practices, but problems sometimes occur. Call your health care provider if you have any problems or questions after your procedure. WHAT TO EXPECT AFTER THE PROCEDURE You may have some abdominal cramping, vaginal bleeding, and fatigue in the first few days after taking methotrexate. Some other possible side effects of methotrexate include:  Nausea.  Vomiting.  Diarrhea.  Mouth sores.  Swelling or irritation of the lining of your lungs (pneumonitis).  Liver damage.  Hair loss. HOME CARE INSTRUCTIONS  After you have received the methotrexate medicine, you need to be careful of your activities and watch your condition for several weeks. It may take 1 week before your hormone levels return to normal.  Keep all follow-up appointments as directed by your health care provider.  Avoid traveling too far away from your health care provider.  Do not have sexual intercourse until your health care provider says it is safe to do so.  You may resume your usual diet.  Limit strenuous activity.  Do not take folic acid, prenatal vitamins, or other vitamins that contain folic acid.  Do not take aspirin, ibuprofen, or naproxen (nonsteroidal anti-inflammatory drugs [NSAIDs]).  Do not drink alcohol. SEEK MEDICAL CARE IF:   You cannot control your nausea and vomiting.  You cannot control your diarrhea.  You have sores in your mouth and want treatment.  You need pain medicine for your abdominal pain.  You have a rash.  You are having a reaction to the medicine. SEEK IMMEDIATE MEDICAL CARE IF:   You have increasing abdominal or pelvic pain.  You notice increased  bleeding.  You feel light-headed, or you faint.  You have shortness of breath.  Your heart rate increases.  You have a cough.  You have chills.  You have a fever. This information is not intended to replace advice given to you by your health care provider. Make sure you discuss any questions you have with your health care provider. Document Released: 08/25/2011 Document Revised: 09/10/2013 Document Reviewed: 06/24/2013 Elsevier Interactive Patient Education  2017 Elsevier Inc. Ectopic Pregnancy An ectopic pregnancy is when the fertilized egg attaches (implants) outside the uterus. Most ectopic pregnancies occur in one of the tubes where eggs travel from the ovary to the uterus (fallopian tubes), but the implanting can occur in other locations. In rare cases, ectopic pregnancies occur on the ovary, intestine, pelvis, abdomen, or cervix. In an ectopic pregnancy, the fertilized egg does not have the ability to develop into a normal, healthy baby. A ruptured ectopic pregnancy is one in which tearing or bursting of a fallopian tube causes internal bleeding. Often, there is intense lower abdominal pain, and vaginal bleeding sometimes occurs. Having an ectopic pregnancy can be life-threatening. If this dangerous condition is not treated, it can lead to blood loss, shock, or even death. What are the causes? The most common cause of this condition is damage to one of the fallopian tubes. A fallopian tube may be narrowed or blocked, and that keeps the fertilized egg from reaching the uterus. What increases the risk? This condition is more likely to develop in women of childbearing age who have different levels of risk. The levels of risk can be divided into three categories. High  risk  You have gone through infertility treatment.  You have had an ectopic pregnancy before.  You have had surgery on the fallopian tubes, or another surgical procedure, such as an abortion.  You have had surgery to  have the fallopian tubes tied (tubal ligation).  You have problems or diseases of the fallopian tubes.  You have been exposed to diethylstilbestrol (DES). This medicine was used until 1971, and it had effects on babies whose mothers took the medicine.  You become pregnant while using an IUD (intrauterine device) for birth control. Moderate risk  You have a history of infertility.  You have had an STI (sexually transmitted infection).  You have a history of pelvic inflammatory disease (PID).  You have scarring from endometriosis.  You have multiple sexual partners.  You smoke. Low risk  You have had pelvic surgery.  You use vaginal douches.  You became sexually active before age 32. What are the signs or symptoms? Common symptoms of this condition include normal pregnancy symptoms, such as missing a period, nausea, tiredness, abdominal pain, breast tenderness, and bleeding. However, ectopic pregnancy will have additional symptoms, such as:  Pain with intercourse.  Irregular vaginal bleeding or spotting.  Cramping or pain on one side or in the lower abdomen.  Fast heartbeat, low blood pressure, and sweating.  Passing out while having a bowel movement. Symptoms of a ruptured ectopic pregnancy and internal bleeding may include:  Sudden, severe pain in the abdomen and pelvis.  Dizziness, weakness, light-headedness, or fainting.  Pain in the shoulder or neck area. How is this diagnosed? This condition is diagnosed by:  A pelvic exam to locate pain or a mass in the abdomen.  A pregnancy test. This blood test checks for the presence as well as the specific level of pregnancy hormone in the bloodstream.  Ultrasound. This is performed if a pregnancy test is positive. In this test, a probe is inserted into the vagina. The probe will detect a fetus, possibly in a location other than the uterus.  Taking a sample of uterus tissue (dilation and curettage, or D&C).  Surgery  to perform a visual exam of the inside of the abdomen using a thin, lighted tube that has a tiny camera on the end (laparoscope).  Culdocentesis. This procedure involves inserting a needle at the top of the vagina, behind the uterus. If blood is present in this area, it may indicate that a fallopian tube is torn. How is this treated? This condition is treated with medicine or surgery. Medicine  An injection of a medicine (methotrexate) may be given to cause the pregnancy tissue to be absorbed. This medicine may save your fallopian tube. It may be given if:  The diagnosis is made early, with no signs of active bleeding.  The fallopian tube has not ruptured.  You are considered to be a good candidate for the medicine. Usually, pregnancy hormone blood levels are checked after methotrexate treatment. This is to be sure that the medicine is effective. It may take 4-6 weeks for the pregnancy to be absorbed. Most pregnancies will be absorbed by 3 weeks. Surgery  A laparoscope may be used to remove the pregnancy tissue.  If severe internal bleeding occurs, a larger cut (incision) may be made in the lower abdomen (laparotomy) to remove the fetus and placenta. This is done to stop the bleeding.  Part or all of the fallopian tube may be removed (salpingectomy) along with the fetus and placenta. The fallopian tube may also  be repaired during the surgery.  In very rare circumstances, removal of the uterus (hysterectomy) may be required.  After surgery, pregnancy hormone testing may be done to be sure that there is no pregnancy tissue left. Whether your treatment is medicine or surgery, you may receive a Rho (D) immune globulin shot to prevent problems with any future pregnancy. This shot may be given if:  You are Rh-negative and the baby's father is Rh-positive.  You are Rh-negative and you do not know the Rh type of the baby's father. Follow these instructions at home:  Rest and limit your  activity after the procedure for as long as told by your health care provider.  Until your health care provider says that it is safe:  Do not lift anything that is heavier than 10 lb (4.5 kg), or the limit that your health care provider tells you.  Avoid physical exercise and any movement that requires effort (is strenuous).  To help prevent constipation:  Eat a healthy diet that includes fruits, vegetables, and whole grains.  Drink 6-8 glasses of water per day. Get help right away if:  You develop worsening pain that is not relieved by medicine.  You have:  A fever or chills.  Vaginal bleeding.  Redness and swelling at the incision site.  Nausea and vomiting.  You feel dizzy or weak.  You feel light-headed or you faint. This information is not intended to replace advice given to you by your health care provider. Make sure you discuss any questions you have with your health care provider. Document Released: 10/13/2004 Document Revised: 05/04/2016 Document Reviewed: 04/06/2016 Elsevier Interactive Patient Education  2017 [employer]

## 2016-08-04 NOTE — MAU Provider Note (Signed)
History     CSN: [id number]  Arrival date and time: 08/04/16 [id number]   First Provider Initiated Contact with Patient 08/04/16 1007      Chief Complaint  Patient presents with  . Vaginal Bleeding  . Abdominal Pain   Vaginal Bleeding  The patient's primary symptoms include genital itching and vaginal bleeding. The patient's pertinent negatives include no genital odor, genital rash or vaginal discharge. This is a chronic problem. The current episode started in the past 7 days. The problem occurs constantly. The problem has been unchanged. The pain is moderate. The problem affects the left side. She is pregnant. Associated symptoms include abdominal pain and back pain. Pertinent negatives include no anorexia, constipation, diarrhea, dysuria, fever, flank pain, headaches, hematuria, nausea, rash or vomiting. The vaginal discharge was bloody. The vaginal bleeding is typical of menses. She has not been passing clots. She has not been passing tissue. Exacerbated by: walking, sitting. She has tried oral narcotics for the symptoms. The treatment provided moderate relief. It is unknown whether or not her partner has an STD. Her menstrual history has been regular. Her past medical history is significant for a Cesarean section and an ectopic pregnancy.  Abdominal Pain  Pertinent negatives include no anorexia, constipation, diarrhea, dysuria, fever, headaches, hematuria, nausea or vomiting.    OB History    Gravida Para Term Preterm AB Living   13 4 4  0 8 4   SAB TAB Ectopic Multiple Live Births   1 4 1  0 4      Obstetric Comments   SAB x 1. Ectopic x 1. TAB x 6.       Past Medical History:  Diagnosis Date  . Gonorrhea     Past Surgical History:  Procedure Laterality Date  . CESAREAN SECTION  05/09/2012   Procedure: CESAREAN SECTION;  Surgeon: Antionette CharLisa Jackson-Moore, MD;  Location: [facility];  Service: Gynecology;  Laterality: N/A;  . WISDOM TOOTH EXTRACTION      Family History  Problem Relation  Age of Onset  . Anesthesia problems Neg Hx   . Other Neg Hx     Social History  Substance Use Topics  . Smoking status: Never Smoker  . Smokeless tobacco: Never Used  . Alcohol use No    Allergies:  Allergies  Allergen Reactions  . Tamiflu Rash    Prescriptions Prior to Admission  Medication Sig Dispense Refill Last Dose  . butalbital-acetaminophen-caffeine (FIORICET, ESGIC) 50-325-40 MG tablet Take 1-2 tablets by mouth every 6 (six) hours as needed for headache. 20 tablet 0 08/03/2016 at Unknown time  . Prenatal Vit-Fe Fumarate-FA (PRENATAL COMPLETE) 14-0.4 MG TABS Take 1 tablet by mouth every morning. (Patient not taking: Reported on 08/04/2016) 60 each 2 Not Taking at Unknown time    Review of Systems  Constitutional: Positive for malaise/fatigue. Negative for fever.  Gastrointestinal: Positive for abdominal pain. Negative for anorexia, constipation, diarrhea, nausea and vomiting.       LLQ abdominal pain   Genitourinary: Positive for vaginal bleeding. Negative for dysuria, flank pain, hematuria and vaginal discharge.  Musculoskeletal: Positive for back pain.       Left lumbar region pain  Skin: Negative for rash.  Neurological: Positive for weakness. Negative for headaches.   Physical Exam   Blood pressure 116/77, pulse 97, temperature 97.6 F (36.4 C), temperature source Oral, resp. rate 16, height 4\' 11"  (1.499 m), weight 61.2 kg (135 lb), last menstrual period 06/06/2016, unknown if currently breastfeeding.  Physical Exam  Constitutional:  She is oriented to person, place, and time. She appears well-developed and well-nourished.  HENT:  Head: Normocephalic.  Eyes: EOM are normal.  Neck: Normal range of motion.  Respiratory: Effort normal.  GI: Soft. Normal appearance. She exhibits no distension, no ascites, no pulsatile midline mass and no mass. There is tenderness in the left lower quadrant. There is no rigidity, no rebound and no guarding.  Musculoskeletal:  Normal range of motion.  Neurological: She is alert and oriented to person, place, and time.  Skin: Skin is warm and dry.  Psychiatric: She has a normal mood and affect. Her behavior is normal. Judgment and thought content normal.    MAU Course  Procedures  MDM Quantitative hCg increased from 3360 to 3634, additional dose of methotrexate given.   Assessment and Plan  1. Ectopic pregancy, cornual  - Methotrexate second dose given IM.  - Advised patient of reasons to return to MAU, otherwise follow up in four days for repeat labs.   Travell Desaulniers 08/04/2016, 10:08 AM

## 2016-08-04 NOTE — MAU Note (Signed)
Pt had MTX on 11/11, bleeding started last night - like a period.  LLQ pain has continued the whole time, back pain also started last night.

## 2016-08-04 NOTE — MAU Provider Note (Signed)
History   [id number]   Chief Complaint  Patient presents with  . Vaginal Bleeding  . Abdominal Pain    HPI [name] [name] is a 30 y.o. female [id number] here for follow-up BHCG. Patient was treated for left cornual ectopic pregnancy with methotrexate on 11/11. Day 4 labs showed and appropriate drop in bhcg although pt had continue abdominal pain. Patient was evaluated on that day by Dr. Vergie LivingPickens & discharged home to return for day 7 labs. Patient presents today for continued abdominal pain and vaginal spotting. States the pain is not getting worse then before, "just isn't getting better". Rates pain 7-9/10. Was taking percocet with relief but has run out of that medication.   Patient's last menstrual period was 06/06/2016 (exact date).  OB History  Gravida Para Term Preterm AB Living  13 4 4  0 8 4  SAB TAB Ectopic Multiple Live Births  1 4 1  0 4    # Outcome Date GA Lbr Len/2nd Weight Sex Delivery Anes PTL Lv  13 Current           12 Term 05/09/12 2516w5d / 10:58 9 lb 13.3 oz (4.46 kg) M CS-LVertical EPI  LIV     Birth Comments: WNL   11 TAB 2010          10 TAB 2009          9 TAB 2009          8 TAB 2008          7 Term 2007 8844w0d 03:00 6 lb 7 oz (2.92 kg) F Vag-Spont EPI  LIV  6 Term 2005 6944w0d 06:00 5 lb 5 oz (2.41 kg) M Vag-Spont EPI  LIV  5 Term 2003 6844w0d 23:00 8 lb (3.629 kg) F Vag-Spont EPI  LIV  4 SAB 2002          3 AB           2 AB           1 Ectopic             Obstetric Comments  SAB x 1. Ectopic x 1. TAB x 6.     Past Medical History:  Diagnosis Date  . Gonorrhea     Family History  Problem Relation Age of Onset  . Anesthesia problems Neg Hx   . Other Neg Hx     Social History   Social History  . Marital status: Single    Spouse name: N/A  . Number of children: N/A  . Years of education: N/A   Social History Main Topics  . Smoking status: Never Smoker  . Smokeless tobacco: Never Used  . Alcohol use No  . Drug use: No  . Sexual activity: Yes     Birth control/ protection: Other-see comments     Comment: pregnant   Other Topics Concern  . None   Social History Narrative  . None    Allergies  Allergen Reactions  . Tamiflu Rash    No current facility-administered medications on file prior to encounter.    Current Outpatient Prescriptions on File Prior to Encounter  Medication Sig Dispense Refill  . butalbital-acetaminophen-caffeine (FIORICET, ESGIC) 50-325-40 MG tablet Take 1-2 tablets by mouth every 6 (six) hours as needed for headache. 20 tablet 0  . Prenatal Vit-Fe Fumarate-FA (PRENATAL COMPLETE) 14-0.4 MG TABS Take 1 tablet by mouth every morning. (Patient not taking: Reported on 08/04/2016) 60 each 2  Physical Exam   Vitals:   08/04/16 0946 08/04/16 0951  BP:  116/77  Pulse:  97  Resp:  16  Temp:  97.6 F (36.4 C)  TempSrc:  Oral  Weight: 135 lb (61.2 kg)   Height: 4\' 11"  (1.499 m)     Physical Exam  Nursing note and vitals reviewed. Constitutional: She is oriented to person, place, and time. She appears well-developed and well-nourished. No distress.  HENT:  Head: Normocephalic and atraumatic.  Eyes: Conjunctivae are normal. Right eye exhibits no discharge. Left eye exhibits no discharge. No scleral icterus.  Neck: Normal range of motion.  Respiratory: Effort normal. No respiratory distress.  GI: Soft. She exhibits no distension. There is tenderness in the left lower quadrant. There is no rigidity, no rebound and no guarding.  Neurological: She is alert and oriented to person, place, and time.  Skin: Skin is warm and dry. She is not diaphoretic.  Psychiatric: She has a normal mood and affect. Her behavior is normal. Judgment and thought content normal.    MAU Course  Procedures Component     Latest Ref Rng & Units 07/30/2016 08/02/2016 08/04/2016         7:04 PM    HCG, Beta Chain, Quant, S     <5 mIU/mL 8,295 (H) 3,360 (H) 3,634 (H)   [place] Ob Transvaginal  Result Date:  08/04/2016 CLINICAL DATA:  Followup ectopic pregnancy. EXAM: OBSTETRIC <14 WK US AND TRANSVAGINAL OB US TECHNIQUE: Both transabdominal and transvaginal ultrasound examinations were performed for complete evaluation of the gestation as well as the maternal uterus, adnexal regions, and pelvic cul-de-sac. Transvaginal technique was performed to assess early pregnancy. COMPARISON:  07/30/2016 FINDINGS: Intrauterine gestational sac: None Yolk sac:  Visualized. Embryo:  Visualized. Cardiac Activity: Not Visualized. Subchorionic hemorrhage:  None visualized. Maternal uterus/adnexae: Right ovary: Corpus luteum noted. Left ovary: Appears normal. Other :Again noted is an echogenic mass in region of left cornua measuring 2.1 x 2.1 x 2.8 cm. This is similar in appearance to the study from 07/30/2016. Free fluid:  Trace free fluid noted within the pelvis. IMPRESSION: 1. Examination is positive for solid-appearing mass in the region of the left cornu of concerning for ectopic pregnancy. This is similar in size and appearance to study from 07/30/2016. Critical Value/emergent results were called by telephone at the time of interpretation on 08/04/2016 at 11:51 am to Dr. Judeth HornERIN [name] , who verbally acknowledged these results. Electronically Signed   By: Signa Kellaylor  Stroud M.D.   On: 08/04/2016 11:51    MDM Ultrasound shows ectopic pregnancy similar in size to previous study. Trace free fluid.  VSS, NAD, hemoglobin stable Dr. [name] in to evaluate patient. Will give second dose of methotrexate today & have pt return in 2 days for bhcg  Assessment and Plan  30 y.o. [id number] at 2474w3d wks Pregnancy 1. Other ectopic pregnancy without intrauterine pregnancy   2. Ectopic pregnancy   3. Abdominal pain affecting pregnancy     P: Discharge home Return Saturday for bhcg Strict return precautions for worsening symptoms or signs of rupture ectopic Rx percocet   Judeth HornErin [name], NP 08/04/2016 10:19 AM

## 2016-08-06 ENCOUNTER — Inpatient Hospital Stay (HOSPITAL_COMMUNITY)
Admission: AD | Admit: 2016-08-06 | Discharge: 2016-08-06 | Disposition: A | Discharge status: Home / Self Care | Payer: Medicaid Other | Source: Ambulatory Visit | Attending: Obstetrics & Gynecology | Admitting: Obstetrics & Gynecology

## 2016-08-06 DIAGNOSIS — O034 Incomplete spontaneous abortion without complication: Secondary | ICD-10-CM | POA: Diagnosis present

## 2016-08-06 DIAGNOSIS — O008 Other ectopic pregnancy without intrauterine pregnancy: Secondary | ICD-10-CM | POA: Insufficient documentation

## 2016-08-06 DIAGNOSIS — O00102 Left tubal pregnancy without intrauterine pregnancy: Secondary | ICD-10-CM

## 2016-08-06 LAB — HCG, QUANTITATIVE, PREGNANCY: HCG, BETA CHAIN, QUANT, S: 3052 m[IU]/mL — AB (ref <5)

## 2016-08-06 NOTE — MAU Provider Note (Signed)
S: In for repeat quant. C/o some spotting but no cramping or heavy bleeding.  O: VSS,  Alert and oriented x 3. A:ectopic preg with falling quants P: will send message to clinic to get pt in for repeat quant in clinic Friday. . Discussed lab findings with her and need to repeat quant on friday wks. Reviewed S and S of ectopic . D/c pt home to follow up in clinic or PRN. 1653: talked with [place] on phone to make sure she knows to follow up in clinic Friday for a repeat quant. She verbalized understanding and states she would follow up Friday in clinic for repeat.

## 2016-08-06 NOTE — MAU Note (Addendum)
Patient presents for repeat HCG, still having pain took 2 Percocet this morning 0/10, heavy vaginal bleeding passing clots and changing a pad every 2 hours. Patient needs a note for work to return on Monday with no restrictions.

## 2016-08-06 NOTE — Discharge Instructions (Signed)

## 2016-08-10 ENCOUNTER — Ambulatory Visit: Payer: Medicaid Other | Admitting: Obstetrics and Gynecology

## 2016-08-12 ENCOUNTER — Inpatient Hospital Stay (HOSPITAL_COMMUNITY)
Admission: AD | Admit: 2016-08-12 | Discharge: 2016-08-12 | Discharge status: Against Medical Advice | Payer: Medicaid Other | Source: Ambulatory Visit | Attending: Obstetrics & Gynecology | Admitting: Obstetrics & Gynecology

## 2016-08-12 DIAGNOSIS — O008 Other ectopic pregnancy without intrauterine pregnancy: Secondary | ICD-10-CM

## 2016-08-12 DIAGNOSIS — Z3A09 9 weeks gestation of pregnancy: Secondary | ICD-10-CM | POA: Insufficient documentation

## 2016-08-12 DIAGNOSIS — O26851 Spotting complicating pregnancy, first trimester: Secondary | ICD-10-CM | POA: Insufficient documentation

## 2016-08-12 LAB — HCG, QUANTITATIVE, PREGNANCY: hCG, Beta Chain, Quant, S: 1800 m[IU]/mL — ABNORMAL HIGH

## 2016-08-12 NOTE — Discharge Instructions (Signed)
Methotrexate Treatment for an Ectopic Pregnancy, Care After °Refer to this sheet in the next few weeks. These instructions provide you with information on caring for yourself after your procedure. Your health care provider may also give you more specific instructions. Your treatment has been planned according to current medical practices, but problems sometimes occur. Call your health care provider if you have any problems or questions after your procedure. °WHAT TO EXPECT AFTER THE PROCEDURE °You may have some abdominal cramping, vaginal bleeding, and fatigue in the first few days after taking methotrexate. Some other possible side effects of methotrexate include: °· Nausea. °· Vomiting. °· Diarrhea. °· Mouth sores. °· Swelling or irritation of the lining of your lungs (pneumonitis). °· Liver damage. °· Hair loss. °HOME CARE INSTRUCTIONS  °After you have received the methotrexate medicine, you need to be careful of your activities and watch your condition for several weeks. It may take 1 week before your hormone levels return to normal. °· Keep all follow-up appointments as directed by your health care provider. °· Avoid traveling too far away from your health care provider. °· Do not have sexual intercourse until your health care provider says it is safe to do so. °· You may resume your usual diet. °· Limit strenuous activity. °· Do not take folic acid, prenatal vitamins, or other vitamins that contain folic acid. °· Do not take aspirin, ibuprofen, or naproxen (nonsteroidal anti-inflammatory drugs [NSAIDs]). °· Do not drink alcohol. °SEEK MEDICAL CARE IF:  °· You cannot control your nausea and vomiting. °· You cannot control your diarrhea. °· You have sores in your mouth and want treatment. °· You need pain medicine for your abdominal pain. °· You have a rash. °· You are having a reaction to the medicine. °SEEK IMMEDIATE MEDICAL CARE IF:  °· You have increasing abdominal or pelvic pain. °· You notice increased  bleeding. °· You feel light-headed, or you faint. °· You have shortness of breath. °· Your heart rate increases. °· You have a cough. °· You have chills. °· You have a fever. °This information is not intended to replace advice given to you by your health care provider. Make sure you discuss any questions you have with your health care provider. °Document Released: 08/25/2011 Document Revised: 09/10/2013 Document Reviewed: 06/24/2013 °Elsevier Interactive Patient Education © 2017 [employer] ° °

## 2016-08-12 NOTE — MAU Provider Note (Signed)
[name] [name]  is a 30 y.o. [id number] at 6478w4d who presents [facility] today for follow-up quant hCG after Methotrexate (13 days).  Methotrexate was repeated on 08/04/16  (today is Day 7-8)  Denies pain and has light bleeding.  The patient was seen in MAU on 07/30/16   quant hCG of 9996 US showed Left Cornual Ectopic    She denies pain Endorses light vaginal bleeding  Denies fever    She was given Methotrexate on 07/30/16 per recommendation of Dr Jolayne Pantheronstant. HCG 08/04/16    3634    Methotrexate repeated          08/06/16    3052            OB History  Gravida Para Term Preterm AB Living  13 4 4  0 8 4  SAB TAB Ectopic Multiple Live Births  1 6 1  0 4    # Outcome Date GA Lbr Len/2nd Weight Sex Delivery Anes PTL Lv  13 Current           12 Term 05/09/12 2512w5d / 10:58 9 lb 13.3 oz (4.46 kg) M CS-LVertical EPI  LIV     Birth Comments: WNL   11 TAB 2010          10 TAB 2009          9 TAB 2009          8 TAB 2008          7 Term 2007 1379w0d 03:00 6 lb 7 oz (2.92 kg) F Vag-Spont EPI  LIV  6 Term 2005 2879w0d 06:00 5 lb 5 oz (2.41 kg) M Vag-Spont EPI  LIV  5 Term 2003 6079w0d 23:00 8 lb (3.629 kg) F Vag-Spont EPI  LIV  4 SAB 2002          3 TAB           2 TAB           1 Ectopic             Obstetric Comments  SAB x 1. Ectopic x 1. TAB x 6.     Past Medical History:  Diagnosis Date  . Gonorrhea      BP 114/68 (BP Location: Left Arm)   Pulse 95   Temp 98.3 F (36.8 C) (Oral)   Resp 16   LMP 06/06/2016 (Exact Date)   CONSTITUTIONAL: Well-developed, well-nourished female in no acute distress.  MUSCULOSKELETAL: Normal range of motion.  CARDIOVASCULAR: Regular heart rate RESPIRATORY: Normal effort NEUROLOGICAL: Alert and oriented to person, place, and time.  SKIN: Skin is warm and dry. No rash noted. Not diaphoretic. No erythema. No pallor. PSYCH: Normal mood and affect. Normal behavior. Normal judgment and thought content.  Results for orders placed or  performed during the hospital encounter of 08/12/16 (from the past 24 hour(s))  hCG, quantitative, pregnancy     Status: Abnormal   Collection Time: 08/12/16  2:26 PM  Result Value Ref Range   hCG, Beta Chain, Quant, S 1,800 (H) <5 mIU/mL    A: Appropriate fall in quant hCG   P: Discharge home ectopic precautions discussed Patient will return for follow-up HCG in 1 week.   Patient may return to MAU as needed or if her condition were to change or worsen   Aviva SignsMarie L Wyman Meschke, CNM 08/12/2016 4:08 PM

## 2016-08-12 NOTE — MAU Note (Signed)
Pt here for repeat labwork.  Pt denies pain, having light bleeding.

## 2016-08-19 ENCOUNTER — Ambulatory Visit: Payer: Medicaid Other

## 2016-08-19 ENCOUNTER — Telehealth: Payer: Self-pay | Admitting: General Practice

## 2016-08-19 NOTE — Telephone Encounter (Signed)
Patient no showed for repeat bhcg to follow levels post ectopic MTX treatment. Called patient, no answer- left message stating we are trying to reach you regarding a missed lab appt, please call us back to reschedule this appt. Will send mychart message

## 2016-08-21 ENCOUNTER — Inpatient Hospital Stay (HOSPITAL_COMMUNITY)
Admission: AD | Admit: 2016-08-21 | Discharge: 2016-08-21 | Disposition: A | Discharge status: Home / Self Care | Payer: Medicaid Other | Source: Ambulatory Visit | Attending: Obstetrics & Gynecology | Admitting: Obstetrics & Gynecology

## 2016-08-21 ENCOUNTER — Telehealth: Payer: Self-pay | Admitting: Advanced Practice Midwife

## 2016-08-21 DIAGNOSIS — O008 Other ectopic pregnancy without intrauterine pregnancy: Secondary | ICD-10-CM | POA: Insufficient documentation

## 2016-08-21 LAB — HCG, QUANTITATIVE, PREGNANCY: HCG, BETA CHAIN, QUANT, S: 893 m[IU]/mL — AB (ref <5)

## 2016-08-21 NOTE — MAU Note (Signed)
Pt here for repeat labwork, denies pain or bleeding.

## 2016-08-21 NOTE — Discharge Instructions (Signed)
Methotrexate Treatment for an Ectopic Pregnancy Methotrexate is a medicine that treats ectopic pregnancy by stopping the growth of the fertilized egg. It also helps your body absorb tissue from the egg. This takes between 2 weeks and 6 weeks. Most ectopic pregnancies can be successfully treated with methotrexate if they are detected early enough. LET [facility] KNOW ABOUT:  Any allergies you have.  All medicines you are taking, including vitamins, herbs, eye drops, creams, and over-the-counter medicines.  Medical conditions you have. RISKS AND COMPLICATIONS Generally, this is a safe treatment. However, as with any treatment, problems can occur. Possible problems or side effects include:  Nausea.  Vomiting.  Diarrhea.  Abdominal cramping.  Mouth sores.  Increased vaginal bleeding or spotting.   Swelling or irritation of the lining of your lungs (pneumonitis).  Failed treatment and continuation of the pregnancy.   Liver damage.  Hair loss. There is still a risk of the ectopic pregnancy rupturing while using the methotrexate. BEFORE THE PROCEDURE Before you take the medicine:   Liver tests, kidney tests, and a complete blood test are performed.  Blood tests are performed to measure the pregnancy hormone levels and to determine your blood type.  If you are Rh-negative and the father is Rh-positive or his Rh type is not known, you will be given a Rho (D) immune globulin shot. PROCEDURE  There are two methods that your health care provider may use to prescribe methotrexate. One method involves a single dose or injection of the medicine. Another method involves a series of doses given through several injections.  AFTER THE PROCEDURE  You may have some abdominal cramping, vaginal bleeding, and fatigue in the  Ectopic Pregnancy An ectopic pregnancy is when the fertilized egg attaches (implants) outside the uterus. Most ectopic pregnancies occur in one of the  tubes where eggs travel from the ovary to the uterus (fallopian tubes), but the implanting can occur in other locations. In rare cases, ectopic pregnancies occur on the ovary, intestine, pelvis, abdomen, or cervix. In an ectopic pregnancy, the fertilized egg does not have the ability to develop into a normal, healthy baby. A ruptured ectopic pregnancy is one in which tearing or bursting of a fallopian tube causes internal bleeding. Often, there is intense lower abdominal pain, and vaginal bleeding sometimes occurs. Having an ectopic pregnancy can be life-threatening. If this dangerous condition is not treated, it can lead to blood loss, shock, or even death. What are the causes? The most common cause of this condition is damage to one of the fallopian tubes. A fallopian tube may be narrowed or blocked, and that keeps the fertilized egg from reaching the uterus. What increases the risk? This condition is more likely to develop in women of childbearing age who have different levels of risk. The levels of risk can be divided into three categories. High risk You have gone through infertility treatment. You have had an ectopic pregnancy before. You have had surgery on the fallopian tubes, or another surgical procedure, such as an abortion. You have had surgery to have the fallopian tubes tied (tubal ligation). You have problems or diseases of the fallopian tubes. You have been exposed to diethylstilbestrol (DES). This medicine was used until 1971, and it had effects on babies whose mothers took the medicine. You become pregnant while using an IUD (intrauterine device) for birth control. Moderate risk You have a history of infertility. You have had an STI (sexually transmitted infection). You have a history of pelvic  inflammatory disease (PID). You have scarring from endometriosis. You have multiple sexual partners. You smoke. Low risk You have had pelvic surgery. You use vaginal douches. You  became sexually active before age 89. What are the signs or symptoms? Common symptoms of this condition include normal pregnancy symptoms, such as missing a period, nausea, tiredness, abdominal pain, breast tenderness, and bleeding. However, ectopic pregnancy will have additional symptoms, such as: Pain with intercourse. Irregular vaginal bleeding or spotting. Cramping or pain on one side or in the lower abdomen. Fast heartbeat, low blood pressure, and sweating. Passing out while having a bowel movement. Symptoms of a ruptured ectopic pregnancy and internal bleeding may include: Sudden, severe pain in the abdomen and pelvis. Dizziness, weakness, light-headedness, or fainting. Pain in the shoulder or neck area. How is this diagnosed? This condition is diagnosed by: A pelvic exam to locate pain or a mass in the abdomen. A pregnancy test. This blood test checks for the presence as well as the specific level of pregnancy hormone in the bloodstream. Ultrasound. This is performed if a pregnancy test is positive. In this test, a probe is inserted into the vagina. The probe will detect a fetus, possibly in a location other than the uterus. Taking a sample of uterus tissue (dilation and curettage, or D&C). Surgery to perform a visual exam of the inside of the abdomen using a thin, lighted tube that has a tiny camera on the end (laparoscope). Culdocentesis. This procedure involves inserting a needle at the top of the vagina, behind the uterus. If blood is present in this area, it may indicate that a fallopian tube is torn. How is this treated? This condition is treated with medicine or surgery. Medicine An injection of a medicine (methotrexate) may be given to cause the pregnancy tissue to be absorbed. This medicine may save your fallopian tube. It may be given if: The diagnosis is made early, with no signs of active bleeding. The fallopian tube has not ruptured. You are considered to be a good  candidate for the medicine. Usually, pregnancy hormone blood levels are checked after methotrexate treatment. This is to be sure that the medicine is effective. It may take 4-6 weeks for the pregnancy to be absorbed. Most pregnancies will be absorbed by 3 weeks. Surgery A laparoscope may be used to remove the pregnancy tissue. If severe internal bleeding occurs, a larger cut (incision) may be made in the lower abdomen (laparotomy) to remove the fetus and placenta. This is done to stop the bleeding. Part or all of the fallopian tube may be removed (salpingectomy) along with the fetus and placenta. The fallopian tube may also be repaired during the surgery. In very rare circumstances, removal of the uterus (hysterectomy) may be required. After surgery, pregnancy hormone testing may be done to be sure that there is no pregnancy tissue left. Whether your treatment is medicine or surgery, you may receive a Rho (D) immune globulin shot to prevent problems with any future pregnancy. This shot may be given if: You are Rh-negative and the baby's father is Rh-positive. You are Rh-negative and you do not know the Rh type of the baby's father. Follow these instructions at home: Rest and limit your activity after the procedure for as long as told by your health care provider. Until your health care provider says that it is safe: Do not lift anything that is heavier than 10 lb (4.5 kg), or the limit that your health care provider tells you. Avoid physical exercise  and any movement that requires effort (is strenuous). To help prevent constipation: Eat a healthy diet that includes fruits, vegetables, and whole grains. Drink 6-8 glasses of water per day. Get help right away if: You develop worsening pain that is not relieved by medicine. You have: A fever or chills. Vaginal bleeding. Redness and swelling at the incision site. Nausea and vomiting. You feel dizzy or weak. You feel light-headed or you  faint. This information is not intended to replace advice given to you by your health care provider. Make sure you discuss any questions you have with your health care provider. Document Released: 10/13/2004 Document Revised: 05/04/2016 Document Reviewed: 04/06/2016 Elsevier Interactive Patient Education  2017 [employer]   first few days after taking methotrexate.  Blood tests will be taken for several weeks to check the pregnancy hormone levels. The blood tests are performed until there is no more pregnancy hormone detected in the blood. This information is not intended to replace advice given to you by your health care provider. Make sure you discuss any questions you have with your health care provider. Document Released: 08/30/2001 Document Revised: 09/26/2014 Document Reviewed: 06/24/2013 Elsevier Interactive Patient Education  2017 [employer]

## 2016-08-21 NOTE — Telephone Encounter (Signed)
Patient left maternity admissions before receiving results of quantitative hCG. Called to notify her of results and plan for follow-up Quant on Friday, December 8. No answer. Left voicemail requesting the patient call maternity admissions.

## 2016-08-21 NOTE — MAU Provider Note (Signed)
History   None     Chief Complaint:  Follow-up   [name] [name] is  30 y.o. [id number] Patient's last menstrual period was 06/06/2016 (exact date).. Patient is here for follow up of quantitative HCG and ongoing surveillance of pregnancy status.   She is 17 days status post methotrexate No. 2 for ectopic pregnancy here for follow-up hCG. Since her last visit, the patient is without new complaint.     ROS Abdomin Pain: None Vaginal bleeding: none now.   Passage of clots or tissue: None Dizziness: None  Her previous Quantitative HCG values are:  Results for [name] DoingLLEN, Rhegan D (MRN [id number]) as of 08/21/2016 12:01  Ref. Range 07/30/2016 17:57 07/30/2016 19:04 08/02/2016 17:56 08/04/2016 10:19 08/06/2016 12:44 08/12/2016 14:26 08/21/2016 11:07  HCG, Beta Chain, Quant, S Latest Ref Range: <5 mIU/mL 9,996 (H) 8,295 (H) 3,360 (H) 3,634 (H) 3,052 (H) 1,800 (H) 893 (H)     Physical Exam   BP 125/75 (BP Location: Right Arm)   Pulse 103   Temp 98.5 F (36.9 C) (Oral)   Resp 18   LMP 06/06/2016 (Exact Date)  Constitutional: Well-nourished female in no apparent distress. No pallor Neuro: Alert and oriented 4 Cardiovascular: Mild tachycardia Respiratory: Normal effort and rate Abdomen: Soft, nontender Gynecological Exam: examination not indicated  Labs: Results for orders placed or performed during the hospital encounter of 08/21/16 (from the past 24 hour(s))  hCG, quantitative, pregnancy   Collection Time: 08/21/16 11:07 AM  Result Value Ref Range   hCG, Beta Chain, Quant, S 893 (H) <5 mIU/mL    Ultrasound Studies:   [place] Ob Comp Less 14 Wks  Result Date: 07/30/2016 CLINICAL DATA:  Vaginal pain. Estimated gestational age by last menstrual period equals 7 weeks 5 days. EXAM: OBSTETRIC <14 WK US AND TRANSVAGINAL OB US TECHNIQUE: Both transabdominal and transvaginal ultrasound examinations were performed for complete evaluation of the gestation as well as the maternal uterus, adnexal  regions, and pelvic cul-de-sac. Transvaginal technique was performed to assess early pregnancy. COMPARISON:  None. FINDINGS: Intrauterine gestational sac: Not identified Yolk sac:  Not identify Embryo:  Not addendum Subchorionic hemorrhage:  None visualized. Maternal uterus/adnexae: [email or web address] Corpus luteal cyst the RIGHT ovary.  Normal LEFT ovary. Along the LEFT lateral border of the uterus there is a rounded hyperechoic lesion with minimal peripheral vascularity measuring 2.6 x 1.6 cm. This lesion has a central hypoechoic cavity. Finding is concerning for a cornuate ectopic pregnancy. Patient extremely tender at this location. IMPRESSION: 1. Findings are concerning for cornuate ectopic pregnancy on the LEFT. Patient was extremely tender at this location. 2. No intrauterine gestation identified. 3. Corpus luteal cyst of the RIGHT ovary. 4. No free fluid. Findings conveyed toMARIE WILLIAMS on 07/30/2016  at20:35. Electronically Signed   By: Genevive BiStewart  Edmunds M.D.   On: 07/30/2016 20:41   [place] Ob Transvaginal  Result Date: 08/04/2016 CLINICAL DATA:  Followup ectopic pregnancy. EXAM: OBSTETRIC <14 WK US AND TRANSVAGINAL OB US TECHNIQUE: Both transabdominal and transvaginal ultrasound examinations were performed for complete evaluation of the gestation as well as the maternal uterus, adnexal regions, and pelvic cul-de-sac. Transvaginal technique was performed to assess early pregnancy. COMPARISON:  07/30/2016 FINDINGS: Intrauterine gestational sac: None Yolk sac:  Visualized. Embryo:  Visualized. Cardiac Activity: Not Visualized. Subchorionic hemorrhage:  None visualized. Maternal uterus/adnexae: Right ovary: Corpus luteum noted. Left ovary: Appears normal. Other :Again noted is an echogenic mass in region of left cornua measuring 2.1 x 2.1 x 2.8 cm. This is similar  in appearance to the study from 07/30/2016. Free fluid:  Trace free fluid noted within the pelvis. IMPRESSION: 1. Examination is positive for  solid-appearing mass in the region of the left cornu of concerning for ectopic pregnancy. This is similar in size and appearance to study from 07/30/2016. Critical Value/emergent results were called by telephone at the time of interpretation on 08/04/2016 at 11:51 am to Dr. Judeth HornERIN LAWRENCE , who verbally acknowledged these results. Electronically Signed   By: Signa Kellaylor  Stroud M.D.   On: 08/04/2016 11:51   [place] Ob Transvaginal  Result Date: 07/30/2016 CLINICAL DATA:  Vaginal pain. Estimated gestational age by last menstrual period equals 7 weeks 5 days. EXAM: OBSTETRIC <14 WK US AND TRANSVAGINAL OB US TECHNIQUE: Both transabdominal and transvaginal ultrasound examinations were performed for complete evaluation of the gestation as well as the maternal uterus, adnexal regions, and pelvic cul-de-sac. Transvaginal technique was performed to assess early pregnancy. COMPARISON:  None. FINDINGS: Intrauterine gestational sac: Not identified Yolk sac:  Not identify Embryo:  Not addendum Subchorionic hemorrhage:  None visualized. Maternal uterus/adnexae: [email or web address] Corpus luteal cyst the RIGHT ovary.  Normal LEFT ovary. Along the LEFT lateral border of the uterus there is a rounded hyperechoic lesion with minimal peripheral vascularity measuring 2.6 x 1.6 cm. This lesion has a central hypoechoic cavity. Finding is concerning for a cornuate ectopic pregnancy. Patient extremely tender at this location. IMPRESSION: 1. Findings are concerning for cornuate ectopic pregnancy on the LEFT. Patient was extremely tender at this location. 2. No intrauterine gestation identified. 3. Corpus luteal cyst of the RIGHT ovary. 4. No free fluid. Findings conveyed toMARIE WILLIAMS on 07/30/2016  at20:35. Electronically Signed   By: Genevive BiStewart  Edmunds M.D.   On: 07/30/2016 20:41    MAU course/MDM: Quantitative hCG ordered   Assessment: 1. Cornual pregnancy Tx'd w/ MTX. Adequate drop in quant    Plan: Pt left before result were  back. Called pt--no answer. Left VM to call MAU to discuss results.  Needs weekly quant until <1 Ectopic precautions Follow-up Information    [facility]   Specialty:  Obstetrics and Gynecology Why:  for followup blood work. You will need weekly blood work until your hCG level is less than 1 to verify that methotrexate has completely treated your ectopic pregnancy. Contact information: [address] [place] [id number] [phone]       [facility] Follow up.   Why:  As needed if symptoms worsen Contact information: [address] [id number] mc [place] [id number] [phone]           Medication List    TAKE these medications   butalbital-acetaminophen-caffeine 50-325-40 MG tablet Commonly known as:  FIORICET, ESGIC Take 1-2 tablets by mouth every 6 (six) hours as needed for headache.   oxyCODONE-acetaminophen 5-325 MG tablet Commonly known as:  PERCOCET/ROXICET Take 1-2 tablets by mouth every 6 (six) hours as needed for severe pain.       Dorathy KinsmanVirginia Kasiah Manka, CNM 08/21/2016, 12:11 PM  2/3

## 2016-09-08 ENCOUNTER — Ambulatory Visit: Payer: Medicaid Other | Admitting: Family Medicine

## 2016-09-22 ENCOUNTER — Ambulatory Visit (HOSPITAL_COMMUNITY): Payer: Medicaid Other

## 2016-09-22 ENCOUNTER — Ambulatory Visit (INDEPENDENT_AMBULATORY_CARE_PROVIDER_SITE_OTHER): Payer: Medicaid Other

## 2016-09-22 ENCOUNTER — Ambulatory Visit (HOSPITAL_COMMUNITY)
Admission: EM | Admit: 2016-09-22 | Discharge: 2016-09-22 | Disposition: A | Discharge status: Home / Self Care | Payer: Medicaid Other | Attending: Emergency Medicine | Admitting: Emergency Medicine

## 2016-09-22 ENCOUNTER — Encounter (HOSPITAL_COMMUNITY): Payer: Self-pay | Admitting: Emergency Medicine

## 2016-09-22 DIAGNOSIS — M79644 Pain in right finger(s): Secondary | ICD-10-CM

## 2016-09-22 MED ORDER — MELOXICAM 7.5 MG PO TABS: 7.5000 mg | ORAL_TABLET | Freq: Every day | ORAL | 0 refills | Status: AC

## 2016-09-22 NOTE — ED Triage Notes (Signed)
Pt slipped on ice outside her home about 30 minutes ago.  She fell on her right thumb.  She has some minor swelling in the thumb, no ROM and pain in the base of her thumb.

## 2016-09-22 NOTE — ED Provider Notes (Signed)
CSN: [id number]     Arrival date & time 09/22/16  1418 History   First MD Initiated Contact with Patient 09/22/16 1437     Chief Complaint  Patient presents with  . thumb injury    right   (Consider location/radiation/quality/duration/timing/severity/associated sxs/prior Treatment) 31 year old female presents with pain to the right wrist secondary to fall on ice this morning. Patient landed on outstretched arms. She did not strike her head, had no LOC, does not currently have headache, nausea, or other red flag symptoms. Pain is at the base of her thumb, worse with movement, unable to form strong grip due to pain.      Past Medical History:  Diagnosis Date  . Gonorrhea    Past Surgical History:  Procedure Laterality Date  . CESAREAN SECTION  05/09/2012   Procedure: CESAREAN SECTION;  Surgeon: Antionette CharLisa Jackson-Moore, MD;  Location: [facility];  Service: Gynecology;  Laterality: N/A;  . WISDOM TOOTH EXTRACTION     Family History  Problem Relation Age of Onset  . Anesthesia problems Neg Hx   . Other Neg Hx    Social History  Substance Use Topics  . Smoking status: Never Smoker  . Smokeless tobacco: Never Used  . Alcohol use No   OB History    Gravida Para Term Preterm AB Living   13 4 4  0 8 4   SAB TAB Ectopic Multiple Live Births   1 6 1  0 4      Obstetric Comments   SAB x 1. Ectopic x 1. TAB x 6.      Review of Systems  Constitutional: Negative.   Musculoskeletal: Positive for arthralgias (Pain at the base of the right thumb). Negative for neck pain and neck stiffness.  Skin: Negative.   Neurological: Negative.     Allergies  Tamiflu  Home Medications   Prior to Admission medications   Medication Sig Start Date End Date Taking? Authorizing Provider  butalbital-acetaminophen-caffeine (FIORICET, ESGIC) 50-325-40 MG tablet Take 1-2 tablets by mouth every 6 (six) hours as needed for headache. 08/02/16  Yes Jean RosenthalSusan P Lineberry, NP  meloxicam (MOBIC) 7.5 MG tablet Take 1  tablet (7.5 mg total) by mouth daily. 09/22/16 10/02/16  Dorena BodoLawrence Silvestre [place], NP  oxyCODONE-acetaminophen (PERCOCET/ROXICET) 5-325 MG tablet Take 1-2 tablets by mouth every 6 (six) hours as needed for severe pain.    Historical Provider, MD   Meds Ordered and Administered this Visit  Medications - No data to display  BP 117/75 (BP Location: Left Arm)   Pulse 77   Temp 98.7 F (37.1 C) (Oral)   LMP 06/06/2016 (Exact Date)   SpO2 99%   Breastfeeding? Unknown  No data found.   Physical Exam  Constitutional: She is oriented to person, place, and time. She appears well-developed and well-nourished. No distress.  Musculoskeletal: She exhibits tenderness. She exhibits no edema or deformity.       Right wrist: She exhibits decreased range of motion and tenderness. She exhibits no bony tenderness, no swelling, no crepitus and no deformity.       Arms: Neurological: She is oriented to person, place, and time.  Skin: Skin is warm and dry. Capillary refill takes less than 2 seconds. She is not diaphoretic. No erythema. No pallor.  Psychiatric: She has a normal mood and affect.  Nursing note and vitals reviewed.   Urgent Care Course   Clinical Course     Procedures (including critical care time)  Labs Review Labs Reviewed - No  data to display  Imaging Review Dg Finger Thumb Right  Result Date: 09/22/2016 CLINICAL DATA:  FALL ON ICE EXAM: RIGHT THUMB 2+V COMPARISON:  None. FINDINGS: There is no evidence of fracture or dislocation. There is no evidence of arthropathy or other focal bone abnormality. Soft tissues are unremarkable IMPRESSION: Negative. Electronically Signed   By: Signa Kell M.D.   On: 09/22/2016 15:10     Visual Acuity Review  Right Eye Distance:   Left Eye Distance:   Bilateral Distance:    Right Eye Near:   Left Eye Near:    Bilateral Near:         MDM   1. Thumb pain, right    XR negative for acute findings, patient not experiencing pain over the anatomic  snuff box. Thumb immobilized with thumb spica, patient advised to rest, ice, compress, and elevated the affected injury. Patient counseled on red flag warnings such as pain in the "anatomic snuff" box and to return to clinic should she develop pain in that area. Patient given prescription for mobic for pain relief. Should pain persist follow up with primary care provider or return to clinic.    Dorena Bodo, NP 09/22/16 1537

## 2016-09-22 NOTE — Discharge Instructions (Signed)
You do not have a fracture. Rest the affected hand and wear the thumb spica to reduce movement in the thumb. Keep the extremity elevated, you may use ice 15 minutes at a time for pain. You have been given a medicine for pain. Take as directed. Should pain fail to improve or worsen follow up with your primary care provider or return to clinic.

## 2016-10-03 ENCOUNTER — Encounter: Payer: Self-pay | Admitting: Obstetrics and Gynecology

## 2016-10-03 ENCOUNTER — Other Ambulatory Visit (HOSPITAL_COMMUNITY)
Admission: RE | Admit: 2016-10-03 | Discharge: 2016-10-03 | Disposition: A | Discharge status: Home / Self Care | Payer: Medicaid Other | Source: Ambulatory Visit | Attending: Obstetrics | Admitting: Obstetrics

## 2016-10-03 ENCOUNTER — Ambulatory Visit (INDEPENDENT_AMBULATORY_CARE_PROVIDER_SITE_OTHER): Payer: Medicaid Other | Admitting: Obstetrics and Gynecology

## 2016-10-03 VITALS — BP 116/79 | HR 81 | Temp 98.7°F | Wt 137.8 lb

## 2016-10-03 DIAGNOSIS — Z113 Encounter for screening for infections with a predominantly sexual mode of transmission: Secondary | ICD-10-CM | POA: Diagnosis not present

## 2016-10-03 DIAGNOSIS — O008 Other ectopic pregnancy without intrauterine pregnancy: Secondary | ICD-10-CM | POA: Diagnosis not present

## 2016-10-03 MED ORDER — NORELGESTROMIN-ETH ESTRADIOL 150-35 MCG/24HR TD PTWK: 1.0000 | MEDICATED_PATCH | TRANSDERMAL | 12 refills | Status: DC

## 2016-10-03 NOTE — Progress Notes (Signed)
31 yo presenting for follow up on cornual ectopic pregnancy treated with methotrexate. Patient reports feeling well. She denies any pelvic pain. She is not taking any pain medications. She has remained abstinent. She desires to be tested for gonorrhea and chlamydia. This was not a desires pregnancy and she is interested in contraception  Past Medical History:  Diagnosis Date  . Gonorrhea    Past Surgical History:  Procedure Laterality Date  . CESAREAN SECTION  05/09/2012   Procedure: CESAREAN SECTION;  Surgeon: Antionette CharLisa Jackson-Moore, MD;  Location: [facility];  Service: Gynecology;  Laterality: N/A;  . WISDOM TOOTH EXTRACTION     Family History  Problem Relation Age of Onset  . Anesthesia problems Neg Hx   . Other Neg Hx    Social History  Substance Use Topics  . Smoking status: Never Smoker  . Smokeless tobacco: Never Used  . Alcohol use No   ROS See pertinent in HPI  Blood pressure 116/79, pulse 81, temperature 98.7 F (37.1 C), weight 137 lb 12.8 oz (62.5 kg), last menstrual period 06/06/2016, not currently breastfeeding. GENERAL: Well-developed, well-nourished female in no acute distress.  ABDOMEN: Soft, nontender, nondistended. No organomegaly. EXTREMITIES: No cyanosis, clubbing, or edema, 2+ distal pulses.  A/P 31 yo with cornual ectopic pregnancy treated with MTX here for follow up - quant HCG today. Patient will be contacted with results and follow up plan - GC/Cl ordered - Contraception options reviewed with the patient and she opted for OrthoEvra. Patient has previously used [facility] without complications

## 2016-10-04 LAB — GC/CHLAMYDIA PROBE AMP (~~LOC~~) NOT AT ARMC
Chlamydia: NEGATIVE
NEISSERIA GONORRHEA: NEGATIVE

## 2016-10-04 LAB — BETA HCG QUANT (REF LAB): hCG Quant: <1 m[IU]/mL

## 2016-10-05 LAB — URINE CULTURE: Organism ID, Bacteria: NO GROWTH

## 2016-10-10 ENCOUNTER — Telehealth: Payer: Self-pay | Admitting: *Deleted

## 2016-10-10 NOTE — Telephone Encounter (Signed)
Pt called to office for lab results.  Return call to pt. Pt made aware of results and that she may start her birth control. Pt advised of all other lab results as well.

## 2016-10-14 ENCOUNTER — Telehealth: Payer: Self-pay | Admitting: *Deleted

## 2016-10-14 NOTE — Telephone Encounter (Signed)
-----   Message from Catalina AntiguaPeggy Constant, MD sent at 10/04/2016  8:10 AM EST ----- Please inform patient of complete resolution of pregnancy. She may start her birth control. Please inform patient that it is normal to experience irregular bleeding during the first month of starting a birth control. If irregular bleeding persists after 3 months, she should come in for evaluation and possible birth control change  Peggy

## 2016-10-14 NOTE — Telephone Encounter (Signed)
Patient is aware of her test results- reviewed her birth control instructions.

## 2017-01-09 ENCOUNTER — Encounter: Payer: Self-pay | Admitting: *Deleted

## 2017-01-10 ENCOUNTER — Ambulatory Visit (HOSPITAL_COMMUNITY)
Admission: EM | Admit: 2017-01-10 | Discharge: 2017-01-10 | Disposition: A | Discharge status: Other Acute Inpatient Hospital | Payer: Medicaid Other

## 2017-01-10 ENCOUNTER — Encounter (HOSPITAL_COMMUNITY): Payer: Self-pay | Admitting: *Deleted

## 2017-01-10 ENCOUNTER — Emergency Department (HOSPITAL_COMMUNITY)
Admission: EM | Admit: 2017-01-10 | Discharge: 2017-01-10 | Disposition: A | Discharge status: Home / Self Care | Payer: Medicaid Other | Attending: Emergency Medicine | Admitting: Emergency Medicine

## 2017-01-10 ENCOUNTER — Emergency Department (HOSPITAL_COMMUNITY): Payer: Medicaid Other

## 2017-01-10 DIAGNOSIS — Y990 Civilian activity done for income or pay: Secondary | ICD-10-CM | POA: Insufficient documentation

## 2017-01-10 DIAGNOSIS — X58XXXA Exposure to other specified factors, initial encounter: Secondary | ICD-10-CM | POA: Insufficient documentation

## 2017-01-10 DIAGNOSIS — S29012A Strain of muscle and tendon of back wall of thorax, initial encounter: Secondary | ICD-10-CM | POA: Diagnosis not present

## 2017-01-10 DIAGNOSIS — Y9389 Activity, other specified: Secondary | ICD-10-CM | POA: Insufficient documentation

## 2017-01-10 DIAGNOSIS — Y9289 Other specified places as the place of occurrence of the external cause: Secondary | ICD-10-CM | POA: Insufficient documentation

## 2017-01-10 DIAGNOSIS — S3992XA Unspecified injury of lower back, initial encounter: Secondary | ICD-10-CM | POA: Diagnosis present

## 2017-01-10 MED ORDER — CYCLOBENZAPRINE HCL 10 MG PO TABS: 10.0000 mg | ORAL_TABLET | Freq: Two times a day (BID) | ORAL | 0 refills | Status: DC | PRN

## 2017-01-10 MED ORDER — NAPROXEN 500 MG PO TABS: 500.0000 mg | ORAL_TABLET | Freq: Two times a day (BID) | ORAL | 0 refills | Status: DC

## 2017-01-10 NOTE — ED Provider Notes (Signed)
[facility]-EMERGENCY DEPT Provider Note   CSN: [id number] Arrival date & time: 01/10/17  1115  By signing my name below, I, Marnette Burgess Long, attest that this documentation has been prepared under the direction and in the presence of Rolan Bucco, MD. Electronically Signed: Christian Mate, Scribe. 01/10/2017. 1:08 PM.  History   Chief Complaint Chief Complaint  Patient presents with  . Shoulder Pain   The history is provided by the patient and medical records. No language interpreter was used.    HPI Comments:  [name] [name] is a 31 y.o. female with a PMHx of Gonorrhea, who presents to the Emergency Department complaining of constant, generalized, right-sided pain onset this morning. She states the pain arose spontaneously while at work [employer]. No recent injury, exertional component, or trauma stated. She states the pain radiates from her shoulder and into her associated right upper back and her lower back. She states moving the affected shoulder exacerbates her pain. She tried Tylenol at home with no relief of the pain. She denies pain on deep inspiration. Pt also denies numbness, weakness, SOB, wheezing, CP, cough, and any other complaints at this time. Pt does not currently have a PCP.  No pleuritic pain.   Past Medical History:  Diagnosis Date  . Gonorrhea    Patient Active Problem List   Diagnosis Date Noted  . Abortion, incomplete 08/06/2016  . left cornual 08/02/2016  . Vaginitis and vulvovaginitis, unspecified 03/19/2013   Past Surgical History:  Procedure Laterality Date  . CESAREAN SECTION  05/09/2012   Procedure: CESAREAN SECTION;  Surgeon: Antionette Char, MD;  Location: [facility];  Service: Gynecology;  Laterality: N/A;  . WISDOM TOOTH EXTRACTION     OB History    Gravida Para Term Preterm AB Living   0 8 4   SAB TAB Ectopic Multiple Live Births   0 4      Obstetric Comments   SAB x 1. Ectopic x 1. TAB x 6.      Home Medications    Prior to Admission  medications   Medication Sig Start Date End Date Taking? Authorizing Provider  butalbital-acetaminophen-caffeine (FIORICET, ESGIC) 50-325-40 MG tablet Take 1-2 tablets by mouth every 6 (six) hours as needed for headache. Patient not taking: Reported on 10/03/2016 08/02/16   Jean Rosenthal, NP  cyclobenzaprine (FLEXERIL) 10 MG tablet Take 1 tablet (10 mg total) by mouth 2 (two) times daily as needed for muscle spasms. 01/10/17   Rolan Bucco, MD  naproxen (NAPROSYN) 500 MG tablet Take 1 tablet (500 mg total) by mouth 2 (two) times daily. 01/10/17   Rolan Bucco, MD  norelgestromin-ethinyl estradiol (ORTHO EVRA) 150-35 MCG/24HR transdermal patch Place 1 patch onto the skin once a week. 10/03/16   Peggy Constant, MD  oxyCODONE-acetaminophen (PERCOCET/ROXICET) 5-325 MG tablet Take 1-2 tablets by mouth every 6 (six) hours as needed for severe pain.    Historical Provider, MD   Family History Family History  Problem Relation Age of Onset  . Anesthesia problems Neg Hx   . Other Neg Hx    Social History Social History  Substance Use Topics  . Smoking status: Never Smoker  . Smokeless tobacco: Never Used  . Alcohol use No   Allergies   Tamiflu   Review of Systems Review of Systems  Respiratory: Negative for [id number] of breath and wheezing.   Cardiovascular: Negative for chest pain.  Musculoskeletal: Positive for arthralgias, back pain and myalgias.  Neurological: Negative for weakness and numbness.     Physical Exam Updated Vital Signs BP (!) 121/93 (BP Location: Left Arm)   Pulse 76   Temp 98.6 F (37 C) (Oral)   Resp 18   Ht  (1.499 m)   Wt 140 lb (63.5 kg)   LMP 12/14/2016   SpO2 100%   BMI 28.28 kg/m   Physical Exam  Constitutional: She is oriented to person, place, and time. She appears well-developed and well-nourished.  HENT:  Head: Normocephalic and atraumatic.  Neck: Normal range of motion. Neck supple.  Cardiovascular: Normal rate.     Pulmonary/Chest: Effort normal.  Musculoskeletal: She exhibits tenderness.  Tenderness along right upper back and the musculature medial and inferior to the scapula. No pain on palpation or ROM of the shoulder. No pain over the spine, normal motor function and sensation in the upper extremities. Radial pulses intact.   Neurological: She is alert and oriented to person, place, and time.  Skin: Skin is warm and dry.  Psychiatric: She has a normal mood and affect.     ED Treatments / Results  DIAGNOSTIC STUDIES:  Oxygen Saturation is 100% on RA, normal by my interpretation.    COORDINATION OF CARE:  1:00 PM Discussed treatment plan with pt at bedside including right shoulder XR, muscle relaxant, with an anti-inflammatory and pt agreed to plan.  Labs (all labs ordered are listed, but only abnormal results are displayed) Labs Reviewed - No data to display  EKG  EKG Interpretation None       Radiology Dg Shoulder Right  Result Date: 01/10/2017 CLINICAL DATA:  Right shoulder pain.  No known injury. EXAM: RIGHT SHOULDER - 2+ VIEW COMPARISON:  None. FINDINGS: There is no evidence of fracture or dislocation. There is no evidence of arthropathy or other focal bone abnormality. Soft tissues are unremarkable. IMPRESSION: Negative. Electronically Signed   By: Charlett Nose M.D.   On: 01/10/2017 12:12    Procedures Procedures (including critical care time)  Medications Ordered in ED Medications - No data to display   Initial Impression / Assessment and Plan / ED Course  I have reviewed the triage vital signs and the nursing notes.  Pertinent labs & imaging results that were available during my care of the patient were reviewed by me and considered in my medical decision making (see chart for details).     Patient with musculoskeletal upper back pain. It's reproducible on palpation. She has no other symptoms that would be more suggestive of other etiology such as pulmonary embolus  or pneumothorax. There is no rash present. Neuro deficits. She was discharged home in good condition. She was started on Naprosyn and Flexeril. She was given a referral to follow-up with orthopedics if her symptoms are not improving.  Final Clinical Impressions(s) / ED Diagnoses   Final diagnoses:  Upper back strain, initial encounter    New Prescriptions New Prescriptions   CYCLOBENZAPRINE (FLEXERIL) 10 MG TABLET    Take 1 tablet (10 mg total) by mouth 2 (two) times daily as needed for muscle spasms.   NAPROXEN (NAPROSYN) 500 MG TABLET    Take 1 tablet (500 mg total) by mouth 2 (two) times daily.    I personally performed the services described in this documentation, which was scribed in my presence.  The recorded information has been reviewed and considered.     Rolan Bucco, MD 01/10/17 1316

## 2017-01-10 NOTE — ED Triage Notes (Signed)
Pt reports that she was at work when she began having pain with movement of her rt shoulder. Pt denies any injury. Pt denies tenderness with palpation

## 2017-03-06 ENCOUNTER — Encounter (HOSPITAL_COMMUNITY): Payer: Self-pay | Admitting: *Deleted

## 2017-03-06 ENCOUNTER — Ambulatory Visit (HOSPITAL_COMMUNITY)
Admission: EM | Admit: 2017-03-06 | Discharge: 2017-03-06 | Disposition: A | Discharge status: Home / Self Care | Payer: Medicaid Other | Attending: Family Medicine | Admitting: Family Medicine

## 2017-03-06 DIAGNOSIS — R350 Frequency of micturition: Secondary | ICD-10-CM | POA: Diagnosis not present

## 2017-03-06 DIAGNOSIS — R1030 Lower abdominal pain, unspecified: Secondary | ICD-10-CM | POA: Insufficient documentation

## 2017-03-06 DIAGNOSIS — Z3202 Encounter for pregnancy test, result negative: Secondary | ICD-10-CM

## 2017-03-06 DIAGNOSIS — Z8619 Personal history of other infectious and parasitic diseases: Secondary | ICD-10-CM | POA: Diagnosis not present

## 2017-03-06 DIAGNOSIS — R109 Unspecified abdominal pain: Secondary | ICD-10-CM

## 2017-03-06 LAB — POCT URINALYSIS DIP (DEVICE)
Bilirubin Urine: NEGATIVE
GLUCOSE, UA: NEGATIVE mg/dL
HGB URINE DIPSTICK: NEGATIVE
KETONES UR: NEGATIVE mg/dL
LEUKOCYTES UA: NEGATIVE
Nitrite: NEGATIVE
Protein, ur: NEGATIVE mg/dL
SPECIFIC GRAVITY, URINE: 1.025 (ref 1.005–1.030)
UROBILINOGEN UA: 0.2 mg/dL (ref 0.0–1.0)
pH: 5.5 (ref 5.0–8.0)

## 2017-03-06 LAB — POCT PREGNANCY, URINE: Preg Test, Ur: NEGATIVE

## 2017-03-06 MED ORDER — FLUCONAZOLE 150 MG PO TABS
150.0000 mg | ORAL_TABLET | Freq: Every day | ORAL | 0 refills | Status: AC
Start: 2017-03-06 — End: 2017-03-07

## 2017-03-06 MED ORDER — SULFAMETHOXAZOLE-TRIMETHOPRIM 800-160 MG PO TABS: 1.0000 | ORAL_TABLET | Freq: Two times a day (BID) | ORAL | 0 refills | Status: AC

## 2017-03-06 NOTE — ED Triage Notes (Signed)
Frequent     Urination    Low     abd   No  Gyn   Symptoms  Not  Her   Late  On  Period    Symptoms  X  1   Week

## 2017-03-06 NOTE — ED Provider Notes (Signed)
CSN: [id number]     Arrival date & time 03/06/17  4098 History     Chief Complaint  Patient presents with  . Abdominal Cramping    31 yo female with PMH of Gonorrhea, ectopic pregnancy, comes in with 1 week history of urinary frequency/hesitancy and lower abdominal cramping. She states that abdominal cramping is associated with a full bladder and that it resolves once she relieves her bladder. She denies dysuria, foul smelling urine, hematuria. She denies vaginal discharge, vaginal itching/pain, bleeding/spotting. LMP 02/18/2017, currently sexually active with one partner and denies chance of STD. Some nausea without vomiting. Denies diarrhea or constipation, had a normal BM last night. Denies fever, chill.       Past Medical History:  Diagnosis Date  . Gonorrhea    Past Surgical History:  Procedure Laterality Date  . CESAREAN SECTION  05/09/2012   Procedure: CESAREAN SECTION;  Surgeon: Antionette Char, MD;  Location: [facility];  Service: Gynecology;  Laterality: N/A;  . WISDOM TOOTH EXTRACTION     Family History  Problem Relation Age of Onset  . Anesthesia problems Neg Hx   . Other Neg Hx    Social History  Substance Use Topics  . Smoking status: Never Smoker  . Smokeless tobacco: Never Used  . Alcohol use No   OB History    Gravida Para Term Preterm AB Living   13 4 4  0 8 4   SAB TAB Ectopic Multiple Live Births   1 6 1  0 4      Obstetric Comments   SAB x 1. Ectopic x 1. TAB x 6.      Review of Systems  Constitutional: Negative for chills and fever.  Respiratory: Negative for cough, shortness of breath and wheezing.   Cardiovascular: Negative for chest pain and palpitations.  Gastrointestinal: Positive for abdominal pain and nausea. Negative for abdominal distention, anal bleeding, blood in stool, constipation, diarrhea and vomiting.  Genitourinary: Positive for decreased urine volume, difficulty urinating, enuresis and frequency. Negative for dyspareunia, dysuria,  flank pain, genital sores, hematuria, menstrual problem, pelvic pain, urgency, vaginal bleeding, vaginal discharge and vaginal pain.  Neurological: Negative for dizziness, weakness, light-headedness and headaches.    Allergies  Tamiflu  Home Medications   Prior to Admission medications   Medication Sig Start Date End Date Taking? Authorizing Provider  butalbital-acetaminophen-caffeine (FIORICET, ESGIC) 50-325-40 MG tablet Take 1-2 tablets by mouth every 6 (six) hours as needed for headache. Patient not taking: Reported on 10/03/2016 08/02/16   Jean Rosenthal, NP  cyclobenzaprine (FLEXERIL) 10 MG tablet Take 1 tablet (10 mg total) by mouth 2 (two) times daily as needed for muscle spasms. 01/10/17   Rolan Bucco, MD  fluconazole (DIFLUCAN) 150 MG tablet Take 1 tablet (150 mg total) by mouth daily. 03/06/17 03/07/17  Cathie Hoops, Nalda Shackleford V, PA-C  naproxen (NAPROSYN) 500 MG tablet Take 1 tablet (500 mg total) by mouth 2 (two) times daily. 01/10/17   Rolan Bucco, MD  norelgestromin-ethinyl estradiol (ORTHO EVRA) 150-35 MCG/24HR transdermal patch Place 1 patch onto the skin once a week. 10/03/16   Constant, Peggy, MD  oxyCODONE-acetaminophen (PERCOCET/ROXICET) 5-325 MG tablet Take 1-2 tablets by mouth every 6 (six) hours as needed for severe pain.    [provider]  sulfamethoxazole-trimethoprim (BACTRIM DS,SEPTRA DS) 800-160 MG tablet Take 1 tablet by mouth 2 (two) times daily. 03/06/17 03/09/17  Belinda Fisher, PA-C   Meds Ordered and Administered this Visit  Medications - No data to display  BP  109/72 (BP Location: Left Arm)   Pulse 80   Temp 98.5 F (36.9 C) (Oral)   Resp 16   LMP 02/18/2017   SpO2 98%  No data found.   Physical Exam  Constitutional: She is oriented to person, place, and time. She appears well-developed and well-nourished. No distress.  HENT:  Head: Normocephalic and atraumatic.  Eyes: Conjunctivae are normal. Pupils are equal, round, and reactive to light.   Cardiovascular: Normal rate and regular rhythm.  Exam reveals no gallop and no friction rub.   No murmur heard. Pulmonary/Chest: Effort normal and breath sounds normal.  Abdominal: Soft. Bowel sounds are normal. She exhibits no distension and no mass. There is no tenderness. There is no rebound and no guarding.  Patient states since she just voided her bladder for urine sample, she is not experiencing any abdominal cramping right now.   Neurological: She is alert and oriented to person, place, and time.  Skin: Skin is warm and dry.  Psychiatric: She has a normal mood and affect. Her behavior is normal. Judgment normal.    Urgent Care Course   Clinical Course as of Mar 07 1135  Mon Mar 06, 2017  1046 Leukocytes, UA: NEGATIVE [AY]    Clinical Course User Index [AY] Belinda FisherYu, Nayelie Gionfriddo V, PA-C    Procedures (including critical care time)  Labs Review Labs Reviewed  URINE CULTURE  POCT URINALYSIS DIP (DEVICE)  POCT PREGNANCY, URINE  URINE CYTOLOGY ANCILLARY ONLY    Imaging Review No results found.       MDM   1. Urinary frequency   2. Abdominal cramping    1. Discussed urine dipstick result with patient. Urine culture and cytology sent to lab. Discussed with patient will treat as UTI due to symptoms. And patient will be notified if any test results return positive. Start Bactrim DS BID x 3 days. Discussed with patient to avoid caffeine and other substance that can cause urinary irritation.  2. Patient with history of yeast infection with antibiotic use. Start Diflucan 150mg  1 dose if experiencing symptoms.    Belinda FisherYu, Davidson Palmieri V, PA-C 03/06/17 1139

## 2017-03-07 LAB — URINE CYTOLOGY ANCILLARY ONLY
Chlamydia: NEGATIVE
Neisseria Gonorrhea: NEGATIVE
TRICH (WINDOWPATH): NEGATIVE

## 2017-03-07 LAB — URINE CULTURE: Culture: NO GROWTH

## 2017-03-09 LAB — URINE CYTOLOGY ANCILLARY ONLY
BACTERIAL VAGINITIS: NEGATIVE
CANDIDA VAGINITIS: NEGATIVE

## 2017-03-16 ENCOUNTER — Encounter: Payer: Self-pay | Admitting: *Deleted

## 2017-03-16 ENCOUNTER — Encounter: Payer: Self-pay | Admitting: Obstetrics

## 2017-03-16 ENCOUNTER — Other Ambulatory Visit (HOSPITAL_COMMUNITY)
Admission: RE | Admit: 2017-03-16 | Discharge: 2017-03-16 | Disposition: A | Discharge status: Home / Self Care | Payer: Medicaid Other | Source: Ambulatory Visit | Attending: Obstetrics | Admitting: Obstetrics

## 2017-03-16 ENCOUNTER — Ambulatory Visit (INDEPENDENT_AMBULATORY_CARE_PROVIDER_SITE_OTHER): Payer: Medicaid Other | Admitting: Obstetrics

## 2017-03-16 ENCOUNTER — Ambulatory Visit: Payer: Self-pay | Admitting: Obstetrics

## 2017-03-16 VITALS — BP 118/77 | HR 88 | Wt 146.0 lb

## 2017-03-16 DIAGNOSIS — R350 Frequency of micturition: Secondary | ICD-10-CM

## 2017-03-16 DIAGNOSIS — Z0000 Encounter for general adult medical examination without abnormal findings: Secondary | ICD-10-CM | POA: Diagnosis not present

## 2017-03-16 DIAGNOSIS — Z01419 Encounter for gynecological examination (general) (routine) without abnormal findings: Secondary | ICD-10-CM

## 2017-03-16 DIAGNOSIS — R87610 Atypical squamous cells of undetermined significance on cytologic smear of cervix (ASC-US): Secondary | ICD-10-CM | POA: Insufficient documentation

## 2017-03-16 LAB — POCT URINALYSIS DIPSTICK
BILIRUBIN UA: NEGATIVE
Glucose, UA: NEGATIVE
KETONES UA: NEGATIVE
LEUKOCYTES UA: NEGATIVE
Nitrite, UA: NEGATIVE
PH UA: 5.5 (ref 5.0–8.0)
Protein, UA: NEGATIVE
Urobilinogen, UA: NEGATIVE E.U./dL — AB

## 2017-03-16 NOTE — Progress Notes (Signed)
Pregnancy resolved at today's visit. Pt had ectopic pregnancy.

## 2017-03-16 NOTE — Progress Notes (Signed)
Subjective:        KAROLINA ZAMOR is a 31 y.o. female here for a routine exam.  Current complaints: Urinary frequency.    Personal health questionnaire:  Is patient Ashkenazi Jewish, have a family history of breast and/or ovarian cancer: no Is there a family history of uterine cancer diagnosed at age < 37, gastrointestinal cancer, urinary tract cancer, family member who is a Personnel officer syndrome-associated carrier: no Is the patient overweight and hypertensive, family history of diabetes, personal history of gestational diabetes, preeclampsia or PCOS: no Is patient over 60, have PCOS,  family history of premature CHD under age 52, diabetes, smoke, have hypertension or peripheral artery disease:  no At any time, has a partner hit, kicked or otherwise hurt or frightened you?: no Over the past 2 weeks, have you felt down, depressed or hopeless?: no Over the past 2 weeks, have you felt little interest or pleasure in doing things?:no   Gynecologic History Patient's last menstrual period was 02/18/2017. Contraception: none Last Pap: 2017. Results were: normal Last mammogram: n/a. Results were: normal  Obstetric History OB History  Gravida Para Term Preterm AB Living  13 4 4  0 9 4  SAB TAB Ectopic Multiple Live Births  1 6 2  0 4    # Outcome Date GA Lbr Len/2nd Weight Sex Delivery Anes PTL Lv  13 Ectopic 09/19/16     ECTOPIC     12 Term 05/09/12 [redacted]w[redacted]d / 10:58 9 lb 13.3 oz (4.46 kg) M CS-LVertical EPI  LIV     Birth Comments: WNL   11 TAB 2010          10 TAB 2009          9 TAB 2009          8 TAB 2008          7 Term 2007 [redacted]w[redacted]d 03:00 6 lb 7 oz (2.92 kg) F Vag-Spont EPI  LIV  6 Term 2005 [redacted]w[redacted]d 06:00 5 lb 5 oz (2.41 kg) M Vag-Spont EPI  LIV  5 Term 2003 [redacted]w[redacted]d 23:00 8 lb (3.629 kg) F Vag-Spont EPI  LIV  4 SAB 2002          3 TAB           2 TAB           1 Ectopic             Obstetric Comments  SAB x 1. Ectopic x 1. TAB x 6.     Past Medical History:  Diagnosis Date  .  Gonorrhea     Past Surgical History:  Procedure Laterality Date  . CESAREAN SECTION  05/09/2012   Procedure: CESAREAN SECTION;  Surgeon: Antionette Char, MD;  Location: [facility];  Service: Gynecology;  Laterality: N/A;  . WISDOM TOOTH EXTRACTION       Current Outpatient Prescriptions:  .  butalbital-acetaminophen-caffeine (FIORICET, ESGIC) 50-325-40 MG tablet, Take 1-2 tablets by mouth every 6 (six) hours as needed for headache. (Patient not taking: Reported on 10/03/2016), Disp: 20 tablet, Rfl: 0 .  cyclobenzaprine (FLEXERIL) 10 MG tablet, Take 1 tablet (10 mg total) by mouth 2 (two) times daily as needed for muscle spasms. (Patient not taking: Reported on 03/16/2017), Disp: 20 tablet, Rfl: 0 .  naproxen (NAPROSYN) 500 MG tablet, Take 1 tablet (500 mg total) by mouth 2 (two) times daily. (Patient not taking: Reported on 03/16/2017), Disp: 30 tablet, Rfl: 0 .  norelgestromin-ethinyl estradiol (ORTHO EVRA)  150-35 MCG/24HR transdermal patch, Place 1 patch onto the skin once a week., Disp: 3 patch, Rfl: 12 .  oxyCODONE-acetaminophen (PERCOCET/ROXICET) 5-325 MG tablet, Take 1-2 tablets by mouth every 6 (six) hours as needed for severe pain., Disp: , Rfl:  Allergies  Allergen Reactions  . Tamiflu Rash    Social History  Substance Use Topics  . Smoking status: Never Smoker  . Smokeless tobacco: Never Used  . Alcohol use No    Family History  Problem Relation Age of Onset  . Anesthesia problems Neg Hx   . Other Neg Hx       Review of Systems  Constitutional: negative for fatigue and weight loss Respiratory: negative for cough and wheezing Cardiovascular: negative for chest pain, fatigue and palpitations Gastrointestinal: negative for abdominal pain and change in bowel habits Musculoskeletal:negative for myalgias Neurological: negative for gait problems and tremors Behavioral/Psych: negative for abusive relationship, depression Endocrine: negative for temperature intolerance     Genitourinary:negative for abnormal menstrual periods, genital lesions, hot flashes, sexual problems and vaginal discharge.  Positive for nocturia Integument/breast: negative for breast lump, breast tenderness, nipple discharge and skin lesion(s)    Objective:       BP 118/77   Pulse 88   Wt 146 lb (66.2 kg)   LMP 02/18/2017   BMI 29.49 kg/m  General:   alert  Skin:   no rash or abnormalities  Lungs:   clear to auscultation bilaterally  Heart:   regular rate and rhythm, S1, S2 normal, no murmur, click, rub or gallop  Breasts:   normal without suspicious masses, skin or nipple changes or axillary nodes  Abdomen:  normal findings: no organomegaly, soft, non-tender and no hernia  Pelvis:  External genitalia: normal general appearance Urinary system: urethral meatus normal and bladder without fullness, nontender Vaginal: normal without tenderness, induration or masses Cervix: normal appearance Adnexa: normal bimanual exam Uterus: anteverted and non-tender, normal size   Lab Review Urine pregnancy test Labs reviewed yes Radiologic studies reviewed no   50% of 20 min visit spent on counseling and coordination of care.    Assessment:     1. Urinary frequency Rx: - POCT urinalysis dipstick - Urine Culture  2. Encounter for routine gynecological examination with Papanicolaou smear of cervix Rx: - Cytology - PAP - Hemoglobin A1c - Comprehensive metabolic panel - TSH - CBC   Plan:    Education reviewed: calcium supplements, depression evaluation, low fat, low cholesterol diet, safe sex/STD prevention, self breast exams and weight bearing exercise. Contraception: none. Follow up in: 1 year.   No orders of the defined types were placed in this encounter.  Orders Placed This Encounter  Procedures  . POCT urinalysis dipstick     Patient ID: Theodoro DoingCeira D Allen, female   DOB: 1985/12/07, 31 y.o.   MRN: [id number]

## 2017-03-17 LAB — CBC
HEMATOCRIT: 40.5 % (ref 34.0–46.6)
Hemoglobin: 12.9 g/dL (ref 11.1–15.9)
MCH: 30.3 pg (ref 26.6–33.0)
MCHC: 31.9 g/dL (ref 31.5–35.7)
MCV: 95 fL (ref 79–97)
PLATELETS: 272 10*3/uL (ref 150–379)
RBC: 4.26 x10E6/uL (ref 3.77–5.28)
RDW: 12.8 % (ref 12.3–15.4)
WBC: 4 10*3/uL (ref 3.4–10.8)

## 2017-03-17 LAB — COMPREHENSIVE METABOLIC PANEL
A/G RATIO: 1.8 (ref 1.2–2.2)
ALBUMIN: 4.4 g/dL (ref 3.5–5.5)
ALT: 24 IU/L (ref 0–32)
AST: 15 IU/L (ref 0–40)
Alkaline Phosphatase: 69 IU/L (ref 39–117)
BILIRUBIN TOTAL: 0.3 mg/dL (ref 0.0–1.2)
BUN / CREAT RATIO: 15 (ref 9–23)
BUN: 11 mg/dL (ref 6–20)
CHLORIDE: 105 mmol/L (ref 96–106)
CO2: 23 mmol/L (ref 20–29)
Calcium: 9.3 mg/dL (ref 8.7–10.2)
Creatinine, Ser: 0.73 mg/dL (ref 0.57–1.00)
GFR calc Af Amer: 125 mL/min/{1.73_m2} (ref >59)
GFR calc non Af Amer: 109 mL/min/{1.73_m2} (ref >59)
GLOBULIN, TOTAL: 2.5 g/dL (ref 1.5–4.5)
Glucose: 94 mg/dL (ref 65–99)
POTASSIUM: 3.8 mmol/L (ref 3.5–5.2)
Sodium: 141 mmol/L (ref 134–144)
Total Protein: 6.9 g/dL (ref 6.0–8.5)

## 2017-03-17 LAB — HEMOGLOBIN A1C
Est. average glucose Bld gHb Est-mCnc: 94 mg/dL
HEMOGLOBIN A1C: 4.9 % (ref 4.8–5.6)

## 2017-03-17 LAB — TSH: TSH: 0.74 u[IU]/mL (ref 0.450–4.500)

## 2017-03-18 LAB — URINE CULTURE: Organism ID, Bacteria: NO GROWTH

## 2017-03-22 LAB — CYTOLOGY - PAP
DIAGNOSIS: UNDETERMINED — AB
HPV (WINDOPATH): DETECTED — AB

## 2017-03-28 ENCOUNTER — Encounter: Payer: Self-pay | Admitting: *Deleted

## 2017-05-02 IMAGING — US US OB TRANSVAGINAL
1 series · 15 of 28 positions shown · non-contrast
Comparison: 07/30/2016

CLINICAL DATA: Followup ectopic pregnancy.

EXAM:
OBSTETRIC <14 WK US AND TRANSVAGINAL OB US
TECHNIQUE: Both transabdominal and transvaginal ultrasound examinations were
performed for complete evaluation of the gestation as well as the
maternal uterus, adnexal regions, and pelvic cul-de-sac.
Transvaginal technique was performed to assess early pregnancy.

[Series 1: us ob transvaginal · 15 of 44 slices shown]
[im 1/44]
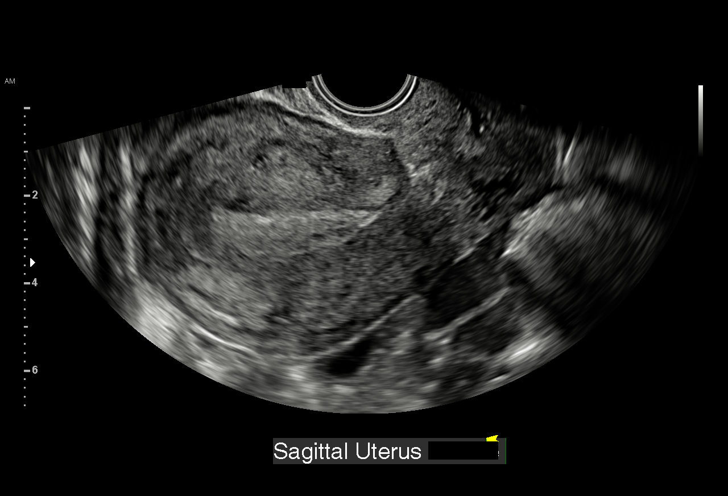
[im 4/44]
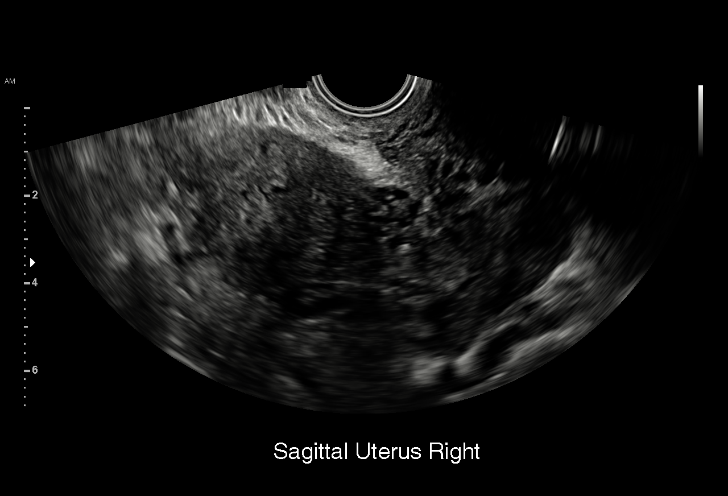
[im 7/44]
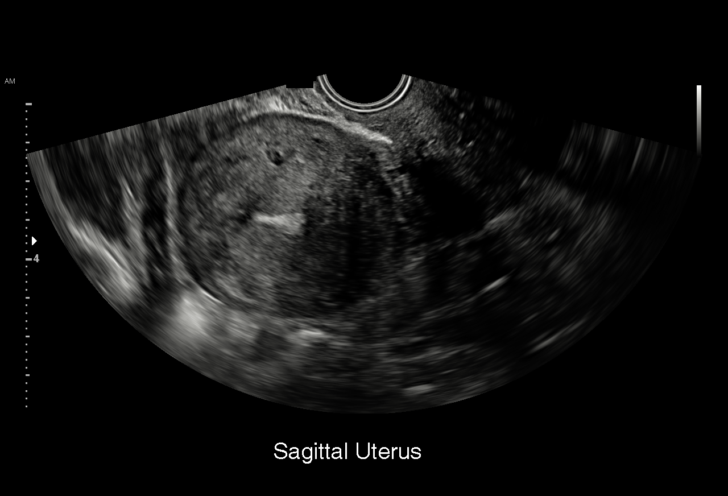
[im 10/44]
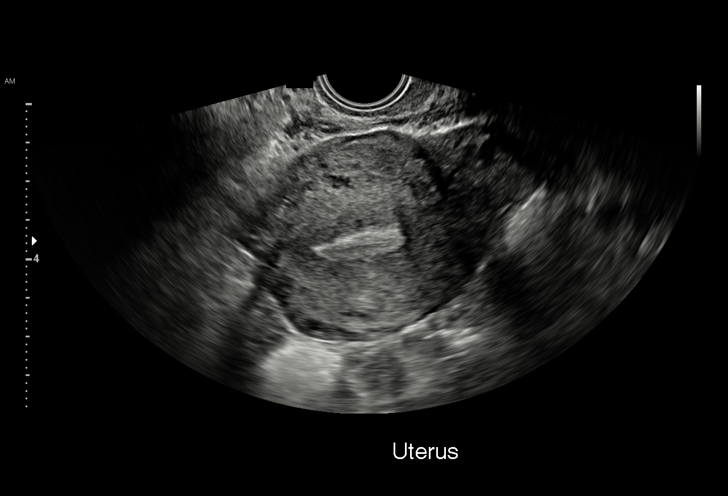
[im 13/44]
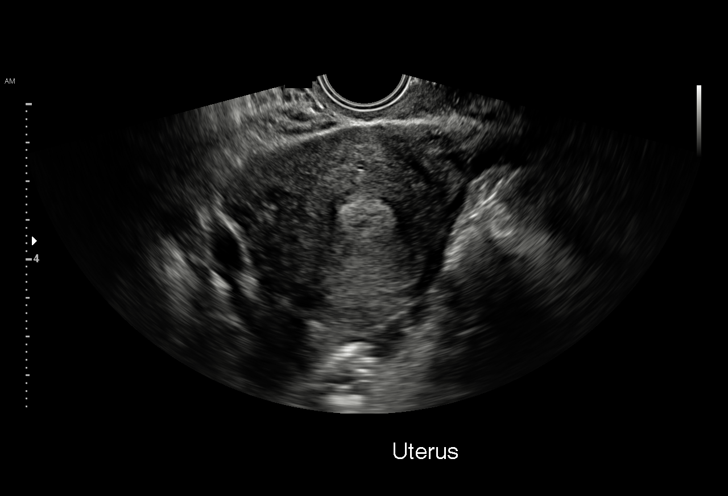
[im 16/44]
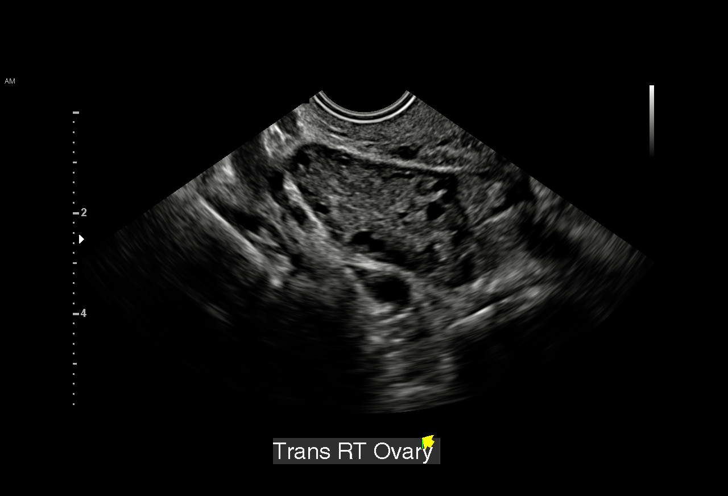
[im 20/44]
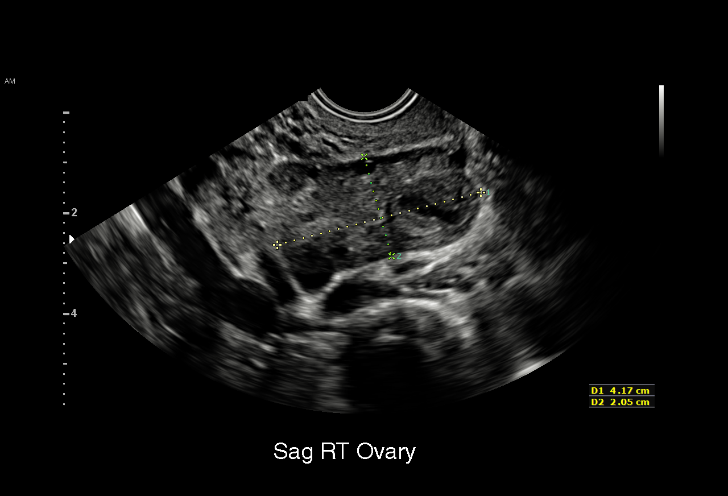
[im 23/44]
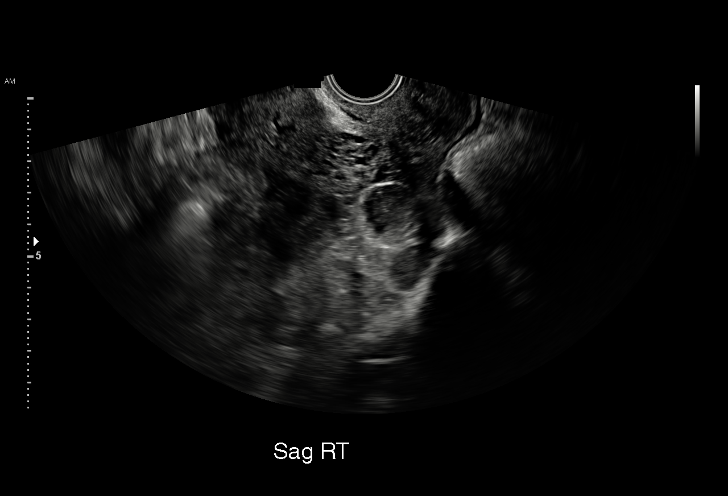
[im 24/44]
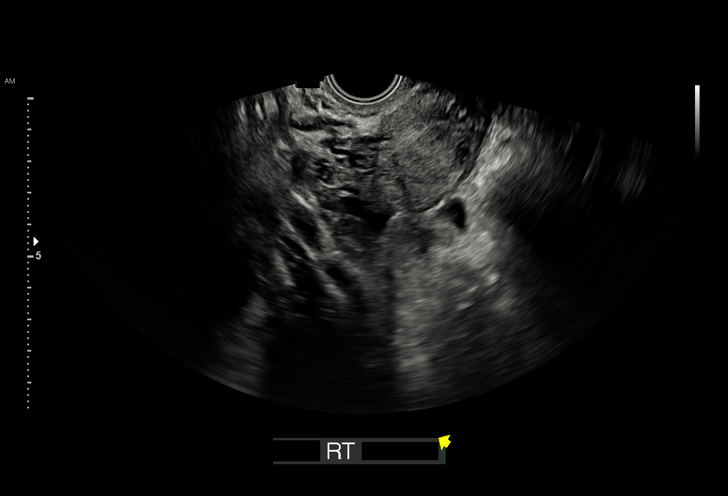
[im 28/44]
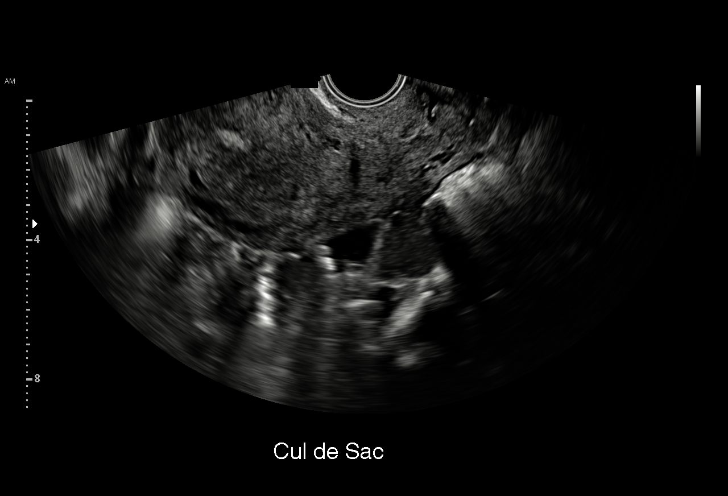
[im 31/44]
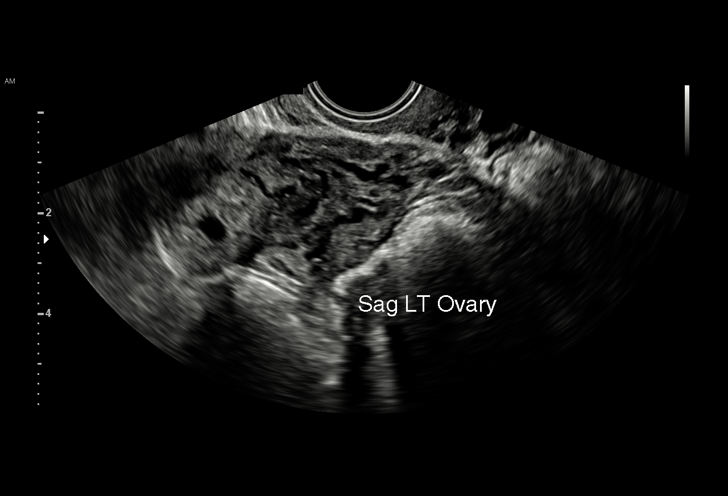
[im 34/44]
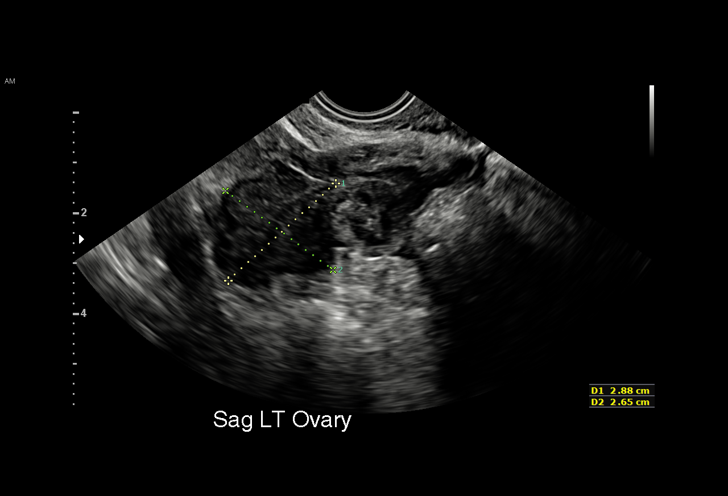
[im 37/44]
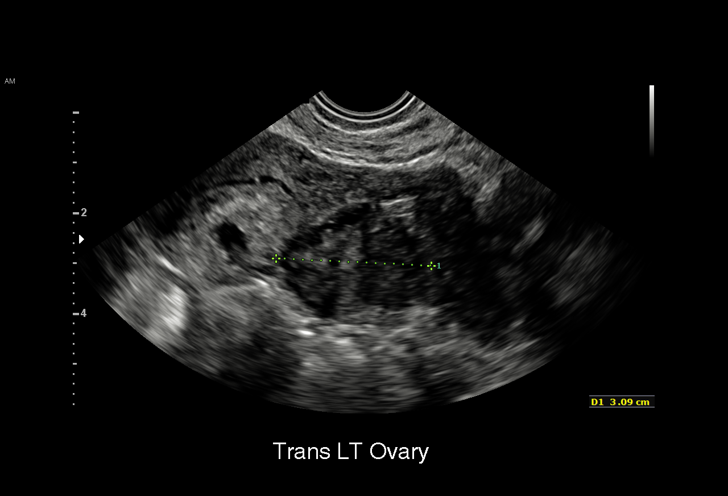
[im 40/44]
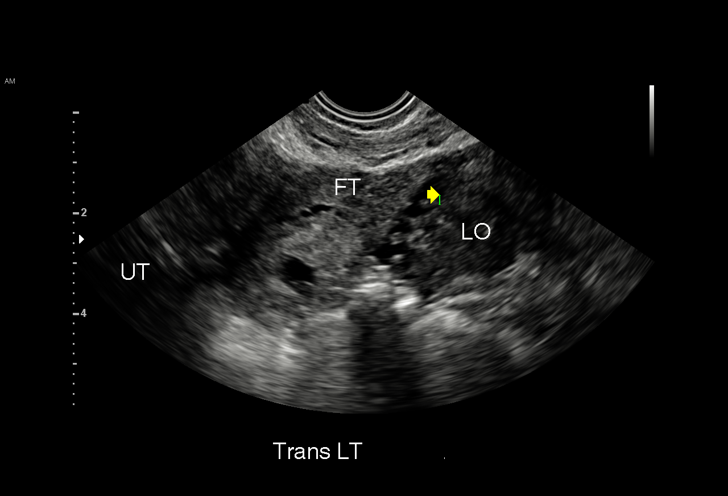
[im 44/44]
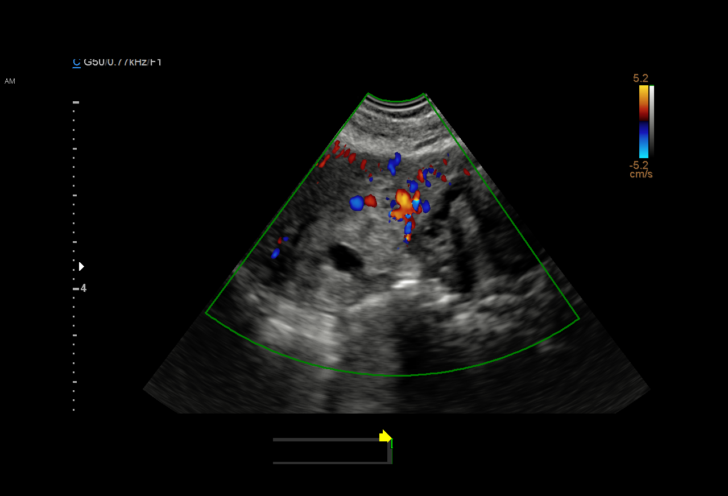

[15 of 28 positions shown; findings below may reference images not displayed]

FINDINGS: Intrauterine gestational sac: None

Yolk sac:  Visualized.

Embryo:  Visualized.

Cardiac Activity: Not Visualized.

Subchorionic hemorrhage:  None visualized.

Maternal uterus/adnexae:

Right ovary: Corpus luteum noted.

Left ovary: Appears normal.

Other :Again noted is an echogenic mass in region of left cornua
measuring 2.1 x 2.1 x 2.8 cm. This is similar in appearance to the
study from 07/30/2016.

Free fluid:  Trace free fluid noted within the pelvis.
IMPRESSION: 1. Examination is positive for solid-appearing mass in the region of
the left cornu of concerning for ectopic pregnancy. This is similar
in size and appearance to study from 07/30/2016.
Critical Value/emergent results were called by telephone at the time
who verbally acknowledged these results.

## 2018-03-12 ENCOUNTER — Ambulatory Visit (HOSPITAL_COMMUNITY)
Admission: EM | Admit: 2018-03-12 | Discharge: 2018-03-12 | Disposition: A | Discharge status: Home / Self Care | Payer: Self-pay | Attending: Family Medicine | Admitting: Family Medicine

## 2018-03-12 ENCOUNTER — Encounter (HOSPITAL_COMMUNITY): Payer: Self-pay | Admitting: Emergency Medicine

## 2018-03-12 DIAGNOSIS — J209 Acute bronchitis, unspecified: Secondary | ICD-10-CM

## 2018-03-12 MED ORDER — BENZONATATE 100 MG PO CAPS: 100.0000 mg | ORAL_CAPSULE | Freq: Three times a day (TID) | ORAL | 0 refills | Status: DC

## 2018-03-12 MED ORDER — AZITHROMYCIN 250 MG PO TABS: ORAL_TABLET | ORAL | 0 refills | Status: AC

## 2018-03-12 MED ORDER — ALBUTEROL SULFATE HFA 108 (90 BASE) MCG/ACT IN AERS: 2.0000 | INHALATION_SPRAY | Freq: Four times a day (QID) | RESPIRATORY_TRACT | 2 refills | Status: DC | PRN

## 2018-03-12 NOTE — Discharge Instructions (Signed)
Push fluids to ensure adequate hydration and keep secretions thin.  Tylenol and/or ibuprofen as needed for pain or fevers.  Complete course of antibiotics.  Use of inhaler as needed for chest tightness or wheezing. Tessalon capsules as needed for coughing. May continue with over the counter treatments as needed for symptoms.  If symptoms worsen or do not improve in the next week to return to be seen or to follow up with your PCP.

## 2018-03-12 NOTE — ED Provider Notes (Signed)
[facility]    CSN: [id number] Arrival date & time: 03/12/18  1522     History   Chief Complaint Chief Complaint  Patient presents with  . URI    HPI [name] [name] is a 32 y.o. female.   [name] [name] presents with complaints of cough and congestion with chest tightness and sore throat which started 6/12. Has not been improving in any way. Some runny nose. Headache, feels improved today. No fevers. Feels shortness of breath  At times, worse at night. Dry cough. Does not smoke, no history of asthma. Denies gi/gu complaints. No known ill contacts. Has been trying multiple OTC treatments including tylenol, sudafed, mucinex, theraful, which have not helped. Without contributing medical history.      ROS per HPI.      Past Medical History:  Diagnosis Date  . Gonorrhea     Patient Active Problem List   Diagnosis Date Noted  . Abortion, incomplete 08/06/2016  . left cornual 08/02/2016  . Vaginitis and vulvovaginitis, unspecified 03/19/2013    Past Surgical History:  Procedure Laterality Date  . CESAREAN SECTION  05/09/2012   Procedure: CESAREAN SECTION;  Surgeon: Antionette CharLisa Jackson-Moore, MD;  Location: [facility];  Service: Gynecology;  Laterality: N/A;  . WISDOM TOOTH EXTRACTION      OB History    Gravida  13   Para  4   Term  4   Preterm  0   AB  9   Living  4     SAB  1   TAB  6   Ectopic  2   Multiple  0   Live Births  4        Obstetric Comments  SAB x 1. Ectopic x 1. TAB x 6.          Home Medications    Prior to Admission medications   Medication Sig Start Date End Date Taking? Authorizing Provider  albuterol (PROVENTIL HFA;VENTOLIN HFA) 108 (90 Base) MCG/ACT inhaler Inhale 2 puffs into the lungs every 6 (six) hours as needed for wheezing or shortness of breath. 03/12/18   Georgetta HaberBurky, Annora Guderian B, NP  azithromycin (ZITHROMAX) 250 MG tablet Take 2 tablets (500 mg total) by mouth daily for 1 day, THEN 1 tablet (250 mg total) daily for 4 days.  03/12/18 03/17/18  Georgetta HaberBurky, Tilton Marsalis B, NP  benzonatate (TESSALON) 100 MG capsule Take 1 capsule (100 mg total) by mouth every 8 (eight) hours. 03/12/18   Georgetta HaberBurky, Shatika Grinnell B, NP    Family History Family History  Problem Relation Age of Onset  . Anesthesia problems Neg Hx   . Other Neg Hx     Social History Social History   Tobacco Use  . Smoking status: Never Smoker  . Smokeless tobacco: Never Used  Substance Use Topics  . Alcohol use: No  . Drug use: No     Allergies   Oseltamivir phosphate   Review of Systems Review of Systems   Physical Exam Triage Vital Signs ED Triage Vitals [03/12/18 1602]  Enc Vitals Group     BP 114/77     Pulse Rate 95     Resp 18     Temp 98.7 F (37.1 C)     Temp Source Oral     SpO2 99 %     Weight      Height      Head Circumference      Peak Flow      Pain Score  Pain Loc      Pain Edu?      Excl. in GC?    No data found.  Updated Vital Signs BP 114/77 (BP Location: Left Arm)   Pulse 95   Temp 98.7 F (37.1 C) (Oral)   Resp 18   SpO2 99%    Physical Exam  Constitutional: She is oriented to person, place, and time. She appears well-developed and well-nourished. No distress.  HENT:  Head: Normocephalic and atraumatic.  Right Ear: Tympanic membrane, external ear and ear canal normal.  Left Ear: Tympanic membrane, external ear and ear canal normal.  Nose: Nose normal.  Mouth/Throat: Uvula is midline, oropharynx is clear and moist and mucous membranes are normal. No tonsillar exudate.  Eyes: Pupils are equal, round, and reactive to light. Conjunctivae and EOM are normal.  Cardiovascular: Normal rate, regular rhythm and normal heart sounds.  Pulmonary/Chest: Effort normal and breath sounds normal.  Strong dry cough noted intermittently; lungs clear   Neurological: She is alert and oriented to person, place, and time.  Skin: Skin is warm and dry.     UC Treatments / Results  Labs (all labs ordered are listed, but  only abnormal results are displayed) Labs Reviewed - No data to display  EKG None  Radiology No results found.  Procedures Procedures (including critical care time)  Medications Ordered in UC Medications - No data to display  Initial Impression / Assessment and Plan / UC Course  I have reviewed the triage vital signs and the nursing notes.  Pertinent labs & imaging results that were available during my care of the patient were reviewed by me and considered in my medical decision making (see chart for details).     Non toxic in appearance. Afebrile. No tachycardia, tachypnea or hypoxia. Cough noted. Has been present for at least 12 days. Will cover empirically with azithromycin. Inhaler as needed, tessalon as needed. Return precautions provided. Patient verbalized understanding and agreeable to plan.    Final Clinical Impressions(s) / UC Diagnoses   Final diagnoses:  Acute bronchitis, unspecified organism     Discharge Instructions     Push fluids to ensure adequate hydration and keep secretions thin.  Tylenol and/or ibuprofen as needed for pain or fevers.  Complete course of antibiotics.  Use of inhaler as needed for chest tightness or wheezing. Tessalon capsules as needed for coughing. May continue with over the counter treatments as needed for symptoms.  If symptoms worsen or do not improve in the next week to return to be seen or to follow up with your PCP.     ED Prescriptions    Medication Sig Dispense Auth. Provider   azithromycin (ZITHROMAX) 250 MG tablet Take 2 tablets (500 mg total) by mouth daily for 1 day, THEN 1 tablet (250 mg total) daily for 4 days. 6 tablet Linus Mako B, NP   albuterol (PROVENTIL HFA;VENTOLIN HFA) 108 (90 Base) MCG/ACT inhaler Inhale 2 puffs into the lungs every 6 (six) hours as needed for wheezing or shortness of breath. 1 Inhaler Lavonna Lampron B, NP   benzonatate (TESSALON) 100 MG capsule Take 1 capsule (100 mg total) by mouth every  8 (eight) hours. 21 capsule Georgetta Haber, NP     Controlled Substance Prescriptions [place] Controlled Substance Registry consulted? Not Applicable   Georgetta Haber, NP 03/12/18 1715

## 2018-03-12 NOTE — ED Triage Notes (Signed)
Pt sts uri sx with cough and congestion

## 2018-03-20 ENCOUNTER — Encounter: Payer: Self-pay | Admitting: Obstetrics

## 2018-03-20 ENCOUNTER — Ambulatory Visit (INDEPENDENT_AMBULATORY_CARE_PROVIDER_SITE_OTHER): Payer: Medicaid Other

## 2018-03-20 VITALS — BP 128/82 | HR 93 | Wt 133.4 lb

## 2018-03-20 DIAGNOSIS — N912 Amenorrhea, unspecified: Secondary | ICD-10-CM | POA: Diagnosis not present

## 2018-03-20 DIAGNOSIS — Z3201 Encounter for pregnancy test, result positive: Secondary | ICD-10-CM | POA: Diagnosis not present

## 2018-03-20 LAB — POCT URINE PREGNANCY: PREG TEST UR: POSITIVE — AB

## 2018-03-20 NOTE — Progress Notes (Signed)
Pt is here for UPT. LMP 02/17/18. UPT positive. Pt advised to make new OB appointment on or around 04/19/18. Pt verbalized understanding.

## 2018-03-23 ENCOUNTER — Other Ambulatory Visit: Payer: Self-pay

## 2018-03-23 DIAGNOSIS — Z3490 Encounter for supervision of normal pregnancy, unspecified, unspecified trimester: Secondary | ICD-10-CM

## 2018-03-23 MED ORDER — PRENATAL MULTIVITAMIN CH: 1.0000 | ORAL_TABLET | Freq: Every day | ORAL | Status: DC

## 2018-03-26 ENCOUNTER — Telehealth: Payer: Self-pay

## 2018-03-26 NOTE — Telephone Encounter (Signed)
Response sent to patient via MyChart.

## 2018-03-27 ENCOUNTER — Other Ambulatory Visit: Payer: Self-pay | Admitting: Obstetrics

## 2018-03-27 DIAGNOSIS — Z3480 Encounter for supervision of other normal pregnancy, unspecified trimester: Secondary | ICD-10-CM

## 2018-03-27 MED ORDER — VITAFOL ULTRA 29-0.6-0.4-200 MG PO CAPS: 1.0000 | ORAL_CAPSULE | Freq: Every day | ORAL | 4 refills | Status: DC

## 2018-03-27 NOTE — Progress Notes (Signed)
Patient notified

## 2018-04-05 ENCOUNTER — Ambulatory Visit (HOSPITAL_COMMUNITY)
Admission: EM | Admit: 2018-04-05 | Discharge: 2018-04-05 | Disposition: A | Discharge status: Home / Self Care | Payer: Medicaid Other | Attending: Internal Medicine | Admitting: Internal Medicine

## 2018-04-05 ENCOUNTER — Other Ambulatory Visit: Payer: Self-pay

## 2018-04-05 ENCOUNTER — Encounter (HOSPITAL_COMMUNITY): Payer: Self-pay | Admitting: Emergency Medicine

## 2018-04-05 DIAGNOSIS — Z3201 Encounter for pregnancy test, result positive: Secondary | ICD-10-CM

## 2018-04-05 DIAGNOSIS — Z3A01 Less than 8 weeks gestation of pregnancy: Secondary | ICD-10-CM

## 2018-04-05 DIAGNOSIS — R110 Nausea: Secondary | ICD-10-CM | POA: Diagnosis not present

## 2018-04-05 LAB — POCT URINALYSIS DIP (DEVICE)
BILIRUBIN URINE: NEGATIVE
Glucose, UA: NEGATIVE mg/dL
HGB URINE DIPSTICK: NEGATIVE
KETONES UR: NEGATIVE mg/dL
LEUKOCYTES UA: NEGATIVE
NITRITE: NEGATIVE
PH: 5.5 (ref 5.0–8.0)
Protein, ur: NEGATIVE mg/dL
SPECIFIC GRAVITY, URINE: 1.025 (ref 1.005–1.030)
Urobilinogen, UA: 0.2 mg/dL (ref 0.0–1.0)

## 2018-04-05 LAB — POCT PREGNANCY, URINE: Preg Test, Ur: POSITIVE — AB

## 2018-04-05 MED ORDER — DOXYLAMINE-PYRIDOXINE 10-10 MG PO TBEC: 2.0000 | DELAYED_RELEASE_TABLET | Freq: Every day | ORAL | 1 refills | Status: DC

## 2018-04-05 NOTE — ED Triage Notes (Signed)
Nausea for 4 days.  Constant nausea, no vomiting.  Last bm was 2 hours ago and normal for patient

## 2018-04-11 ENCOUNTER — Other Ambulatory Visit: Payer: Self-pay

## 2018-04-11 ENCOUNTER — Encounter (HOSPITAL_COMMUNITY): Payer: Self-pay | Admitting: *Deleted

## 2018-04-11 ENCOUNTER — Inpatient Hospital Stay (HOSPITAL_COMMUNITY)
Admission: AD | Admit: 2018-04-11 | Discharge: 2018-04-11 | Disposition: A | Discharge status: Home / Self Care | Payer: Medicaid Other | Source: Ambulatory Visit | Attending: Obstetrics and Gynecology | Admitting: Obstetrics and Gynecology

## 2018-04-11 ENCOUNTER — Inpatient Hospital Stay (HOSPITAL_COMMUNITY): Payer: Medicaid Other

## 2018-04-11 DIAGNOSIS — Z3A01 Less than 8 weeks gestation of pregnancy: Secondary | ICD-10-CM | POA: Diagnosis not present

## 2018-04-11 DIAGNOSIS — O208 Other hemorrhage in early pregnancy: Secondary | ICD-10-CM | POA: Insufficient documentation

## 2018-04-11 DIAGNOSIS — O26859 Spotting complicating pregnancy, unspecified trimester: Secondary | ICD-10-CM

## 2018-04-11 DIAGNOSIS — R110 Nausea: Secondary | ICD-10-CM

## 2018-04-11 DIAGNOSIS — O26891 Other specified pregnancy related conditions, first trimester: Secondary | ICD-10-CM | POA: Insufficient documentation

## 2018-04-11 DIAGNOSIS — O209 Hemorrhage in early pregnancy, unspecified: Secondary | ICD-10-CM

## 2018-04-11 DIAGNOSIS — N939 Abnormal uterine and vaginal bleeding, unspecified: Secondary | ICD-10-CM | POA: Diagnosis present

## 2018-04-11 DIAGNOSIS — R109 Unspecified abdominal pain: Secondary | ICD-10-CM | POA: Diagnosis present

## 2018-04-11 DIAGNOSIS — O26851 Spotting complicating pregnancy, first trimester: Secondary | ICD-10-CM | POA: Diagnosis not present

## 2018-04-11 LAB — CBC WITH DIFFERENTIAL/PLATELET
Basophils Absolute: 0 10*3/uL (ref 0.0–0.1)
Basophils Relative: 0 %
EOS ABS: 0 10*3/uL (ref 0.0–0.7)
EOS PCT: 0 %
HCT: 38.6 % (ref 36.0–46.0)
HEMOGLOBIN: 12.8 g/dL (ref 12.0–15.0)
LYMPHS ABS: 2 10*3/uL (ref 0.7–4.0)
Lymphocytes Relative: 21 %
MCH: 31.1 pg (ref 26.0–34.0)
MCHC: 33.2 g/dL (ref 30.0–36.0)
MCV: 93.7 fL (ref 78.0–100.0)
Monocytes Absolute: 0.3 10*3/uL (ref 0.1–1.0)
Monocytes Relative: 4 %
Neutro Abs: 7.1 10*3/uL (ref 1.7–7.7)
Neutrophils Relative %: 75 %
PLATELETS: 269 10*3/uL (ref 150–400)
RBC: 4.12 MIL/uL (ref 3.87–5.11)
RDW: 12.8 % (ref 11.5–15.5)
WBC: 9.5 10*3/uL (ref 4.0–10.5)

## 2018-04-11 LAB — URINALYSIS, ROUTINE W REFLEX MICROSCOPIC
BILIRUBIN URINE: NEGATIVE
Glucose, UA: NEGATIVE mg/dL
HGB URINE DIPSTICK: NEGATIVE
Ketones, ur: NEGATIVE mg/dL
Leukocytes, UA: NEGATIVE
Nitrite: NEGATIVE
PH: 7 (ref 5.0–8.0)
Protein, ur: NEGATIVE mg/dL
SPECIFIC GRAVITY, URINE: 1.023 (ref 1.005–1.030)

## 2018-04-11 LAB — HCG, QUANTITATIVE, PREGNANCY: HCG, BETA CHAIN, QUANT, S: 237127 m[IU]/mL — AB (ref <5)

## 2018-04-11 LAB — POCT PREGNANCY, URINE: Preg Test, Ur: POSITIVE — AB

## 2018-04-11 MED ORDER — PROMETHAZINE HCL 12.5 MG PO TABS: 12.5000 mg | ORAL_TABLET | Freq: Four times a day (QID) | ORAL | 0 refills | Status: DC | PRN

## 2018-04-11 NOTE — Discharge Instructions (Signed)
Eating Plan for Nausea in pregnancy Hyperemesis gravidarum is a severe form of morning sickness. Because this condition causes severe nausea and vomiting, it can lead to dehydration, malnutrition, and weight loss. One way to lessen the symptoms of nausea and vomiting is to follow the eating plan for hyperemesis gravidarum. It is often used along with prescribed medicines to control your symptoms. What can I do to relieve my symptoms? Listen to your body. Everyone is different and has different preferences. Find what works best for you. Take any of the following actions that are helpful to you:  Eat and drink slowly.  Eat 5-6 small meals daily instead of 3 large meals.  Eat crackers before you get out of bed in the morning.  Try having a snack in the middle of the night.  Starchy foods are usually tolerated well. Examples include cereal, toast, bread, potatoes, pasta, rice, and pretzels.  Ginger may help with nausea. Add  tsp ground ginger to hot tea or choose ginger tea.  Try drinking 100% fruit juice or an electrolyte drink. An electrolyte drink contains sodium, potassium, and chloride.  Continue to take your prenatal vitamins as told by your health care provider. If you are having trouble taking your prenatal vitamins, talk with your health care provider about different options.  Include at least 1 serving of protein with your meals and snacks. Protein options include meats or poultry, beans, nuts, eggs, and yogurt. Try eating a protein-rich snack before bed. Examples of these snacks include cheese and crackers or half of a peanut butter or [place] sandwich.  Consider eliminating foods that trigger your symptoms. These may include spicy foods, coffee, high-fat foods, very sweet foods, and acidic foods.  Try meals that have more protein combined with bland, salty, lower-fat, and dry foods, such as nuts, seeds, pretzels, crackers, and cereal.  Talk with your healthcare provider about  starting a supplement of vitamin B6.  Have fluids that are cold, clear, and carbonated or sour. Examples include lemonade, ginger ale, lemon-lime soda, ice water, and sparkling water.  Try lemon or mint tea.  Try brushing your teeth or using a mouth rinse after meals.  What should I avoid to reduce my symptoms? Avoiding some of the following things may help reduce your symptoms.  Foods with strong smells. Try eating meals in well-ventilated areas that are free of odors.  Drinking water or other beverages with meals. Try not to drink anything during the 30 minutes before and after your meals.  Drinking more than 1 cup of fluid at a time. Sometimes using a straw helps.  Fried or high-fat foods, such as butter and cream sauces.  Spicy foods.  Skipping meals as best as you can. Nausea can be more intense on an empty stomach. If you cannot tolerate food at that time, do not force it. Try sucking on ice chips or other frozen items, and make up for missed calories later.  Lying down within 2 hours after eating.  Environmental triggers. These may include smoky rooms, closed spaces, rooms with strong smells, warm or humid places, overly loud and noisy rooms, and rooms with motion or flickering lights.  Quick and sudden changes in your movement.  This information is not intended to replace advice given to you by your health care provider. Make sure you discuss any questions you have with your health care provider. Document Released: 07/03/2007 Document Revised: 05/04/2016 Document Reviewed: 04/05/2016 Elsevier Interactive Patient Education  [employer]2018 Else[employer] Inc[employer]

## 2018-04-11 NOTE — MAU Note (Signed)
Started cramping like a period 2 days ago, yesterday off and on for an hour- worse then a period.  Mild cramps today.  Stringy clear with brown spotting for 2 days.  Hx of ectopic, advice nurse told her to come

## 2018-04-11 NOTE — MAU Note (Signed)
Urine in lab - POCT done

## 2018-04-11 NOTE — MAU Provider Note (Signed)
History     CSN: [id number]  Arrival date and time: 04/11/18 1406   First Provider Initiated Contact with Patient 04/11/18 1818      Chief Complaint  Patient presents with  . Vaginal Bleeding  . Abdominal Pain   HPI  [name] is a 32 y.o. [id number] at 835w4d who presents to MAU today with complaint of blood tinged mucous discharge. She denies bright red bleeding, recent intercourse, abdominal pain, UTI symptoms or fever. She has had nausea without vomiting or diarrhea. She was given Diclegis, but was unable to get it filled due to insurance issues.   OB History    Gravida  14   Para  4   Term  4   Preterm  0   AB  9   Living  4     SAB  1   TAB  6   Ectopic  2   Multiple  0   Live Births  4        Obstetric Comments  SAB x 1. Ectopic x 1. TAB x 6.         Past Medical History:  Diagnosis Date  . Gonorrhea     Past Surgical History:  Procedure Laterality Date  . CESAREAN SECTION  05/09/2012   Procedure: CESAREAN SECTION;  Surgeon: Antionette CharLisa Jackson-Moore, MD;  Location: [facility];  Service: Gynecology;  Laterality: N/A;  . WISDOM TOOTH EXTRACTION      Family History  Problem Relation Age of Onset  . Anesthesia problems Neg Hx   . Other Neg Hx     Social History   Tobacco Use  . Smoking status: Never Smoker  . Smokeless tobacco: Never Used  Substance Use Topics  . Alcohol use: No  . Drug use: No    Allergies:  Allergies  Allergen Reactions  . Oseltamivir Phosphate Rash    No medications prior to admission.    Review of Systems  Constitutional: Negative for fever.  Gastrointestinal: Positive for nausea. Negative for abdominal pain, constipation, diarrhea and vomiting.  Genitourinary: Positive for vaginal bleeding and vaginal discharge. Negative for dysuria, frequency and urgency.   Physical Exam   Blood pressure 113/69, pulse 89, temperature 98.7 F (37.1 C), temperature source Oral, resp. rate 16, weight 135 lb 8 oz (61.5 kg),  last menstrual period 02/17/2018.  Physical Exam  Nursing note and vitals reviewed. Constitutional: She is oriented to person, place, and time. She appears well-developed and well-nourished. No distress.  HENT:  Head: Normocephalic and atraumatic.  Cardiovascular: Normal rate.  Respiratory: Effort normal.  GI: Soft. She exhibits no distension. There is no tenderness.  Genitourinary:  Genitourinary Comments: Patient declined pelvic exam  Neurological: She is alert and oriented to person, place, and time.  Skin: Skin is warm and dry. No erythema.  Psychiatric: She has a normal mood and affect.    Results for orders placed or performed during the hospital encounter of 04/11/18 (from the past 24 hour(s))  Urinalysis, Routine w reflex microscopic     Status: None   Collection Time: 04/11/18  2:54 PM  Result Value Ref Range   Color, Urine YELLOW YELLOW   APPearance CLEAR CLEAR   Specific Gravity, Urine 1.023 1.005 - 1.030   pH 7.0 5.0 - 8.0   Glucose, UA NEGATIVE NEGATIVE mg/dL   Hgb urine dipstick NEGATIVE NEGATIVE   Bilirubin Urine NEGATIVE NEGATIVE   Ketones, ur NEGATIVE NEGATIVE mg/dL   Protein, ur NEGATIVE NEGATIVE mg/dL  Nitrite NEGATIVE NEGATIVE   Leukocytes, UA NEGATIVE NEGATIVE  Pregnancy, urine POC     Status: Abnormal   Collection Time: 04/11/18  2:57 PM  Result Value Ref Range   Preg Test, Ur POSITIVE (A) NEGATIVE  CBC with Differential/Platelet     Status: None   Collection Time: 04/11/18  3:37 PM  Result Value Ref Range   WBC 9.5 4.0 - 10.5 K/uL   RBC 4.12 3.87 - 5.11 MIL/uL   Hemoglobin 12.8 12.0 - 15.0 g/dL   HCT 60.4 54.0 - 98.1 %   MCV 93.7 78.0 - 100.0 fL   MCH 31.1 26.0 - 34.0 pg   MCHC 33.2 30.0 - 36.0 g/dL   RDW 19.1 47.8 - 29.5 %   Platelets 269 150 - 400 K/uL   Neutrophils Relative % 75 %   Neutro Abs 7.1 1.7 - 7.7 K/uL   Lymphocytes Relative 21 %   Lymphs Abs 2.0 0.7 - 4.0 K/uL   Monocytes Relative 4 %   Monocytes Absolute 0.3 0.1 - 1.0 K/uL    Eosinophils Relative 0 %   Eosinophils Absolute 0.0 0.0 - 0.7 K/uL   Basophils Relative 0 %   Basophils Absolute 0.0 0.0 - 0.1 K/uL  hCG, quantitative, pregnancy     Status: Abnormal   Collection Time: 04/11/18  3:37 PM  Result Value Ref Range   hCG, Beta Chain, Quant, S 237,127 (H) <5 mIU/mL   US Ob Comp Less 14 Wks  Result Date: 04/11/2018 CLINICAL DATA:  Cramping, light spotting. EXAM: OBSTETRIC <14 WK ULTRASOUND TECHNIQUE: Transabdominal ultrasound was performed for evaluation of the gestation as well as the maternal uterus and adnexal regions. COMPARISON:  None. FINDINGS: Intrauterine gestational sac: Single Yolk sac:  Visualized. Embryo:  Visualized. Cardiac Activity: Visualized. Heart Rate: 148 bpm MSD:   mm    w     d CRL:   10.6 mm   7 w 1 d                  [place] EDC: 11/27/2018 Subchorionic hemorrhage:  None visualized. Maternal uterus/adnexae: Maternal ovaries not seen, but no mass or free fluid identified within either adnexal region. IMPRESSION: Single live intrauterine pregnancy with estimated gestational age of [redacted] weeks and 1 day. Maternal ovaries not visualized, however, no mass or free fluid is seen within either adnexal region. Electronically Signed   By: Bary Richard M.D.   On: 04/11/2018 17:35    MAU Course  Procedures None  MDM +UPT UA, CBC, ABO/Rh, quant hCG and [place] today to rule out ectopic pregnancy Patient declined pelvic exam today   Assessment and Plan  A: SIUP at [redacted]w[redacted]d Spotting in pregnancy, first trimester  Nausea without vomiting   P:  Discharge home Rx for phenergan given  OTC medication list given  First trimester  precautions discussed Patient advised to follow-up with [facility] as planned to start prenatal care  Patient may return to MAU as needed or if her condition were to change or worsen  Vonzella Nipple, PA-C 04/11/2018, 8:30 PM

## 2018-04-12 LAB — HIV ANTIBODY (ROUTINE TESTING W REFLEX): HIV Screen 4th Generation wRfx: NONREACTIVE

## 2018-04-12 LAB — RPR: RPR Ser Ql: NONREACTIVE

## 2018-05-07 ENCOUNTER — Ambulatory Visit (INDEPENDENT_AMBULATORY_CARE_PROVIDER_SITE_OTHER): Payer: Medicaid Other | Admitting: Obstetrics and Gynecology

## 2018-05-07 ENCOUNTER — Encounter: Payer: Self-pay | Admitting: Obstetrics and Gynecology

## 2018-05-07 ENCOUNTER — Other Ambulatory Visit (HOSPITAL_COMMUNITY)
Admission: RE | Admit: 2018-05-07 | Discharge: 2018-05-07 | Disposition: A | Discharge status: Home / Self Care | Payer: Medicaid Other | Source: Ambulatory Visit | Attending: Obstetrics and Gynecology | Admitting: Obstetrics and Gynecology

## 2018-05-07 DIAGNOSIS — Z3481 Encounter for supervision of other normal pregnancy, first trimester: Secondary | ICD-10-CM

## 2018-05-07 DIAGNOSIS — Z3A11 11 weeks gestation of pregnancy: Secondary | ICD-10-CM | POA: Insufficient documentation

## 2018-05-07 DIAGNOSIS — Z3480 Encounter for supervision of other normal pregnancy, unspecified trimester: Secondary | ICD-10-CM | POA: Insufficient documentation

## 2018-05-07 DIAGNOSIS — O34219 Maternal care for unspecified type scar from previous cesarean delivery: Secondary | ICD-10-CM | POA: Insufficient documentation

## 2018-05-07 NOTE — Progress Notes (Signed)
Subjective:    [name] [name] is a [id number] 552w2d being seen today for her first obstetrical visit.  Her obstetrical history is significant for previous cesarean section due to failure to descent (persistent OP presentation). Patient does intend to breast feed. Pregnancy history fully reviewed.  Patient reports nausea and vomiting.  Vitals:   05/07/18 0925  BP: 115/80  Pulse: 84  Weight: 135 lb (61.2 kg)    HISTORY: OB History  Gravida Para Term Preterm AB Living  11 4 4  0 6 4  SAB TAB Ectopic Multiple Live Births  1 4 1  0 4    # Outcome Date GA Lbr Len/2nd Weight Sex Delivery Anes PTL Lv  11 Current           10 Ectopic 06/19/16 7033w6d    ECTOPIC     9 Term 05/09/12 3638w5d / 10:58 9 lb 13.3 oz (4.46 kg) M CS-LVertical EPI  LIV     Birth Comments: WNL   8 TAB 2010          7 TAB 2009          6 TAB 2009          5 TAB 2008          4 Term 2007 9378w0d 03:00 6 lb 7 oz (2.92 kg) F Vag-Spont EPI  LIV  3 Term 2005 6178w0d 06:00 5 lb 5 oz (2.41 kg) M Vag-Spont EPI  LIV  2 Term 2003 6678w0d 23:00 8 lb (3.629 kg) F Vag-Spont EPI  LIV  1 SAB 2002            Obstetric Comments  SAB x 1. Ectopic x 1. TAB x 4.    Past Medical History:  Diagnosis Date  . Gonorrhea    Past Surgical History:  Procedure Laterality Date  . CESAREAN SECTION  05/09/2012   Procedure: CESAREAN SECTION;  Surgeon: Antionette CharLisa Jackson-Moore, MD;  Location: [facility];  Service: Gynecology;  Laterality: N/A;  . WISDOM TOOTH EXTRACTION     Family History  Problem Relation Age of Onset  . Heart disease Mother   . Cancer Father   . Anesthesia problems Neg Hx   . Other Neg Hx      Exam    Uterus:     Pelvic Exam:    Perineum: No Hemorrhoids, Normal Perineum   Vulva: normal   Vagina:  normal mucosa, normal discharge   pH:    Cervix: multiparous appearance and cervix is closed and long   Adnexa: normal adnexa and no mass, fullness, tenderness   Bony Pelvis: gynecoid  System: Breast:  normal appearance, no masses or  tenderness   Skin: normal coloration and turgor, no rashes    Neurologic: oriented, no focal deficits   Extremities: normal strength, tone, and muscle mass   HEENT extra ocular movement intact   Mouth/Teeth mucous membranes moist, pharynx normal without lesions and dental hygiene good   Neck supple   Cardiovascular: regular rate and rhythm   Respiratory:  chest clear, no wheezing, crepitations, rhonchi, normal symmetric air entry   Abdomen: soft, non-tender; bowel sounds normal; no masses,  no organomegaly   Urinary:       Assessment:    Pregnancy: [id number] Patient Active Problem List   Diagnosis Date Noted  . Supervision of other normal pregnancy, antepartum 05/07/2018  . Previous cesarean delivery affecting pregnancy 05/07/2018  . Abortion, incomplete 08/06/2016  . left cornual 08/02/2016  . Vaginitis and vulvovaginitis, unspecified  03/19/2013        Plan:     Initial labs drawn. Prenatal vitamins. Problem list reviewed and updated. Genetic Screening discussed : Panoram ordered.  Ultrasound discussed; fetal survey: requested.  Follow up in 4 weeks. 50% of 30 min visit spent on counseling and coordination of care.     Jamie Hafford 05/07/2018

## 2018-05-07 NOTE — Patient Instructions (Signed)
 Second Trimester of Pregnancy The second trimester is from week 14 through week 27 (months 4 through 6). The second trimester is often a time when you feel your best. Your body has adjusted to being pregnant, and you begin to feel better physically. Usually, morning sickness has lessened or quit completely, you may have more energy, and you may have an increase in appetite. The second trimester is also a time when the fetus is growing rapidly. At the end of the sixth month, the fetus is about 9 inches long and weighs about 1 pounds. You will likely begin to feel the baby move (quickening) between 16 and 20 weeks of pregnancy. Body changes during your second trimester Your body continues to go through many changes during your second trimester. The changes vary from woman to woman.  Your weight will continue to increase. You will notice your lower abdomen bulging out.  You may begin to get stretch marks on your hips, abdomen, and breasts.  You may develop headaches that can be relieved by medicines. The medicines should be approved by your health care provider.  You may urinate more often because the fetus is pressing on your bladder.  You may develop or continue to have heartburn as a result of your pregnancy.  You may develop constipation because certain hormones are causing the muscles that push waste through your intestines to slow down.  You may develop hemorrhoids or swollen, bulging veins (varicose veins).  You may have back pain. This is caused by: ? Weight gain. ? Pregnancy hormones that are relaxing the joints in your pelvis. ? A shift in weight and the muscles that support your balance.  Your breasts will continue to grow and they will continue to become tender.  Your gums may bleed and may be sensitive to brushing and flossing.  Dark spots or blotches (chloasma, mask of pregnancy) may develop on your face. This will likely fade after the baby is born.  A dark line from  your belly button to the pubic area (linea nigra) may appear. This will likely fade after the baby is born.  You may have changes in your hair. These can include thickening of your hair, rapid growth, and changes in texture. Some women also have hair loss during or after pregnancy, or hair that feels dry or thin. Your hair will most likely return to normal after your baby is born.  What to expect at prenatal visits During a routine prenatal visit:  You will be weighed to make sure you and the fetus are growing normally.  Your blood pressure will be taken.  Your abdomen will be measured to track your baby's growth.  The fetal heartbeat will be listened to.  Any test results from the previous visit will be discussed.  Your health care provider may ask you:  How you are feeling.  If you are feeling the baby move.  If you have had any abnormal symptoms, such as leaking fluid, bleeding, severe headaches, or abdominal cramping.  If you are using any tobacco products, including cigarettes, chewing tobacco, and electronic cigarettes.  If you have any questions.  Other tests that may be performed during your second trimester include:  Blood tests that check for: ? Low iron levels (anemia). ? High blood sugar that affects pregnant women (gestational diabetes) between 24 and 28 weeks. ? Rh antibodies. This is to check for a protein on red blood cells (Rh factor).  Urine tests to check for infections, diabetes,   or protein in the urine.  An ultrasound to confirm the proper growth and development of the baby.  An amniocentesis to check for possible genetic problems.  Fetal screens for spina bifida and Down syndrome.  HIV (human immunodeficiency virus) testing. Routine prenatal testing includes screening for HIV, unless you choose not to have this test.  Follow these instructions at home: Medicines  Follow your health care provider's instructions regarding medicine use. Specific  medicines may be either safe or unsafe to take during pregnancy.  Take a prenatal vitamin that contains at least 600 micrograms (mcg) of folic acid.  If you develop constipation, try taking a stool softener if your health care provider approves. Eating and drinking  Eat a balanced diet that includes fresh fruits and vegetables, whole grains, good sources of protein such as meat, eggs, or tofu, and low-fat dairy. Your health care provider will help you determine the amount of weight gain that is right for you.  Avoid raw meat and uncooked cheese. These carry germs that can cause birth defects in the baby.  If you have low calcium intake from food, talk to your health care provider about whether you should take a daily calcium supplement.  Limit foods that are high in fat and processed sugars, such as fried and sweet foods.  To prevent constipation: ? Drink enough fluid to keep your urine clear or pale yellow. ? Eat foods that are high in fiber, such as fresh fruits and vegetables, whole grains, and beans. Activity  Exercise only as directed by your health care provider. Most women can continue their usual exercise routine during pregnancy. Try to exercise for 30 minutes at least 5 days a week. Stop exercising if you experience uterine contractions.  Avoid heavy lifting, wear low heel shoes, and practice good posture.  A sexual relationship may be continued unless your health care provider directs you otherwise. Relieving pain and discomfort  Wear a good support bra to prevent discomfort from breast tenderness.  Take warm sitz baths to soothe any pain or discomfort caused by hemorrhoids. Use hemorrhoid cream if your health care provider approves.  Rest with your legs elevated if you have leg cramps or low back pain.  If you develop varicose veins, wear support hose. Elevate your feet for 15 minutes, 3-4 times a day. Limit salt in your diet. Prenatal Care  Write down your questions.  Take them to your prenatal visits.  Keep all your prenatal visits as told by your health care provider. This is important. Safety  Wear your seat belt at all times when driving.  Make a list of emergency phone numbers, including numbers for family, friends, the hospital, and police and fire departments. General instructions  Ask your health care provider for a referral to a local prenatal education class. Begin classes no later than the beginning of month 6 of your pregnancy.  Ask for help if you have counseling or nutritional needs during pregnancy. Your health care provider can offer advice or refer you to specialists for help with various needs.  Do not use hot tubs, steam rooms, or saunas.  Do not douche or use tampons or scented sanitary pads.  Do not cross your legs for long periods of time.  Avoid cat litter boxes and soil used by cats. These carry germs that can cause birth defects in the baby and possibly loss of the fetus by miscarriage or stillbirth.  Avoid all smoking, herbs, alcohol, and unprescribed drugs. Chemicals in these products   can affect the formation and growth of the baby.  Do not use any products that contain nicotine or tobacco, such as cigarettes and e-cigarettes. If you need help quitting, ask your health care provider.  Visit your dentist if you have not gone yet during your pregnancy. Use a soft toothbrush to brush your teeth and be gentle when you floss. Contact a health care provider if:  You have dizziness.  You have mild pelvic cramps, pelvic pressure, or nagging pain in the abdominal area.  You have persistent nausea, vomiting, or diarrhea.  You have a bad smelling vaginal discharge.  You have pain when you urinate. Get help right away if:  You have a fever.  You are leaking fluid from your vagina.  You have spotting or bleeding from your vagina.  You have severe abdominal cramping or pain.  You have rapid weight gain or weight  loss.  You have shortness of breath with chest pain.  You notice sudden or extreme swelling of your face, hands, ankles, feet, or legs.  You have not felt your baby move in over an hour.  You have severe headaches that do not go away when you take medicine.  You have vision changes. Summary  The second trimester is from week 14 through week 27 (months 4 through 6). It is also a time when the fetus is growing rapidly.  Your body goes through many changes during pregnancy. The changes vary from woman to woman.  Avoid all smoking, herbs, alcohol, and unprescribed drugs. These chemicals affect the formation and growth your baby.  Do not use any tobacco products, such as cigarettes, chewing tobacco, and e-cigarettes. If you need help quitting, ask your health care provider.  Contact your health care provider if you have any questions. Keep all prenatal visits as told by your health care provider. This is important. This information is not intended to replace advice given to you by your health care provider. Make sure you discuss any questions you have with your health care provider. Document Released: 08/30/2001 Document Revised: 10/11/2016 Document Reviewed: 10/11/2016 [employer] Interactive Patient Education  2018 [employer]   Contraception Choices Contraception, also called birth control, refers to methods or devices that prevent pregnancy. Hormonal methods Contraceptive implant A contraceptive implant is a thin, plastic tube that contains a hormone. It is inserted into the upper part of the arm. It can remain in place for up to 3 years. Progestin-only injections Progestin-only injections are injections of progestin, a synthetic form of the hormone progesterone. They are given every 3 months by a health care provider. Birth control pills Birth control pills are pills that contain hormones that prevent pregnancy. They must be taken once a day, preferably at the same time each  day. Birth control patch The birth control patch contains hormones that prevent pregnancy. It is placed on the skin and must be changed once a week for three weeks and removed on the fourth week. A prescription is needed to use this method of contraception. Vaginal ring A vaginal ring contains hormones that prevent pregnancy. It is placed in the vagina for three weeks and removed on the fourth week. After that, the process is repeated with a new ring. A prescription is needed to use this method of contraception. Emergency contraceptive Emergency contraceptives prevent pregnancy after unprotected sex. They come in pill form and can be taken up to 5 days after sex. They work best the sooner they are taken after having sex. Most emergency   contraceptives are available without a prescription. This method should not be used as your only form of birth control. Barrier methods Female condom A female condom is a thin sheath that is worn over the penis during sex. Condoms keep sperm from going inside a woman's body. They can be used with a spermicide to increase their effectiveness. They should be disposed after a single use. Female condom A female condom is a soft, loose-fitting sheath that is put into the vagina before sex. The condom keeps sperm from going inside a woman's body. They should be disposed after a single use. Diaphragm A diaphragm is a soft, dome-shaped barrier. It is inserted into the vagina before sex, along with a spermicide. The diaphragm blocks sperm from entering the uterus, and the spermicide kills sperm. A diaphragm should be left in the vagina for 6-8 hours after sex and removed within 24 hours. A diaphragm is prescribed and fitted by a health care provider. A diaphragm should be replaced every 1-2 years, after giving birth, after gaining more than 15 lb (6.8 kg), and after pelvic surgery. Cervical cap A cervical cap is a round, soft latex or plastic cup that fits over the cervix. It is  inserted into the vagina before sex, along with spermicide. It blocks sperm from entering the uterus. The cap should be left in place for 6-8 hours after sex and removed within 48 hours. A cervical cap must be prescribed and fitted by a health care provider. It should be replaced every 2 years. Sponge A sponge is a soft, circular piece of polyurethane foam with spermicide on it. The sponge helps block sperm from entering the uterus, and the spermicide kills sperm. To use it, you make it wet and then insert it into the vagina. It should be inserted before sex, left in for at least 6 hours after sex, and removed and thrown away within 30 hours. Spermicides Spermicides are chemicals that kill or block sperm from entering the cervix and uterus. They can come as a cream, jelly, suppository, foam, or tablet. A spermicide should be inserted into the vagina with an applicator at least 10-15 minutes before sex to allow time for it to work. The process must be repeated every time you have sex. Spermicides do not require a prescription. Intrauterine contraception Intrauterine device (IUD) An IUD is a T-shaped device that is put in a woman's uterus. There are two types:  Hormone IUD.This type contains progestin, a synthetic form of the hormone progesterone. This type can stay in place for 3-5 years.  Copper IUD.This type is wrapped in copper wire. It can stay in place for 10 years.  Permanent methods of contraception Female tubal ligation In this method, a woman's fallopian tubes are sealed, tied, or blocked during surgery to prevent eggs from traveling to the uterus. Hysteroscopic sterilization In this method, a small, flexible insert is placed into each fallopian tube. The inserts cause scar tissue to form in the fallopian tubes and block them, so sperm cannot reach an egg. The procedure takes about 3 months to be effective. Another form of birth control must be used during those 3 months. Female  sterilization This is a procedure to tie off the tubes that carry sperm (vasectomy). After the procedure, the man can still ejaculate fluid (semen). Natural planning methods Natural family planning In this method, a couple does not have sex on days when the woman could become pregnant. Calendar method This means keeping track of the length of each   menstrual cycle, identifying the days when pregnancy can happen, and not having sex on those days. Ovulation method In this method, a couple avoids sex during ovulation. Symptothermal method This method involves not having sex during ovulation. The woman typically checks for ovulation by watching changes in her temperature and in the consistency of cervical mucus. Post-ovulation method In this method, a couple waits to have sex until after ovulation. Summary  Contraception, also called birth control, means methods or devices that prevent pregnancy.  Hormonal methods of contraception include implants, injections, pills, patches, vaginal rings, and emergency contraceptives.  Barrier methods of contraception can include female condoms, female condoms, diaphragms, cervical caps, sponges, and spermicides.  There are two types of IUDs (intrauterine devices). An IUD can be put in a woman's uterus to prevent pregnancy for 3-5 years.  Permanent sterilization can be done through a procedure for males, females, or both.  Natural family planning methods involve not having sex on days when the woman could become pregnant. This information is not intended to replace advice given to you by your health care provider. Make sure you discuss any questions you have with your health care provider. Document Released: 09/05/2005 Document Revised: 10/08/2016 Document Reviewed: 10/08/2016 [employer] Interactive Patient Education  2018 [employer]   Breastfeeding Choosing to breastfeed is one of the best decisions you can make for yourself and your baby. A change in  hormones during pregnancy causes your breasts to make breast milk in your milk-producing glands. Hormones prevent breast milk from being released before your baby is born. They also prompt milk flow after birth. Once breastfeeding has begun, thoughts of your baby, as well as his or her sucking or crying, can stimulate the release of milk from your milk-producing glands. Benefits of breastfeeding Research shows that breastfeeding offers many health benefits for infants and mothers. It also offers a cost-free and convenient way to feed your baby. For your baby  Your first milk (colostrum) helps your baby's digestive system to function better.  Special cells in your milk (antibodies) help your baby to fight off infections.  Breastfed babies are less likely to develop asthma, allergies, obesity, or type 2 diabetes. They are also at lower risk for sudden infant death syndrome (SIDS).  Nutrients in breast milk are better able to meet your baby's needs compared to infant formula.  Breast milk improves your baby's brain development. For you  Breastfeeding helps to create a very special bond between you and your baby.  Breastfeeding is convenient. Breast milk costs nothing and is always available at the correct temperature.  Breastfeeding helps to burn calories. It helps you to lose the weight that you gained during pregnancy.  Breastfeeding makes your uterus return faster to its size before pregnancy. It also slows bleeding (lochia) after you give birth.  Breastfeeding helps to lower your risk of developing type 2 diabetes, osteoporosis, rheumatoid arthritis, cardiovascular disease, and breast, ovarian, uterine, and endometrial cancer later in life. Breastfeeding basics Starting breastfeeding  Find a comfortable place to sit or lie down, with your neck and back well-supported.  Place a pillow or a rolled-up blanket under your baby to bring him or her to the level of your breast (if you are  seated). Nursing pillows are specially designed to help support your arms and your baby while you breastfeed.  Make sure that your baby's tummy (abdomen) is facing your abdomen.  Gently massage your breast. With your fingertips, massage from the outer edges of your breast inward   toward the nipple. This encourages milk flow. If your milk flows slowly, you may need to continue this action during the feeding.  Support your breast with 4 fingers underneath and your thumb above your nipple (make the letter "C" with your hand). Make sure your fingers are well away from your nipple and your baby's mouth.  Stroke your baby's lips gently with your finger or nipple.  When your baby's mouth is open wide enough, quickly bring your baby to your breast, placing your entire nipple and as much of the areola as possible into your baby's mouth. The areola is the colored area around your nipple. ? More areola should be visible above your baby's upper lip than below the lower lip. ? Your baby's lips should be opened and extended outward (flanged) to ensure an adequate, comfortable latch. ? Your baby's tongue should be between his or her lower gum and your breast.  Make sure that your baby's mouth is correctly positioned around your nipple (latched). Your baby's lips should create a seal on your breast and be turned out (everted).  It is common for your baby to suck about 2-3 minutes in order to start the flow of breast milk. Latching Teaching your baby how to latch onto your breast properly is very important. An improper latch can cause nipple pain, decreased milk supply, and poor weight gain in your baby. Also, if your baby is not latched onto your nipple properly, he or she may swallow some air during feeding. This can make your baby fussy. Burping your baby when you switch breasts during the feeding can help to get rid of the air. However, teaching your baby to latch on properly is still the best way to prevent  fussiness from swallowing air while breastfeeding. Signs that your baby has successfully latched onto your nipple  Silent tugging or silent sucking, without causing you pain. Infant's lips should be extended outward (flanged).  Swallowing heard between every 3-4 sucks once your milk has started to flow (after your let-down milk reflex occurs).  Muscle movement above and in front of his or her ears while sucking.  Signs that your baby has not successfully latched onto your nipple  Sucking sounds or smacking sounds from your baby while breastfeeding.  Nipple pain.  If you think your baby has not latched on correctly, slip your finger into the corner of your baby's mouth to break the suction and place it between your baby's gums. Attempt to start breastfeeding again. Signs of successful breastfeeding Signs from your baby  Your baby will gradually decrease the number of sucks or will completely stop sucking.  Your baby will fall asleep.  Your baby's body will relax.  Your baby will retain a small amount of milk in his or her mouth.  Your baby will let go of your breast by himself or herself.  Signs from you  Breasts that have increased in firmness, weight, and size 1-3 hours after feeding.  Breasts that are softer immediately after breastfeeding.  Increased milk volume, as well as a change in milk consistency and color by the fifth day of breastfeeding.  Nipples that are not sore, cracked, or bleeding.  Signs that your baby is getting enough milk  Wetting at least 1-2 diapers during the first 24 hours after birth.  Wetting at least 5-6 diapers every 24 hours for the first week after birth. The urine should be clear or pale yellow by the age of 5 days.  Wetting 6-8   diapers every 24 hours as your baby continues to grow and develop.  At least 3 stools in a 24-hour period by the age of 5 days. The stool should be soft and yellow.  At least 3 stools in a 24-hour period by the  age of 7 days. The stool should be seedy and yellow.  No loss of weight greater than 10% of birth weight during the first 3 days of life.  Average weight gain of 4-7 oz (113-198 g) per week after the age of 4 days.  Consistent daily weight gain by the age of 5 days, without weight loss after the age of 2 weeks. After a feeding, your baby may spit up a small amount of milk. This is normal. Breastfeeding frequency and duration Frequent feeding will help you make more milk and can prevent sore nipples and extremely full breasts (breast engorgement). Breastfeed when you feel the need to reduce the fullness of your breasts or when your baby shows signs of hunger. This is called "breastfeeding on demand." Signs that your baby is hungry include:  Increased alertness, activity, or restlessness.  Movement of the head from side to side.  Opening of the mouth when the corner of the mouth or cheek is stroked (rooting).  Increased sucking sounds, smacking lips, cooing, sighing, or squeaking.  Hand-to-mouth movements and sucking on fingers or hands.  Fussing or crying.  Avoid introducing a pacifier to your baby in the first 4-6 weeks after your baby is born. After this time, you may choose to use a pacifier. Research has shown that pacifier use during the first year of a baby's life decreases the risk of sudden infant death syndrome (SIDS). Allow your baby to feed on each breast as long as he or she wants. When your baby unlatches or falls asleep while feeding from the first breast, offer the second breast. Because newborns are often sleepy in the first few weeks of life, you may need to awaken your baby to get him or her to feed. Breastfeeding times will vary from baby to baby. However, the following rules can serve as a guide to help you make sure that your baby is properly fed:  Newborns (babies 4 weeks of age or younger) may breastfeed every 1-3 hours.  Newborns should not go without breastfeeding  for longer than 3 hours during the day or 5 hours during the night.  You should breastfeed your baby a minimum of 8 times in a 24-hour period.  Breast milk pumping Pumping and storing breast milk allows you to make sure that your baby is exclusively fed your breast milk, even at times when you are unable to breastfeed. This is especially important if you go back to work while you are still breastfeeding, or if you are not able to be present during feedings. Your lactation consultant can help you find a method of pumping that works best for you and give you guidelines about how long it is safe to store breast milk. Caring for your breasts while you breastfeed Nipples can become dry, cracked, and sore while breastfeeding. The following recommendations can help keep your breasts moisturized and healthy:  Avoid using soap on your nipples.  Wear a supportive bra designed especially for nursing. Avoid wearing underwire-style bras or extremely tight bras (sports bras).  Air-dry your nipples for 3-4 minutes after each feeding.  Use only cotton bra pads to absorb leaked breast milk. Leaking of breast milk between feedings is normal.  Use lanolin   on your nipples after breastfeeding. Lanolin helps to maintain your skin's normal moisture barrier. Pure lanolin is not harmful (not toxic) to your baby. You may also hand express a few drops of breast milk and gently massage that milk into your nipples and allow the milk to air-dry.  In the first few weeks after giving birth, some women experience breast engorgement. Engorgement can make your breasts feel heavy, warm, and tender to the touch. Engorgement peaks within 3-5 days after you give birth. The following recommendations can help to ease engorgement:  Completely empty your breasts while breastfeeding or pumping. You may want to start by applying warm, moist heat (in the shower or with warm, water-soaked hand towels) just before feeding or pumping. This  increases circulation and helps the milk flow. If your baby does not completely empty your breasts while breastfeeding, pump any extra milk after he or she is finished.  Apply ice packs to your breasts immediately after breastfeeding or pumping, unless this is too uncomfortable for you. To do this: ? Put ice in a plastic bag. ? Place a towel between your skin and the bag. ? Leave the ice on for 20 minutes, 2-3 times a day.  Make sure that your baby is latched on and positioned properly while breastfeeding.  If engorgement persists after 48 hours of following these recommendations, contact your health care provider or a lactation consultant. Overall health care recommendations while breastfeeding  Eat 3 healthy meals and 3 snacks every day. Well-nourished mothers who are breastfeeding need an additional 450-500 calories a day. You can meet this requirement by increasing the amount of a balanced diet that you eat.  Drink enough water to keep your urine pale yellow or clear.  Rest often, relax, and continue to take your prenatal vitamins to prevent fatigue, stress, and low vitamin and mineral levels in your body (nutrient deficiencies).  Do not use any products that contain nicotine or tobacco, such as cigarettes and e-cigarettes. Your baby may be harmed by chemicals from cigarettes that pass into breast milk and exposure to secondhand smoke. If you need help quitting, ask your health care provider.  Avoid alcohol.  Do not use illegal drugs or marijuana.  Talk with your health care provider before taking any medicines. These include over-the-counter and prescription medicines as well as vitamins and herbal supplements. Some medicines that may be harmful to your baby can pass through breast milk.  It is possible to become pregnant while breastfeeding. If birth control is desired, ask your health care provider about options that will be safe while breastfeeding your baby. Where to find more  information: [employer]: www.llli.org Contact a health care provider if:  You feel like you want to stop breastfeeding or have become frustrated with breastfeeding.  Your nipples are cracked or bleeding.  Your breasts are red, tender, or warm.  You have: ? Painful breasts or nipples. ? A swollen area on either breast. ? A fever or chills. ? Nausea or vomiting. ? Drainage other than breast milk from your nipples.  Your breasts do not become full before feedings by the fifth day after you give birth.  You feel sad and depressed.  Your baby is: ? Too sleepy to eat well. ? Having trouble sleeping. ? More than 1 week old and wetting fewer than 6 diapers in a 24-hour period. ? Not gaining weight by 5 days of age.  Your baby has fewer than 3 stools in a 24-hour period.    Your baby's skin or the white parts of his or her eyes become yellow. Get help right away if:  Your baby is overly tired (lethargic) and does not want to wake up and feed.  Your baby develops an unexplained fever. Summary  Breastfeeding offers many health benefits for infant and mothers.  Try to breastfeed your infant when he or she shows early signs of hunger.  Gently tickle or stroke your baby's lips with your finger or nipple to allow the baby to open his or her mouth. Bring the baby to your breast. Make sure that much of the areola is in your baby's mouth. Offer one side and burp the baby before you offer the other side.  Talk with your health care provider or lactation consultant if you have questions or you face problems as you breastfeed. This information is not intended to replace advice given to you by your health care provider. Make sure you discuss any questions you have with your health care provider. Document Released: 09/05/2005 Document Revised: 10/07/2016 Document Reviewed: 10/07/2016 Elsevier Interactive Patient Education  2018 Else[employer] Inc[employer]  Trial of Labor After Cesarean  Delivery A trial of labor after cesarean delivery (TOLAC) is when a woman tries to give birth vaginally after a previous cesarean delivery. TOLAC may be a safe and appropriate option for you depending on your medical history and other risk factors. When TOLAC is successful and you are able to have a vaginal delivery, this is called a vaginal birth after cesarean delivery (VBAC). Candidates for TOLAC TOLAC is possible for some women who:  Have undergone one or two prior cesarean deliveries in which the incision of the uterus was horizontal (low transverse).  Are carrying twins and have had one prior low transverse incision during a cesarean delivery.  Do not have a vertical (classical) uterine scar.  Have not had a tear in the wall of their uterus (uterine rupture).  TOLAC is also supported for women who meet appropriate criteria and:  Are under the age of 40 years.  Are tall and have a body mass index (BMI) of less than 30.  Have an unknown uterine scar.  Give birth in a facility equipped to handle an emergency cesarean delivery. This team should be able to handle possible complications such as a uterine rupture.  Have thorough counseling about the benefits and risks of TOLAC.  Have discussed future pregnancy plans with their health care provider.  Plan to have several more pregnancies.  Most successful candidates for TOLAC:  Have had a successful vaginal delivery before or after their cesarean delivery.  Experience labor that begins naturally on or before the due date (40 weeks of gestation).  Do not have a very large (macrosomic) baby.  Had a prior cesarean delivery but are not currently experiencing factors that would prompt a cesarean delivery (such as a breech position).  Had only one prior cesarean delivery.  Had a prior cesarean delivery that was performed early in labor and not after full cervical dilation. TOLAC may be most appropriate for women who meet the above  guidelines and who plan to have more pregnancies. TOLAC is not recommended for home births. Least successful candidates for TOLAC:  Have an induced labor with an unfavorable cervix. An unfavorable cervix is when the cervix is not dilating enough (among other factors).  Have never had a vaginal delivery.  Have had more than two cesarean deliveries.  Have a pregnancy at more than 40 weeks of gestation.    Are pregnant with a baby with a suspected weight greater than 4,000 grams (8 pounds) and who have no prior history of a vaginal delivery.  Have closely spaced pregnancies. Suggested benefits of TOLAC  You may have a faster recovery time.  You may have a shorter stay in the hospital.  You may have less pain and fewer problems than with a cesarean delivery. Women who have a cesarean delivery have a higher chance of needing blood or getting a fever, an infection, or a blood clot in the legs. Suggested risks of TOLAC The highest risk of complications happens to women who attempt a TOLAC and fail. A failed TOLAC results in an unplanned cesarean delivery. Risks related to TOLAC or repeat cesarean deliveries include:  Blood loss.  Infection.  Blood clot.  Injury to surrounding tissues or organs.  Having to remove the uterus (hysterectomy).  Potential problems with the placenta (such as placenta previa or placenta accreta) in future pregnancies.  Although very rare, the main concerns with TOLAC are:  Rupture of the uterine scar from a past cesarean delivery.  Needing an emergency cesarean delivery.  Having a bad outcome for the baby (perinatal morbidity).  Where to find more information:  American Congress of Obstetricians and Gynecologists: www.acog.org  American College of Nurse-Midwives: www.midwife.org This information is not intended to replace advice given to you by your health care provider. Make sure you discuss any questions you have with your health care  provider. Document Released: 05/24/2011 Document Revised: 08/03/2016 Document Reviewed: 02/25/2013 Elsevier Interactive Patient Education  2018 Elsevier Inc.  

## 2018-05-08 LAB — CYTOLOGY - PAP
Chlamydia: NEGATIVE
Diagnosis: NEGATIVE
HPV: NOT DETECTED
Neisseria Gonorrhea: NEGATIVE

## 2018-05-10 LAB — URINE CULTURE, OB REFLEX

## 2018-05-10 LAB — CULTURE, OB URINE

## 2018-05-16 LAB — SMN1 COPY NUMBER ANALYSIS (SMA CARRIER SCREENING)

## 2018-05-16 LAB — OBSTETRIC PANEL, INCLUDING HIV
Antibody Screen: NEGATIVE
BASOS ABS: 0 10*3/uL (ref 0.0–0.2)
BASOS: 1 %
EOS (ABSOLUTE): 0 10*3/uL (ref 0.0–0.4)
EOS: 1 %
HEMATOCRIT: 39.1 % (ref 34.0–46.6)
HEMOGLOBIN: 13 g/dL (ref 11.1–15.9)
HEP B S AG: NEGATIVE
HIV Screen 4th Generation wRfx: NONREACTIVE
Immature Grans (Abs): 0.1 10*3/uL (ref 0.0–0.1)
Immature Granulocytes: 1 %
LYMPHS: 25 %
Lymphocytes Absolute: 1.4 10*3/uL (ref 0.7–3.1)
MCH: 30.7 pg (ref 26.6–33.0)
MCHC: 33.2 g/dL (ref 31.5–35.7)
MCV: 92 fL (ref 79–97)
Monocytes Absolute: 0.5 10*3/uL (ref 0.1–0.9)
Monocytes: 9 %
NEUTROS PCT: 63 %
Neutrophils Absolute: 3.7 10*3/uL (ref 1.4–7.0)
Platelets: 290 10*3/uL (ref 150–450)
RBC: 4.24 x10E6/uL (ref 3.77–5.28)
RDW: 12 % — ABNORMAL LOW (ref 12.3–15.4)
RPR Ser Ql: NONREACTIVE
Rh Factor: POSITIVE
Rubella Antibodies, IGG: <0.9 index — ABNORMAL LOW (ref >0.99)
WBC: 5.7 10*3/uL (ref 3.4–10.8)

## 2018-05-16 LAB — CYSTIC FIBROSIS MUTATION 97: GENE DIS ANAL CARRIER INTERP BLD/T-IMP: NOT DETECTED

## 2018-05-16 LAB — HEMOGLOBINOPATHY EVALUATION
HEMOGLOBIN A2 QUANTITATION: 2.3 % (ref 1.8–3.2)
HEMOGLOBIN F QUANTITATION: 0 % (ref 0.0–2.0)
HGB A: 97.7 % (ref 96.4–98.8)
HGB C: 0 %
HGB S: 0 %
HGB VARIANT: 0 %

## 2018-05-17 ENCOUNTER — Encounter: Payer: Self-pay | Admitting: Obstetrics and Gynecology

## 2018-05-17 DIAGNOSIS — Z283 Underimmunization status: Secondary | ICD-10-CM | POA: Insufficient documentation

## 2018-05-17 DIAGNOSIS — O9989 Other specified diseases and conditions complicating pregnancy, childbirth and the puerperium: Secondary | ICD-10-CM

## 2018-05-17 DIAGNOSIS — O09899 Supervision of other high risk pregnancies, unspecified trimester: Secondary | ICD-10-CM | POA: Insufficient documentation

## 2018-06-04 ENCOUNTER — Ambulatory Visit (INDEPENDENT_AMBULATORY_CARE_PROVIDER_SITE_OTHER): Payer: Medicaid Other | Admitting: Obstetrics and Gynecology

## 2018-06-04 ENCOUNTER — Encounter: Payer: Self-pay | Admitting: Obstetrics and Gynecology

## 2018-06-04 VITALS — BP 121/83 | HR 86 | Wt 139.0 lb

## 2018-06-04 DIAGNOSIS — Z2839 Other underimmunization status: Secondary | ICD-10-CM

## 2018-06-04 DIAGNOSIS — O9989 Other specified diseases and conditions complicating pregnancy, childbirth and the puerperium: Secondary | ICD-10-CM

## 2018-06-04 DIAGNOSIS — Z283 Underimmunization status: Secondary | ICD-10-CM

## 2018-06-04 DIAGNOSIS — O34219 Maternal care for unspecified type scar from previous cesarean delivery: Secondary | ICD-10-CM

## 2018-06-04 DIAGNOSIS — Z3480 Encounter for supervision of other normal pregnancy, unspecified trimester: Secondary | ICD-10-CM

## 2018-06-04 DIAGNOSIS — Z3482 Encounter for supervision of other normal pregnancy, second trimester: Secondary | ICD-10-CM

## 2018-06-04 MED ORDER — COMFORT FIT MATERNITY SUPP MED MISC
0 refills | Status: DC
Start: 2018-06-04 — End: 2018-11-07

## 2018-06-04 NOTE — Progress Notes (Signed)
   PRENATAL VISIT NOTE  Subjective:  [name] [name] is a 32 y.o. [id number] at [redacted]w[redacted]d being seen today for ongoing prenatal care.  She is currently monitored for the following issues for this low-risk pregnancy and has Vaginitis and vulvovaginitis, unspecified; left cornual; Abortion, incomplete; Supervision of other normal pregnancy, antepartum; Previous cesarean delivery affecting pregnancy; and Rubella non-immune status, antepartum on their problem list.  Patient reports backache.  Contractions: Not present. Vag. Bleeding: None.   . Denies leaking of fluid.   The following portions of the patient's history were reviewed and updated as appropriate: allergies, current medications, past family history, past medical history, past social history, past surgical history and problem list. Problem list updated.  Objective:   Vitals:   06/04/18 0851  BP: 121/83  Pulse: 86  Weight: 139 lb (63 kg)    Fetal Status: Fetal Heart Rate (bpm): + on sono         General:  Alert, oriented and cooperative. Patient is in no acute distress.  Skin: Skin is warm and dry. No rash noted.   Cardiovascular: Normal heart rate noted  Respiratory: Normal respiratory effort, no problems with respiration noted  Abdomen: Soft, gravid, appropriate for gestational age.  Pain/Pressure: Present     Pelvic: Cervical exam deferred        Extremities: Normal range of motion.     Mental Status: Normal mood and affect. Normal behavior. Normal judgment and thought content.   Assessment and Plan:  Pregnancy: [id number] at [redacted]w[redacted]d  1. Supervision of other normal pregnancy, antepartum Patient is doing well without complaints Rx for maternity support belt provided AFP today  2. Rubella non-immune status, antepartum Will offer pp  3. Previous cesarean delivery affecting pregnancy   Preterm labor symptoms and general obstetric precautions including but not limited to vaginal bleeding, contractions, leaking of fluid and fetal  movement were reviewed in detail with the patient. Please refer to After Visit Summary for other counseling recommendations.  Return in about 4 weeks (around 07/02/2018) for ROB.  No future appointments.  Catalina AntiguaPeggy Bentzion Dauria, MD

## 2018-06-06 LAB — AFP, SERUM, OPEN SPINA BIFIDA
AFP MoM: 1.39
AFP Value: 46.8 ng/mL
GEST. AGE ON COLLECTION DATE: 15.2 wk
MATERNAL AGE AT EDD: 35 a
OSBR Risk 1 IN: 7407
Test Results:: NEGATIVE
WEIGHT: 139 [lb_av]

## 2018-06-26 ENCOUNTER — Ambulatory Visit (HOSPITAL_COMMUNITY)
Admission: RE | Admit: 2018-06-26 | Discharge: 2018-06-26 | Disposition: A | Discharge status: Home / Self Care | Payer: Medicaid Other | Source: Ambulatory Visit | Attending: Obstetrics and Gynecology | Admitting: Obstetrics and Gynecology

## 2018-06-26 ENCOUNTER — Other Ambulatory Visit: Payer: Self-pay | Admitting: Obstetrics and Gynecology

## 2018-06-26 DIAGNOSIS — O34219 Maternal care for unspecified type scar from previous cesarean delivery: Secondary | ICD-10-CM

## 2018-06-26 DIAGNOSIS — Z363 Encounter for antenatal screening for malformations: Secondary | ICD-10-CM | POA: Diagnosis present

## 2018-06-26 DIAGNOSIS — Z3480 Encounter for supervision of other normal pregnancy, unspecified trimester: Secondary | ICD-10-CM

## 2018-06-26 DIAGNOSIS — Z3A18 18 weeks gestation of pregnancy: Secondary | ICD-10-CM | POA: Insufficient documentation

## 2018-06-26 DIAGNOSIS — O09522 Supervision of elderly multigravida, second trimester: Secondary | ICD-10-CM

## 2018-07-02 ENCOUNTER — Ambulatory Visit (INDEPENDENT_AMBULATORY_CARE_PROVIDER_SITE_OTHER): Payer: Medicaid Other | Admitting: Advanced Practice Midwife

## 2018-07-02 VITALS — BP 115/72 | HR 92 | Wt 142.6 lb

## 2018-07-02 DIAGNOSIS — O26892 Other specified pregnancy related conditions, second trimester: Secondary | ICD-10-CM

## 2018-07-02 DIAGNOSIS — R12 Heartburn: Secondary | ICD-10-CM

## 2018-07-02 DIAGNOSIS — Z3480 Encounter for supervision of other normal pregnancy, unspecified trimester: Secondary | ICD-10-CM

## 2018-07-02 DIAGNOSIS — O34219 Maternal care for unspecified type scar from previous cesarean delivery: Secondary | ICD-10-CM

## 2018-07-02 MED ORDER — RANITIDINE HCL 150 MG PO TABS: 150.0000 mg | ORAL_TABLET | Freq: Two times a day (BID) | ORAL | 5 refills | Status: DC

## 2018-07-02 NOTE — Patient Instructions (Addendum)
Heartburn During Pregnancy °Heartburn is a type of pain or discomfort that can happen in the throat or chest. It is often described as a burning sensation. Heartburn is common during pregnancy because: °· A hormone (progesterone) that is released during pregnancy may relax the valve (lower esophageal sphincter, or LES) that separates the esophagus from the stomach. This allows stomach acid to move up into the esophagus, causing heartburn. °· The uterus gets larger and pushes up on the stomach, which pushes more acid into the esophagus. This is especially true in the later stages of pregnancy. ° °Heartburn usually goes away or gets better after giving birth. °What are the causes? °Heartburn is caused by stomach acid backing up into the esophagus (reflux). Reflux can be triggered by: °· Changing hormone levels. °· Large meals. °· Certain foods and beverages, such as coffee, chocolate, onions, and peppermint. °· Exercise. °· Increased stomach acid production. ° °What increases the risk? °You are more likely to experience heartburn during pregnancy if you: °· Had heartburn prior to becoming pregnant. °· Have been pregnant more than once before. °· Are overweight or obese. ° °The likelihood that you will get heartburn also increases as you get farther along in your pregnancy, especially during the last trimester. °What are the signs or symptoms? °Symptoms of this condition include: °· Burning pain in the chest or lower throat. °· Bitter taste in the mouth. °· Coughing. °· Problems swallowing. °· Vomiting. °· Hoarse voice. °· Asthma. ° °Symptoms may get worse when you lie down or bend over. Symptoms are often worse at night. °How is this diagnosed? °This condition is diagnosed based on: °· Your medical history. °· Your symptoms. °· Blood tests to check for a certain type of bacteria associated with heartburn. °· Whether taking heartburn medicine relieves your symptoms. °· Examination of the stomach and esophagus using a  tube with a light and camera on the end (endoscopy). ° °How is this treated? °Treatment varies depending on how severe your symptoms are. Your health care provider may recommend: °· Over-the-counter medicines (antacids or acid reducers) for mild heartburn. °· Prescription medicines to decrease stomach acid or to protect your stomach lining. °· Certain changes in your diet. °· Raising the head of your bed so it is higher than the foot of the bed. This helps prevent stomach acid from backing up into the esophagus when you are lying down. ° °Follow these instructions at home: °Eating and drinking °· Do not drink alcohol during your pregnancy. °· Identify foods and beverages that make your symptoms worse, and avoid them. °· Beverages that you may want to avoid include: °? Coffee and tea (with or without caffeine). °? Energy drinks and sports drinks. °? Carbonated drinks or sodas. °? Citrus fruit juices. °· Foods that you may want to avoid include: °? Chocolate and cocoa. °? Peppermint and mint flavorings. °? Garlic, onions, and horseradish. °? Spicy and acidic foods, including peppers, chili powder, curry powder, vinegar, hot sauces, and barbecue sauce. °? Citrus fruits, such as oranges, lemons, and limes. °? Tomato-based foods, such as red sauce, chili, and salsa. °? Fried and fatty foods, such as donuts, french fries, potato chips, and high-fat dressings. °? High-fat meats, such as hot dogs, cold cuts, sausage, ham, and bacon. °? High-fat dairy items, such as whole milk, butter, and cheese. °· Eat small, frequent meals instead of large meals. °· Avoid drinking large amounts of liquid with your meals. °· Avoid eating meals during the 2-3 hours before   bedtime. °· Avoid lying down right after you eat. °· Do not exercise right after you eat. °Medicines °· Take over-the-counter and prescription medicines only as told by your health care provider. °· Do not take aspirin, ibuprofen, or other NSAIDs unless your health care  provider tells you to do that. °· You may be instructed to avoid medicines that contain sodium bicarbonate. °General instructions °· If directed, raise the head of your bed about 6 inches (15 cm) by putting blocks under the legs. Sleeping with more pillows does not effectively relieve heartburn because it only changes the position of your head. °· Do not use any products that contain nicotine or tobacco, such as cigarettes and e-cigarettes. If you need help quitting, ask your health care provider. °· Wear loose-fitting clothing. °· Try to reduce your stress, such as with yoga or meditation. If you need help managing stress, ask your health care provider. °· Maintain a healthy weight. If you are overweight, work with your health care provider to safely lose weight. °· Keep all follow-up visits as told by your health care provider. This is important. °Contact a health care provider if: °· You develop new symptoms. °· Your symptoms do not improve with treatment. °· You have unexplained weight loss. °· You have difficulty swallowing. °· You make loud sounds when you breathe (wheeze). °· You have a cough that does not go away. °· You have frequent heartburn for more than 2 weeks. °· You have nausea or vomiting that does not get better with treatment. °· You have pain in your abdomen. °Get help right away if: °· You have severe chest pain that spreads to your arm, neck, or jaw. °· You feel sweaty, dizzy, or light-headed. °· You have shortness of breath. °· You have pain when swallowing. °· You vomit, and your vomit looks like blood or coffee grounds. °· Your stool is bloody or black. °This information is not intended to replace advice given to you by your health care provider. Make sure you discuss any questions you have with your health care provider. °Document Released: 09/02/2000 Document Revised: 05/23/2016 Document Reviewed: 05/23/2016 °Elsevier Interactive Patient Education © 2018 Elsevier Inc. ° °

## 2018-07-02 NOTE — Progress Notes (Signed)
Pt is here for ROB [redacted]w[redacted]d.

## 2018-07-02 NOTE — Progress Notes (Signed)
   PRENATAL VISIT NOTE  Subjective:  [name] [name] is a 32 y.o. [id number] at [redacted]w[redacted]d being seen today for ongoing prenatal care.  She is currently monitored for the following issues for this low-risk pregnancy and has Vaginitis and vulvovaginitis, unspecified; left cornual; Abortion, incomplete; Supervision of other normal pregnancy, antepartum; Previous cesarean delivery affecting pregnancy; and Rubella non-immune status, antepartum on their problem list.  Patient reports abdominal soreness only when palpated in upper right abdomen, no cramping or sharp pain, and daily heartburn.  Contractions: Not present. Vag. Bleeding: None.   . Denies leaking of fluid.   The following portions of the patient's history were reviewed and updated as appropriate: allergies, current medications, past family history, past medical history, past social history, past surgical history and problem list. Problem list updated.  Objective:   Vitals:   07/02/18 0826  BP: 115/72  Pulse: 92  Weight: 64.7 kg    Fetal Status: Fetal Heart Rate (bpm): 142         General:  Alert, oriented and cooperative. Patient is in no acute distress.  Skin: Skin is warm and dry. No rash noted.   Cardiovascular: Normal heart rate noted  Respiratory: Normal respiratory effort, no problems with respiration noted  Abdomen: Soft, gravid, appropriate for gestational age.  Pain/Pressure: Present     Pelvic: Cervical exam deferred        Extremities: Normal range of motion.  Edema: None  Mental Status: Normal mood and affect. Normal behavior. Normal judgment and thought content.   Assessment and Plan:  Pregnancy: [id number] at [redacted]w[redacted]d  1. Supervision of other normal pregnancy, antepartum --Anticipatory guidance about next visits/weeks of pregnancy given.  2. Heartburn during pregnancy in second trimester --Discussed dietary/lifestyle changes, walk/sit up after meals, increase PO fluids. Small frequent meals, avoid spicy or fried foods. -  ranitidine (ZANTAC) 150 MG tablet; Take 1 tablet (150 mg total) by mouth 2 (two) times daily.  Dispense: 60 tablet; Refill: 5  3. Previous cesarean delivery affecting pregnancy --Desires repeat. Does not want delivery on 2/29 because of leap year, wants to wait until 39.1 or 39.2 to avoid.  Preterm labor symptoms and general obstetric precautions including but not limited to vaginal bleeding, contractions, leaking of fluid and fetal movement were reviewed in detail with the patient. Please refer to After Visit Summary for other counseling recommendations.  Return in about 4 weeks (around 07/30/2018).  No future appointments.  [name], CNM

## 2018-07-24 ENCOUNTER — Encounter (HOSPITAL_COMMUNITY): Payer: Self-pay | Admitting: *Deleted

## 2018-07-24 ENCOUNTER — Telehealth: Payer: Self-pay

## 2018-07-24 ENCOUNTER — Inpatient Hospital Stay (HOSPITAL_COMMUNITY)
Admission: AD | Admit: 2018-07-24 | Discharge: 2018-07-24 | Disposition: A | Discharge status: Home / Self Care | Payer: Medicaid Other | Source: Ambulatory Visit | Attending: Obstetrics and Gynecology | Admitting: Obstetrics and Gynecology

## 2018-07-24 ENCOUNTER — Other Ambulatory Visit: Payer: Self-pay

## 2018-07-24 DIAGNOSIS — B069 Rubella without complication: Secondary | ICD-10-CM | POA: Insufficient documentation

## 2018-07-24 DIAGNOSIS — Z283 Underimmunization status: Secondary | ICD-10-CM

## 2018-07-24 DIAGNOSIS — R0602 Shortness of breath: Secondary | ICD-10-CM | POA: Diagnosis not present

## 2018-07-24 DIAGNOSIS — R42 Dizziness and giddiness: Secondary | ICD-10-CM

## 2018-07-24 DIAGNOSIS — O09899 Supervision of other high risk pregnancies, unspecified trimester: Secondary | ICD-10-CM

## 2018-07-24 DIAGNOSIS — O34219 Maternal care for unspecified type scar from previous cesarean delivery: Secondary | ICD-10-CM | POA: Diagnosis not present

## 2018-07-24 DIAGNOSIS — O9989 Other specified diseases and conditions complicating pregnancy, childbirth and the puerperium: Secondary | ICD-10-CM

## 2018-07-24 DIAGNOSIS — Z3A22 22 weeks gestation of pregnancy: Secondary | ICD-10-CM | POA: Diagnosis not present

## 2018-07-24 DIAGNOSIS — O26892 Other specified pregnancy related conditions, second trimester: Secondary | ICD-10-CM | POA: Diagnosis present

## 2018-07-24 DIAGNOSIS — Z3480 Encounter for supervision of other normal pregnancy, unspecified trimester: Secondary | ICD-10-CM

## 2018-07-24 LAB — URINALYSIS, ROUTINE W REFLEX MICROSCOPIC
Bilirubin Urine: NEGATIVE
Glucose, UA: NEGATIVE mg/dL
Hgb urine dipstick: NEGATIVE
KETONES UR: NEGATIVE mg/dL
LEUKOCYTES UA: NEGATIVE
NITRITE: NEGATIVE
PH: 7 (ref 5.0–8.0)
Protein, ur: NEGATIVE mg/dL
Specific Gravity, Urine: 1.018 (ref 1.005–1.030)

## 2018-07-24 LAB — COMPREHENSIVE METABOLIC PANEL
ALT: 17 U/L (ref 0–44)
AST: 16 U/L (ref 15–41)
Albumin: 3.1 g/dL — ABNORMAL LOW (ref 3.5–5.0)
Alkaline Phosphatase: 62 U/L (ref 38–126)
Anion gap: 8 (ref 5–15)
BUN: 10 mg/dL (ref 6–20)
CHLORIDE: 103 mmol/L (ref 98–111)
CO2: 23 mmol/L (ref 22–32)
CREATININE: 0.49 mg/dL (ref 0.44–1.00)
Calcium: 8.8 mg/dL — ABNORMAL LOW (ref 8.9–10.3)
GFR calc Af Amer: >60 mL/min (ref >60)
Glucose, Bld: 88 mg/dL (ref 70–99)
Potassium: 3.5 mmol/L (ref 3.5–5.1)
SODIUM: 134 mmol/L — AB (ref 135–145)
Total Bilirubin: 0.6 mg/dL (ref 0.3–1.2)
Total Protein: 6.7 g/dL (ref 6.5–8.1)

## 2018-07-24 LAB — TSH: TSH: 1.308 u[IU]/mL (ref 0.350–4.500)

## 2018-07-24 LAB — CBC
HCT: 35.3 % — ABNORMAL LOW (ref 36.0–46.0)
HEMOGLOBIN: 11.5 g/dL — AB (ref 12.0–15.0)
MCH: 30.2 pg (ref 26.0–34.0)
MCHC: 32.6 g/dL (ref 30.0–36.0)
MCV: 92.7 fL (ref 80.0–100.0)
PLATELETS: 272 10*3/uL (ref 150–400)
RBC: 3.81 MIL/uL — AB (ref 3.87–5.11)
RDW: 13.1 % (ref 11.5–15.5)
WBC: 9.6 10*3/uL (ref 4.0–10.5)
nRBC: 0 % (ref 0.0–0.2)

## 2018-07-24 NOTE — Telephone Encounter (Signed)
Return call to patient c/o SOB, trouble breathing  and feeling faint with dizziness pt notes at times she did get very hot. Pt can only breath better if she get down on her knees "dog position" and try to take frequent breaks at work .  +FM Pt denies chest pain, no heart palpitations heart burn here and there at times.  Denies any vaginal bleeding or leaking of fluids and no swelling  I consulted w/ provider in office and pt advised to report to [facility]. Pt agreeable and voiced understanding.  Next OB appt in office is 07/30/18 w/ CNM

## 2018-07-24 NOTE — MAU Provider Note (Signed)
Chief Complaint: Shortness of Breath and Dizziness  First Provider Initiated Contact with Patient 07/24/18 1316     SUBJECTIVE HPI: [name] [name] is a 32 y.o. [id number] at [redacted]w[redacted]d who was sent to Maternity Admissions for worsening dizziness and SOB over the past month. Called [facility] and they suggested she come to MAU for eval. Hx mild SAB at the end of previous pregnancies, ut not this early and not this severe. No Hx cardiac or pulmonary problems.    Duration: 1 month Context: [redacted] weeks gestation Timing: intermittent  Modifying factors: Improves with getting on all-fours. Helps her feel like she "relieves the pressure" on her lungs Associated signs and symptoms: Neg for fever, chills, cough, wheezing, palpitations  Past Medical History:  Diagnosis Date  . Gonorrhea    OB History  Gravida Para Term Preterm AB Living  11 4 4  0 6 4  SAB TAB Ectopic Multiple Live Births  1 4 1  0 4    # Outcome Date GA Lbr Len/2nd Weight Sex Delivery Anes PTL Lv  11 Current           10 Ectopic 06/19/16 [redacted]w[redacted]d    ECTOPIC     9 Term 05/09/12 [redacted]w[redacted]d / 10:58 4460 g M CS-LVertical EPI  LIV     Birth Comments: WNL   8 TAB 2010          7 TAB 2009          6 TAB 2009          5 TAB 2008          4 Term 2007 [redacted]w[redacted]d 03:00 2920 g F Vag-Spont EPI  LIV  3 Term 2005 [redacted]w[redacted]d 06:00 2410 g M Vag-Spont EPI  LIV  2 Term 2003 [redacted]w[redacted]d 23:00 3629 g F Vag-Spont EPI  LIV  1 SAB 2002            Obstetric Comments  SAB x 1. Ectopic x 1. TAB x 4.    Past Surgical History:  Procedure Laterality Date  . CESAREAN SECTION  05/09/2012   Procedure: CESAREAN SECTION;  Surgeon: Antionette Char, MD;  Location: [facility];  Service: Gynecology;  Laterality: N/A;  . WISDOM TOOTH EXTRACTION     Social History   Socioeconomic History  . Marital status: Single    Spouse name: Not on file  . Number of children: Not on file  . Years of education: Not on file  . Highest education level: Not on file  Occupational History  . Not on file   Social Needs  . Financial resource strain: Not hard at all  . Food insecurity:    Worry: Never true    Inability: Never true  . Transportation needs:    Medical: No    Non-medical: No  Tobacco Use  . Smoking status: Never Smoker  . Smokeless tobacco: Never Used  Substance and Sexual Activity  . Alcohol use: No  . Drug use: No  . Sexual activity: Yes    Birth control/protection: Other-see comments    Comment: pregnant  Lifestyle  . Physical activity:    Days per week: Not on file    Minutes per session: Not on file  . Stress: To some extent  Relationships  . Social connections:    Talks on phone: Not on file    Gets together: Not on file    Attends religious service: Not on file    Active member of club or organization: Not on file  Attends meetings of clubs or organizations: Not on file    Relationship status: Not on file  . Intimate partner violence:    Fear of current or ex partner: No    Emotionally abused: No    Physically abused: No    Forced sexual activity: No  Other Topics Concern  . Not on file  Social History Narrative  . Not on file   Family History  Problem Relation Age of Onset  . Heart disease Mother   . Cancer Father   . Anesthesia problems Neg Hx   . Other Neg Hx    No current facility-administered medications on file prior to encounter.    Current Outpatient Medications on File Prior to Encounter  Medication Sig Dispense Refill  . albuterol (PROVENTIL HFA;VENTOLIN HFA) 108 (90 Base) MCG/ACT inhaler Inhale 2 puffs into the lungs every 6 (six) hours as needed for wheezing or shortness of breath. (Patient not taking: Reported on 05/07/2018) 1 Inhaler 2  . benzonatate (TESSALON) 100 MG capsule Take 1 capsule (100 mg total) by mouth every 8 (eight) hours. (Patient not taking: Reported on 05/07/2018) 21 capsule 0  . Doxylamine-Pyridoxine 10-10 MG TBEC Take 2 tablets by mouth at bedtime. (Patient not taking: Reported on 07/02/2018) 60 tablet 1  .  Elastic Bandages & Supports (COMFORT FIT MATERNITY SUPP MED) MISC Wear daily when ambulating (Patient not taking: Reported on 07/02/2018) 1 each 0  . Prenat-Fe Poly-Methfol-FA-DHA (VITAFOL ULTRA) 29-0.6-0.4-200 MG CAPS Take 1 capsule by mouth daily before breakfast. 90 capsule 4  . promethazine (PHENERGAN) 12.5 MG tablet Take 1 tablet (12.5 mg total) by mouth every 6 (six) hours as needed for nausea or vomiting. (Patient not taking: Reported on 07/02/2018) 30 tablet 0  . ranitidine (ZANTAC) 150 MG tablet Take 1 tablet (150 mg total) by mouth 2 (two) times daily. 60 tablet 5   Allergies  Allergen Reactions  . Oseltamivir Phosphate Rash    I have reviewed patient's Past Medical Hx, Surgical Hx, Family Hx, Social Hx, medications and allergies.   Review of Systems  Constitutional: Negative for chills and fever.  Respiratory: Positive for shortness of breath. Negative for cough, chest tightness and wheezing.   Cardiovascular: Negative for chest pain and leg swelling.  Gastrointestinal: Negative for abdominal pain.  Genitourinary: Negative for vaginal bleeding.  Neurological: Positive for dizziness.    OBJECTIVE Patient Vitals for the past 24 hrs:  BP Temp Temp src Pulse Resp SpO2 Weight  07/24/18 1548 107/70 - - 91 16 100 % -  07/24/18 1455 - - - - - 97 % -  07/24/18 1440 - - - - - 98 % -  07/24/18 1355 - - - - - 99 % -  07/24/18 1325 - - - - - 99 % -  07/24/18 1315 - - - - - 97 % -  07/24/18 1305 - - - - - 100 % -  07/24/18 1300 - - - 93 20 100 % -  07/24/18 1242 122/74 98.4 F (36.9 C) Oral 92 (!) 22 100 % 67.7 kg  Continuous pulse-ox 90's   Constitutional: Well-developed, well-nourished female in no acute distress.  Skin: No Cyanosis Cardiovascular: normal rate and rhythm.  No M/R/G Respiratory: normal rate and effort. Lungs CTAB GI: Abd soft, non-tender, gravid appropriate for gestational age.  MS: Extremities nontender, no edema, normal ROM. Neg Homan's Neurologic: Alert and  oriented x 4.  GU: deferred  FHR 156 by doppler  LAB RESULTS Results for orders  placed or performed during the hospital encounter of 07/24/18 (from the past 24 hour(s))  Urinalysis, Routine w reflex microscopic     Status: Abnormal   Collection Time: 07/24/18 12:53 PM  Result Value Ref Range   Color, Urine YELLOW YELLOW   APPearance CLOUDY (A) CLEAR   Specific Gravity, Urine 1.018 1.005 - 1.030   pH 7.0 5.0 - 8.0   Glucose, UA NEGATIVE NEGATIVE mg/dL   Hgb urine dipstick NEGATIVE NEGATIVE   Bilirubin Urine NEGATIVE NEGATIVE   Ketones, ur NEGATIVE NEGATIVE mg/dL   Protein, ur NEGATIVE NEGATIVE mg/dL   Nitrite NEGATIVE NEGATIVE   Leukocytes, UA NEGATIVE NEGATIVE  CBC     Status: Abnormal   Collection Time: 07/24/18  1:16 PM  Result Value Ref Range   WBC 9.6 4.0 - 10.5 K/uL   RBC 3.81 (L) 3.87 - 5.11 MIL/uL   Hemoglobin 11.5 (L) 12.0 - 15.0 g/dL   HCT 16.1 (L) 09.6 - 04.5 %   MCV 92.7 80.0 - 100.0 fL   MCH 30.2 26.0 - 34.0 pg   MCHC 32.6 30.0 - 36.0 g/dL   RDW 40.9 81.1 - 91.4 %   Platelets 272 150 - 400 K/uL   nRBC 0.0 0.0 - 0.2 %  Comprehensive metabolic panel     Status: Abnormal   Collection Time: 07/24/18  1:16 PM  Result Value Ref Range   Sodium 134 (L) 135 - 145 mmol/L   Potassium 3.5 3.5 - 5.1 mmol/L   Chloride 103 98 - 111 mmol/L   CO2 23 22 - 32 mmol/L   Glucose, Bld 88 70 - 99 mg/dL   BUN 10 6 - 20 mg/dL   Creatinine, Ser 7.82 0.44 - 1.00 mg/dL   Calcium 8.8 (L) 8.9 - 10.3 mg/dL   Total Protein 6.7 6.5 - 8.1 g/dL   Albumin 3.1 (L) 3.5 - 5.0 g/dL   AST 16 15 - 41 U/L   ALT 17 0 - 44 U/L   Alkaline Phosphatase 62 38 - 126 U/L   Total Bilirubin 0.6 0.3 - 1.2 mg/dL   GFR calc non Af Amer >60 >60 mL/min   GFR calc Af Amer >60 >60 mL/min   Anion gap 8 5 - 15  TSH     Status: None   Collection Time: 07/24/18  1:16 PM  Result Value Ref Range   TSH 1.308 0.350 - 4.500 uIU/mL    IMAGING NA  MAU COURSE Orders Placed This Encounter  Procedures  .  Urinalysis, Routine w reflex microscopic  . CBC  . Comprehensive metabolic panel  . TSH  . EKG 12-Lead  . Discharge patient   No orders of the defined types were placed in this encounter.   No episodes of SOB of dizziness in MAU.   MDM - Episodes of dizziness and SOB of unknown etiology. Nml EKG, TSH, K. No dehydration. Low suspicion for  PE w/ intermittent Sx, no tachycardia, no risks factors other then pregnancy. Plan OP cardiology work-up for possible Holter monitoring. Discussed Hx, labs, exam w/ Dr. Earlene Plater who agrees w/ POC.   ASSESSMENT 1. Shortness of breath due to pregnancy in second trimester   2. Rubella non-immune status, antepartum   3. Supervision of other normal pregnancy, antepartum   4. Previous cesarean delivery affecting pregnancy   5. Dizziness     PLAN Discharge home in stable condition. Dizziness precautions Follow-up Information    [facility] Follow up on 07/30/2018.   Why:  as scheduled Contact information: [address] [place] [address] [phone]       [facility] EMERGENCY DEPARTMENT Follow up.   Specialty:  Emergency Medicine Why:  as needed in emergencies Contact information: [address] [id number] mc [place] [address] [phone]         Allergies as of 07/24/2018      Reactions   Oseltamivir Phosphate Rash      Medication List    STOP taking these medications   benzonatate 100 MG capsule Commonly known as:  TESSALON   promethazine 12.5 MG tablet Commonly known as:  PHENERGAN     TAKE these medications   albuterol 108 (90 Base) MCG/ACT inhaler Commonly known as:  PROVENTIL HFA;VENTOLIN HFA Inhale 2 puffs into the lungs every 6 (six) hours as needed for wheezing or shortness of breath.   COMFORT FIT MATERNITY SUPP MED Misc Wear daily when ambulating   Doxylamine-Pyridoxine 10-10 MG Tbec Take 2 tablets by mouth at bedtime.    ranitidine 150 MG tablet Commonly known as:  ZANTAC Take 1 tablet (150 mg total) by mouth 2 (two) times daily.   VITAFOL ULTRA 29-0.6-0.4-200 MG Caps Take 1 capsule by mouth daily before breakfast.        Katrinka Blazing, [place], CNM 07/24/2018  3:31 PM

## 2018-07-24 NOTE — MAU Note (Signed)
Got sent over here from Dr Clearance Coots.  Feeling SOB and when it happens, she gets dizzy - feels like she is going to pass out. .been going on for a month.  Feeling a lot of pressure in upper abd, feels like she can't get a breath, bad at night when she is laying down.

## 2018-07-24 NOTE — Discharge Instructions (Signed)
Shortness of Breath, Adult Shortness of breath is when a person has trouble breathing enough air, or when a person feels like she or he is having trouble breathing in enough air. Shortness of breath could be a sign of medical problem. Follow these instructions at home: Pay attention to any changes in your symptoms. Take these actions to help with your condition:  Do not smoke. Smoking is a common cause of shortness of breath. If you smoke and you need help quitting, ask your health care provider.  Avoid things that can irritate your airways, such as: ? Mold. ? Dust. ? Air pollution. ? Chemical fumes. ? Things that can cause allergy symptoms (allergens), if you have allergies.  Keep your living space clean and free of mold and dust.  Rest as needed. Slowly return to your usual activities.  Take over-the-counter and prescription medicines, including oxygen and inhaled medicines, only as told by your health care provider.  Keep all follow-up visits as told by your health care provider. This is important.  Contact a health care provider if:  Your condition does not improve as soon as expected.  You have a hard time doing your normal activities, even after you rest.  You have new symptoms. Get help right away if:  Your shortness of breath gets worse.  You have shortness of breath when you are resting.  You feel light-headed or you faint.  You have a cough that is not controlled with medicines.  You cough up blood.  You have pain with breathing.  You have pain in your chest, arms, shoulders, or abdomen.  You have a fever.  You cannot walk up stairs or exercise the way that you normally do. This information is not intended to replace advice given to you by your health care provider. Make sure you discuss any questions you have with your health care provider. Document Released: 05/31/2001 Document Revised: 03/26/2016 Document Reviewed: 02/11/2016 Elsevier Interactive Patient  Education  2018 Elsevier Inc.  

## 2018-07-25 ENCOUNTER — Other Ambulatory Visit: Payer: Self-pay | Admitting: *Deleted

## 2018-07-25 DIAGNOSIS — R0602 Shortness of breath: Principal | ICD-10-CM

## 2018-07-25 DIAGNOSIS — R42 Dizziness and giddiness: Secondary | ICD-10-CM

## 2018-07-25 DIAGNOSIS — O26892 Other specified pregnancy related conditions, second trimester: Secondary | ICD-10-CM

## 2018-07-30 ENCOUNTER — Ambulatory Visit (INDEPENDENT_AMBULATORY_CARE_PROVIDER_SITE_OTHER): Payer: Medicaid Other | Admitting: Advanced Practice Midwife

## 2018-07-30 ENCOUNTER — Encounter: Payer: Self-pay | Admitting: Advanced Practice Midwife

## 2018-07-30 VITALS — Wt 147.0 lb

## 2018-07-30 DIAGNOSIS — O26892 Other specified pregnancy related conditions, second trimester: Secondary | ICD-10-CM

## 2018-07-30 DIAGNOSIS — R0789 Other chest pain: Secondary | ICD-10-CM

## 2018-07-30 DIAGNOSIS — O34219 Maternal care for unspecified type scar from previous cesarean delivery: Secondary | ICD-10-CM

## 2018-07-30 DIAGNOSIS — R0602 Shortness of breath: Secondary | ICD-10-CM

## 2018-07-30 DIAGNOSIS — R42 Dizziness and giddiness: Secondary | ICD-10-CM

## 2018-07-30 DIAGNOSIS — Z3480 Encounter for supervision of other normal pregnancy, unspecified trimester: Secondary | ICD-10-CM

## 2018-07-30 DIAGNOSIS — Z3482 Encounter for supervision of other normal pregnancy, second trimester: Secondary | ICD-10-CM

## 2018-07-30 MED ORDER — ALBUTEROL SULFATE HFA 108 (90 BASE) MCG/ACT IN AERS: 2.0000 | INHALATION_SPRAY | Freq: Four times a day (QID) | RESPIRATORY_TRACT | 2 refills | Status: DC | PRN

## 2018-07-30 NOTE — Progress Notes (Signed)
Cardiology Office Note   Date:  07/30/2018   ID:  [name], DOB [birth year], MRN [id number]  PCP:  Patient, No Pcp Per  Cardiologist:   Charlton Haws, MD   No chief complaint on file.     History of Present Illness: [name] [name] is a 32 y.o. female who presents for consultation regarding dizziness and dyspnea Referred by [name] MAU nurse  She is in her 2nd trimester of pregnancy [id number].  About 23 weeks currentlyReviewed office note from prenatal visit 07/29/18 and complained about intermittent dyspneaAnd chest pressure. Seen in MAU 11/5 with same and had normal ECG Feels like something sitting on her chest and cannot take a deep breath. Not positional Feels like her upper abdomen is in her chest has had previous C/S and will deliver this way again No previous cardiac issues or post partum DCM   Labs from 07/24/18 reviewed Hct 35.3 TSH 1.3   Has gained more weight this time than previously     Past Medical History:  Diagnosis Date  . Gonorrhea     Past Surgical History:  Procedure Laterality Date  . CESAREAN SECTION  05/09/2012   Procedure: CESAREAN SECTION;  Surgeon: Antionette Char, MD;  Location: [facility];  Service: Gynecology;  Laterality: N/A;  . WISDOM TOOTH EXTRACTION       Current Outpatient Medications  Medication Sig Dispense Refill  . albuterol (PROVENTIL HFA;VENTOLIN HFA) 108 (90 Base) MCG/ACT inhaler Inhale 2 puffs into the lungs every 6 (six) hours as needed for wheezing or shortness of breath. 1 Inhaler 2  . Doxylamine-Pyridoxine 10-10 MG TBEC Take 2 tablets by mouth at bedtime. (Patient not taking: Reported on 07/02/2018) 60 tablet 1  . Elastic Bandages & Supports (COMFORT FIT MATERNITY SUPP MED) MISC Wear daily when ambulating (Patient not taking: Reported on 07/02/2018) 1 each 0  . Prenat-Fe Poly-Methfol-FA-DHA (VITAFOL ULTRA) 29-0.6-0.4-200 MG CAPS Take 1 capsule by mouth daily before breakfast. 90 capsule 4  . ranitidine (ZANTAC) 150  MG tablet Take 1 tablet (150 mg total) by mouth 2 (two) times daily. 60 tablet 5   No current facility-administered medications for this visit.     Allergies:   Oseltamivir phosphate    Social History:  The patient  reports that she has never smoked. She has never used smokeless tobacco. She reports that she does not drink alcohol or use drugs.   Family History:  The patient's family history includes Cancer in her father; Heart disease in her mother.    ROS:  Please see the history of present illness.   Otherwise, review of systems are positive for none.   All other systems are reviewed and negative.    PHYSICAL EXAM: VS:  LMP 02/17/2018  , BMI There is no height or weight on file to calculate BMI. Affect appropriate Healthy:  appears stated age HEENT: normal Neck supple with no adenopathy JVP normal no bruits no thyromegaly Lungs clear with no wheezing and good diaphragmatic motion Heart:  S1/S2 no murmur, no rub, gallop or click PMI normal Abdomen: benighn, BS positve, no tenderness, no AAA [redacted] weeks pregnant  no bruit.  No HSM or HJR Distal pulses intact with no bruits No edema Neuro non-focal Skin warm and dry No muscular weakness    EKG:  Not done    Recent Labs: 07/24/2018: ALT 17; BUN 10; Creatinine, Ser 0.49; Hemoglobin 11.5; Platelets 272; Potassium 3.5; Sodium 134; TSH 1.308    Lipid Panel No results  found for: CHOL, TRIG, HDL, CHOLHDL, VLDL, LDLCALC, LDLDIRECT    Wt Readings from Last 3 Encounters:  07/30/18 147 lb (66.7 kg)  07/24/18 149 lb 4 oz (67.7 kg)  07/02/18 142 lb 9.6 oz (64.7 kg)      Other studies Reviewed: Additional studies/ records that were reviewed today include: Notes/ Labs OB/Gyn.    ASSESSMENT AND PLAN:  1.  Dyspnea:  Likely related to weight gain and pregnancy She is short waisted and has little room fo r The baby to grow. Will order TTE to assess RV/LV function given multiple previous pregnancies  And age 32. 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  The patient does not have concerns regarding medicines.  The following changes have been made:  no change  Labs/ tests ordered today include: TTE  No orders of the defined types were placed in this encounter.    Disposition:   FU with [place] in April      Signed, Charlton Haws, MD  07/30/2018 4:19 PM    [facility] [address] [place], [place], [place]  [address] Phone: [phone]; Fax: [phone]

## 2018-07-30 NOTE — Progress Notes (Signed)
   PRENATAL VISIT NOTE  Subjective:  [name] [name] is a 32 y.o. [id number] at [redacted]w[redacted]d being seen today for ongoing prenatal care.  She is currently monitored for the following issues for this low-risk pregnancy and has Vaginitis and vulvovaginitis, unspecified; left cornual; Abortion, incomplete; Supervision of other normal pregnancy, antepartum; Previous cesarean delivery affecting pregnancy; and Rubella non-immune status, antepartum on their problem list.  Patient reports intermittent shortness of breath and chest pressure.  Contractions: Irritability. Vag. Bleeding: None.  Movement: Present. Denies leaking of fluid.   The following portions of the patient's history were reviewed and updated as appropriate: allergies, current medications, past family history, past medical history, past social history, past surgical history and problem list. Problem list updated.  Objective:   Vitals:   07/30/18 0940  Weight: 66.7 kg    Fetal Status: Fetal Heart Rate (bpm): 156   Movement: Present     General:  Alert, oriented and cooperative. Patient is in no acute distress.  Skin: Skin is warm and dry. No rash noted.   Cardiovascular: HEART: normal rate, heart sounds, regular rhythm RESP: normal effort, lung sounds clear and equal bilaterally  Respiratory: Normal respiratory effort, no problems with respiration noted  Abdomen: Soft, gravid, appropriate for gestational age.  Pain/Pressure: Present     Pelvic: Cervical exam deferred        Extremities: Normal range of motion.  Edema: None  Mental Status: Normal mood and affect. Normal behavior. Normal judgment and thought content.   Assessment and Plan:  Pregnancy: [id number] at [redacted]w[redacted]d  1. Supervision of other normal pregnancy, antepartum --Anticipatory guidance about next visits/weeks of pregnancy given.  2. Shortness of breath due to pregnancy in second trimester --Symptoms started 1 month ago and are worsening --Pt was seen in MAU 11/5 for SOB and  chest pressure, and had normal EKG. --Note to refer pt to cardiology --Pt continues to have intermittent symptoms, feels normal sometimes then out of the blue feels like something is sitting on her chest, denies any pain with this sensation, and like she cannot take a deep breath.  No positions make it worse, sometimes lying down on her side or getting on her hands and knees makes it go away. --Heart and lung sounds normal in office today --No hx of asthma but SOB is main symptom, vs chest pain so will prescribe albuterol inhaler to see if this improves symptoms. --Placed cardiology referral today, front desk to call cardiology at check out for earliest appointment.  3. Dizziness --Intermittent, associated with SOB and chest pressure.  4. Sensation of chest pressure --Denies pain, feels like upper abdomen into lower chest with pressure.  4. Previous cesarean delivery affecting pregnancy --SVD x 3 then C/S, desires repeat C/S.  Preterm labor symptoms and general obstetric precautions including but not limited to vaginal bleeding, contractions, leaking of fluid and fetal movement were reviewed in detail with the patient. Please refer to After Visit Summary for other counseling recommendations.  No follow-ups on file.  Future Appointments  Date Time Provider Department Center  08/01/2018 10:15 AM Wendall Stade, MD [facility]  08/27/2018  8:00 AM [facility] None  08/27/2018  8:15 AM Constant, Gigi Gin, MD [facility] None    [name], CNM

## 2018-07-30 NOTE — Progress Notes (Signed)
ROB w/complaint of SOB, pain and pressure was seen at MAU for same sx's.

## 2018-07-30 NOTE — Patient Instructions (Signed)

## 2018-08-01 ENCOUNTER — Encounter: Payer: Self-pay | Admitting: Cardiovascular Disease

## 2018-08-01 ENCOUNTER — Ambulatory Visit (INDEPENDENT_AMBULATORY_CARE_PROVIDER_SITE_OTHER): Payer: Medicaid Other | Admitting: Cardiovascular Disease

## 2018-08-01 VITALS — BP 128/68 | HR 93 | Ht 59.0 in | Wt 148.5 lb

## 2018-08-01 DIAGNOSIS — R0600 Dyspnea, unspecified: Secondary | ICD-10-CM

## 2018-08-01 DIAGNOSIS — Z7689 Persons encountering health services in other specified circumstances: Secondary | ICD-10-CM | POA: Diagnosis not present

## 2018-08-01 NOTE — Patient Instructions (Signed)
Medication Instructions:   If you need a refill on your cardiac medications before your next appointment, please call your pharmacy.   Lab work:  If you have labs (blood work) drawn today and your tests are completely normal, you will receive your results only by: . MyChart Message (if you have MyChart) OR . A paper copy in the mail If you have any lab test that is abnormal or we need to change your treatment, we will call you to review the results.  Testing/Procedures: Your physician has requested that you have an echocardiogram. Echocardiography is a painless test that uses sound waves to create images of your heart. It provides your doctor with information about the size and shape of your heart and how well your heart's chambers and valves are working. This procedure takes approximately one hour. There are no restrictions for this procedure.  Follow-Up: At [facility], you and your health needs are our priority.  As part of our continuing mission to provide you with exceptional heart care, we have created designated Provider Care Teams.  These Care Teams include your primary Cardiologist (physician) and Advanced Practice Providers (APPs -  Physician Assistants and Nurse Practitioners) who all work together to provide you with the care you need, when you need it. You will need a follow up appointment in 6 months.  Please call our office 2 months in advance to schedule this appointment.  You may see Peter Nishan, MD or one of the following Advanced Practice Providers on your designated Care Team:   Lori Gerhardt, NP Laura Ingold, NP . Jill McDaniel, NP    

## 2018-08-09 ENCOUNTER — Other Ambulatory Visit: Payer: Self-pay

## 2018-08-09 ENCOUNTER — Ambulatory Visit (HOSPITAL_COMMUNITY): Payer: Medicaid Other | Attending: Cardiovascular Disease

## 2018-08-09 DIAGNOSIS — Z7689 Persons encountering health services in other specified circumstances: Secondary | ICD-10-CM | POA: Insufficient documentation

## 2018-08-09 DIAGNOSIS — R0600 Dyspnea, unspecified: Secondary | ICD-10-CM | POA: Diagnosis not present

## 2018-08-27 ENCOUNTER — Encounter: Payer: Self-pay | Admitting: Obstetrics and Gynecology

## 2018-08-27 ENCOUNTER — Other Ambulatory Visit: Payer: Medicaid Other

## 2018-08-27 ENCOUNTER — Other Ambulatory Visit: Payer: Self-pay

## 2018-08-27 ENCOUNTER — Ambulatory Visit (INDEPENDENT_AMBULATORY_CARE_PROVIDER_SITE_OTHER): Payer: Medicaid Other | Admitting: Obstetrics and Gynecology

## 2018-08-27 VITALS — BP 105/68 | HR 84 | Wt 152.0 lb

## 2018-08-27 DIAGNOSIS — O9989 Other specified diseases and conditions complicating pregnancy, childbirth and the puerperium: Secondary | ICD-10-CM

## 2018-08-27 DIAGNOSIS — Z3480 Encounter for supervision of other normal pregnancy, unspecified trimester: Secondary | ICD-10-CM

## 2018-08-27 DIAGNOSIS — Z283 Underimmunization status: Secondary | ICD-10-CM

## 2018-08-27 DIAGNOSIS — O09899 Supervision of other high risk pregnancies, unspecified trimester: Secondary | ICD-10-CM

## 2018-08-27 DIAGNOSIS — O34219 Maternal care for unspecified type scar from previous cesarean delivery: Secondary | ICD-10-CM

## 2018-08-27 DIAGNOSIS — Z3482 Encounter for supervision of other normal pregnancy, second trimester: Secondary | ICD-10-CM

## 2018-08-27 NOTE — Progress Notes (Signed)
   PRENATAL VISIT NOTE  Subjective:  [name] [name] is a 32 y.o. [id number] at 5320w2d being seen today for ongoing prenatal care.  She is currently monitored for the following issues for this low-risk pregnancy and has left cornual; Supervision of other normal pregnancy, antepartum; Previous cesarean delivery affecting pregnancy; and Rubella non-immune status, antepartum on their problem list.  Patient reports consuming cornstarch.  Contractions: Irritability. Vag. Bleeding: None.  Movement: Present. Denies leaking of fluid.   The following portions of the patient's history were reviewed and updated as appropriate: allergies, current medications, past family history, past medical history, past social history, past surgical history and problem list. Problem list updated.  Objective:   Vitals:   08/27/18 0813  BP: 105/68  Pulse: 84  Weight: 152 lb (68.9 kg)    Fetal Status: Fetal Heart Rate (bpm): 154   Movement: Present     General:  Alert, oriented and cooperative. Patient is in no acute distress.  Skin: Skin is warm and dry. No rash noted.   Cardiovascular: Normal heart rate noted  Respiratory: Normal respiratory effort, no problems with respiration noted  Abdomen: Soft, gravid, appropriate for gestational age.  Pain/Pressure: Present     Pelvic: Cervical exam deferred        Extremities: Normal range of motion.  Edema: None  Mental Status: Normal mood and affect. Normal behavior. Normal judgment and thought content.   Assessment and Plan:  Pregnancy: [id number] at 2920w2d  1. Supervision of other normal pregnancy, antepartum Patient is doing well No anemia on 11/5. Third trimester labs and glucola today Patient desires BTL- Form signed today Patient plans to stay with same pediatrician   2. Previous cesarean delivery affecting pregnancy Patient desires repeat c-section with BTL Patient will be scheduled on 3/1 or 3/2 per her request  3. Rubella non-immune status, antepartum Will  offer pp  Preterm labor symptoms and general obstetric precautions including but not limited to vaginal bleeding, contractions, leaking of fluid and fetal movement were reviewed in detail with the patient. Please refer to After Visit Summary for other counseling recommendations.  No follow-ups on file.  No future appointments.  Catalina AntiguaPeggy Rolena Knutson, MD

## 2018-08-27 NOTE — Progress Notes (Signed)
ROB/GTT.  TDAP declined. C/o craving and eating Cornstarch everyday for the past 1 1/2 months.

## 2018-08-28 LAB — CBC
HEMATOCRIT: 33.9 % — AB (ref 34.0–46.6)
HEMOGLOBIN: 11 g/dL — AB (ref 11.1–15.9)
MCH: 29 pg (ref 26.6–33.0)
MCHC: 32.4 g/dL (ref 31.5–35.7)
MCV: 89 fL (ref 79–97)
Platelets: 260 10*3/uL (ref 150–450)
RBC: 3.79 x10E6/uL (ref 3.77–5.28)
RDW: 12.6 % (ref 12.3–15.4)
WBC: 8.6 10*3/uL (ref 3.4–10.8)

## 2018-08-28 LAB — HIV ANTIBODY (ROUTINE TESTING W REFLEX): HIV Screen 4th Generation wRfx: NONREACTIVE

## 2018-08-28 LAB — GLUCOSE TOLERANCE, 2 HOURS W/ 1HR
GLUCOSE, 2 HOUR: 107 mg/dL (ref 65–152)
Glucose, 1 hour: 144 mg/dL (ref 65–179)
Glucose, Fasting: 80 mg/dL (ref 65–91)

## 2018-08-28 LAB — RPR: RPR: NONREACTIVE

## 2018-08-28 MED ORDER — FERROUS SULFATE 325 (65 FE) MG PO TABS: 325.0000 mg | ORAL_TABLET | Freq: Two times a day (BID) | ORAL | 1 refills | Status: DC

## 2018-08-28 NOTE — Addendum Note (Signed)
Addended by: Catalina AntiguaONSTANT, Kadyn Guild on: 08/28/2018 08:09 AM   Modules accepted: Orders

## 2018-09-10 ENCOUNTER — Ambulatory Visit (INDEPENDENT_AMBULATORY_CARE_PROVIDER_SITE_OTHER): Payer: Medicaid Other | Admitting: Obstetrics and Gynecology

## 2018-09-10 ENCOUNTER — Encounter: Payer: Self-pay | Admitting: Obstetrics and Gynecology

## 2018-09-10 VITALS — BP 99/65 | HR 103 | Wt 158.5 lb

## 2018-09-10 DIAGNOSIS — Z3480 Encounter for supervision of other normal pregnancy, unspecified trimester: Secondary | ICD-10-CM

## 2018-09-10 DIAGNOSIS — O09899 Supervision of other high risk pregnancies, unspecified trimester: Secondary | ICD-10-CM

## 2018-09-10 DIAGNOSIS — O34219 Maternal care for unspecified type scar from previous cesarean delivery: Secondary | ICD-10-CM

## 2018-09-10 DIAGNOSIS — O9989 Other specified diseases and conditions complicating pregnancy, childbirth and the puerperium: Secondary | ICD-10-CM

## 2018-09-10 DIAGNOSIS — Z3483 Encounter for supervision of other normal pregnancy, third trimester: Secondary | ICD-10-CM

## 2018-09-10 DIAGNOSIS — Z283 Underimmunization status: Secondary | ICD-10-CM

## 2018-09-10 NOTE — Patient Instructions (Signed)
Third Trimester of Pregnancy The third trimester is from week 28 through week 40 (months 7 through 9). The third trimester is a time when the unborn baby (fetus) is growing rapidly. At the end of the ninth month, the fetus is about 20 inches in length and weighs 6-10 pounds. Body changes during your third trimester Your body will continue to go through many changes during pregnancy. The changes vary from woman to woman. During the third trimester:  Your weight will continue to increase. You can expect to gain 25-35 pounds (11-16 kg) by the end of the pregnancy.  You may begin to get stretch marks on your hips, abdomen, and breasts.  You may urinate more often because the fetus is moving lower into your pelvis and pressing on your bladder.  You may develop or continue to have heartburn. This is caused by increased hormones that slow down muscles in the digestive tract.  You may develop or continue to have constipation because increased hormones slow digestion and cause the muscles that push waste through your intestines to relax.  You may develop hemorrhoids. These are swollen veins (varicose veins) in the rectum that can itch or be painful.  You may develop swollen, bulging veins (varicose veins) in your legs.  You may have increased body aches in the pelvis, back, or thighs. This is due to weight gain and increased hormones that are relaxing your joints.  You may have changes in your hair. These can include thickening of your hair, rapid growth, and changes in texture. Some women also have hair loss during or after pregnancy, or hair that feels dry or thin. Your hair will most likely return to normal after your baby is born.  Your breasts will continue to grow and they will continue to become tender. A yellow fluid (colostrum) may leak from your breasts. This is the first milk you are producing for your baby.  Your belly button may stick out.  You may notice more swelling in your hands,  face, or ankles.  You may have increased tingling or numbness in your hands, arms, and legs. The skin on your belly may also feel numb.  You may feel short of breath because of your expanding uterus.  You may have more problems sleeping. This can be caused by the size of your belly, increased need to urinate, and an increase in your body's metabolism.  You may notice the fetus "dropping," or moving lower in your abdomen (lightening).  You may have increased vaginal discharge.  You may notice your joints feel loose and you may have pain around your pelvic bone. What to expect at prenatal visits You will have prenatal exams every 2 weeks until week 36. Then you will have weekly prenatal exams. During a routine prenatal visit:  You will be weighed to make sure you and the baby are growing normally.  Your blood pressure will be taken.  Your abdomen will be measured to track your baby's growth.  The fetal heartbeat will be listened to.  Any test results from the previous visit will be discussed.  You may have a cervical check near your due date to see if your cervix has softened or thinned (effaced).  You will be tested for Group B streptococcus. This happens between 35 and 37 weeks. Your health care provider may ask you:  What your birth plan is.  How you are feeling.  If you are feeling the baby move.  If you have had any abnormal   symptoms, such as leaking fluid, bleeding, severe headaches, or abdominal cramping.  If you are using any tobacco products, including cigarettes, chewing tobacco, and electronic cigarettes.  If you have any questions. Other tests or screenings that may be performed during your third trimester include:  Blood tests that check for low iron levels (anemia).  Fetal testing to check the health, activity level, and growth of the fetus. Testing is done if you have certain medical conditions or if there are problems during the pregnancy.  Nonstress test  (NST). This test checks the health of your baby to make sure there are no signs of problems, such as the baby not getting enough oxygen. During this test, a belt is placed around your belly. The baby is made to move, and its heart rate is monitored during movement. What is false labor? False labor is a condition in which you feel small, irregular tightenings of the muscles in the womb (contractions) that usually go away with rest, changing position, or drinking water. These are called Braxton Hicks contractions. Contractions may last for hours, days, or even weeks before true labor sets in. If contractions come at regular intervals, become more frequent, increase in intensity, or become painful, you should see your health care provider. What are the signs of labor?  Abdominal cramps.  Regular contractions that start at 10 minutes apart and become stronger and more frequent with time.  Contractions that start on the top of the uterus and spread down to the lower abdomen and back.  Increased pelvic pressure and dull back pain.  A watery or bloody mucus discharge that comes from the vagina.  Leaking of amniotic fluid. This is also known as your "water breaking." It could be a slow trickle or a gush. Let your health care provider know if it has a color or strange odor. If you have any of these signs, call your health care provider right away, even if it is before your due date. Follow these instructions at home: Medicines  Follow your health care provider's instructions regarding medicine use. Specific medicines may be either safe or unsafe to take during pregnancy.  Take a prenatal vitamin that contains at least 600 micrograms (mcg) of folic acid.  If you develop constipation, try taking a stool softener if your health care provider approves. Eating and drinking   Eat a balanced diet that includes fresh fruits and vegetables, whole grains, good sources of protein such as meat, eggs, or tofu,  and low-fat dairy. Your health care provider will help you determine the amount of weight gain that is right for you.  Avoid raw meat and uncooked cheese. These carry germs that can cause birth defects in the baby.  If you have low calcium intake from food, talk to your health care provider about whether you should take a daily calcium supplement.  Eat four or five small meals rather than three large meals a day.  Limit foods that are high in fat and processed sugars, such as fried and sweet foods.  To prevent constipation: ? Drink enough fluid to keep your urine clear or pale yellow. ? Eat foods that are high in fiber, such as fresh fruits and vegetables, whole grains, and beans. Activity  Exercise only as directed by your health care provider. Most women can continue their usual exercise routine during pregnancy. Try to exercise for 30 minutes at least 5 days a week. Stop exercising if you experience uterine contractions.  Avoid heavy lifting.  Do   not exercise in extreme heat or humidity, or at high altitudes.  Wear low-heel, comfortable shoes.  Practice good posture.  You may continue to have sex unless your health care provider tells you otherwise. Relieving pain and discomfort  Take frequent breaks and rest with your legs elevated if you have leg cramps or low back pain.  Take warm sitz baths to soothe any pain or discomfort caused by hemorrhoids. Use hemorrhoid cream if your health care provider approves.  Wear a good support bra to prevent discomfort from breast tenderness.  If you develop varicose veins: ? Wear support pantyhose or compression stockings as told by your healthcare provider. ? Elevate your feet for 15 minutes, 3-4 times a day. Prenatal care  Write down your questions. Take them to your prenatal visits.  Keep all your prenatal visits as told by your health care provider. This is important. Safety  Wear your seat belt at all times when driving.  Make  a list of emergency phone numbers, including numbers for family, friends, the hospital, and police and fire departments. General instructions  Avoid cat litter boxes and soil used by cats. These carry germs that can cause birth defects in the baby. If you have a cat, ask someone to clean the litter box for you.  Do not travel far distances unless it is absolutely necessary and only with the approval of your health care provider.  Do not use hot tubs, steam rooms, or saunas.  Do not drink alcohol.  Do not use any products that contain nicotine or tobacco, such as cigarettes and e-cigarettes. If you need help quitting, ask your health care provider.  Do not use any medicinal herbs or unprescribed drugs. These chemicals affect the formation and growth of the baby.  Do not douche or use tampons or scented sanitary pads.  Do not cross your legs for long periods of time.  To prepare for the arrival of your baby: ? Take prenatal classes to understand, practice, and ask questions about labor and delivery. ? Make a trial run to the hospital. ? Visit the hospital and tour the maternity area. ? Arrange for maternity or paternity leave through employers. ? Arrange for family and friends to take care of pets while you are in the hospital. ? Purchase a rear-facing car seat and make sure you know how to install it in your car. ? Pack your hospital bag. ? Prepare the baby's nursery. Make sure to remove all pillows and stuffed animals from the baby's crib to prevent suffocation.  Visit your dentist if you have not gone during your pregnancy. Use a soft toothbrush to brush your teeth and be gentle when you floss. Contact a health care provider if:  You are unsure if you are in labor or if your water has broken.  You become dizzy.  You have mild pelvic cramps, pelvic pressure, or nagging pain in your abdominal area.  You have lower back pain.  You have persistent nausea, vomiting, or  diarrhea.  You have an unusual or bad smelling vaginal discharge.  You have pain when you urinate. Get help right away if:  Your water breaks before 37 weeks.  You have regular contractions less than 5 minutes apart before 37 weeks.  You have a fever.  You are leaking fluid from your vagina.  You have spotting or bleeding from your vagina.  You have severe abdominal pain or cramping.  You have rapid weight loss or weight gain.  You have   shortness of breath with chest pain.  You notice sudden or extreme swelling of your face, hands, ankles, feet, or legs.  Your baby makes fewer than 10 movements in 2 hours.  You have severe headaches that do not go away when you take medicine.  You have vision changes. Summary  The third trimester is from week 28 through week 40, months 7 through 9. The third trimester is a time when the unborn baby (fetus) is growing rapidly.  During the third trimester, your discomfort may increase as you and your baby continue to gain weight. You may have abdominal, leg, and back pain, sleeping problems, and an increased need to urinate.  During the third trimester your breasts will keep growing and they will continue to become tender. A yellow fluid (colostrum) may leak from your breasts. This is the first milk you are producing for your baby.  False labor is a condition in which you feel small, irregular tightenings of the muscles in the womb (contractions) that eventually go away. These are called Braxton Hicks contractions. Contractions may last for hours, days, or even weeks before true labor sets in.  Signs of labor can include: abdominal cramps; regular contractions that start at 10 minutes apart and become stronger and more frequent with time; watery or bloody mucus discharge that comes from the vagina; increased pelvic pressure and dull back pain; and leaking of amniotic fluid. This information is not intended to replace advice given to you by your  health care provider. Make sure you discuss any questions you have with your health care provider. Document Released: 08/30/2001 Document Revised: 10/11/2016 Document Reviewed: 10/11/2016 Elsevier Interactive Patient Education  2019 [employer]  

## 2018-09-10 NOTE — Progress Notes (Signed)
Subjective:  [name] [name] is a 32 y.o. [id number] at 810w2d being seen today for ongoing prenatal care.  She is currently monitored for the following issues for this low-risk pregnancy and has left cornual; Supervision of other normal pregnancy, antepartum; Previous cesarean delivery affecting pregnancy; and Rubella non-immune status, antepartum on their problem list.  Patient reports general discomforts of pregnancy.  Contractions: Irritability. Vag. Bleeding: None.  Movement: Present. Denies leaking of fluid.   The following portions of the patient's history were reviewed and updated as appropriate: allergies, current medications, past family history, past medical history, past social history, past surgical history and problem list. Problem list updated.  Objective:   Vitals:   09/10/18 1104  BP: 99/65  Pulse: (!) 103  Weight: 158 lb 8 oz (71.9 kg)    Fetal Status: Fetal Heart Rate (bpm): 147   Movement: Present     General:  Alert, oriented and cooperative. Patient is in no acute distress.  Skin: Skin is warm and dry. No rash noted.   Cardiovascular: Normal heart rate noted  Respiratory: Normal respiratory effort, no problems with respiration noted  Abdomen: Soft, gravid, appropriate for gestational age. Pain/Pressure: Present     Pelvic:  Cervical exam deferred        Extremities: Normal range of motion.  Edema: None  Mental Status: Normal mood and affect. Normal behavior. Normal judgment and thought content.   Urinalysis:      Assessment and Plan:  Pregnancy: [id number] at 2610w2d  1. Supervision of other normal pregnancy, antepartum Stable  2. Previous cesarean delivery affecting pregnancy Desires repeat with BTL  3. Rubella non-immune status, antepartum Vaccine PP  Preterm labor symptoms and general obstetric precautions including but not limited to vaginal bleeding, contractions, leaking of fluid and fetal movement were reviewed in detail with the patient. Please refer to  After Visit Summary for other counseling recommendations.  Return in about 2 weeks (around 09/24/2018) for OB visit.   Hermina StaggersErvin, Aleesha Ringstad L, MD

## 2018-09-10 NOTE — Progress Notes (Signed)
Pt is here for ROB. [redacted]w[redacted]d.  

## 2018-09-18 ENCOUNTER — Telehealth: Payer: Self-pay | Admitting: Obstetrics

## 2018-09-18 ENCOUNTER — Telehealth: Payer: Self-pay

## 2018-09-18 ENCOUNTER — Other Ambulatory Visit: Payer: Self-pay | Admitting: Obstetrics

## 2018-09-18 DIAGNOSIS — K0889 Other specified disorders of teeth and supporting structures: Secondary | ICD-10-CM

## 2018-09-18 DIAGNOSIS — K029 Dental caries, unspecified: Secondary | ICD-10-CM

## 2018-09-18 MED ORDER — OXYCODONE-ACETAMINOPHEN 5-325 MG PO TABS: 1.0000 | ORAL_TABLET | ORAL | 0 refills | Status: DC | PRN

## 2018-09-18 MED ORDER — CLINDAMYCIN HCL 300 MG PO CAPS: 300.0000 mg | ORAL_CAPSULE | Freq: Three times a day (TID) | ORAL | 0 refills | Status: DC

## 2018-09-18 NOTE — Telephone Encounter (Signed)
Returned call, pt stated that she has been having tooth pain, not relieved by tylenol and dentist refused to send her antibiotics or fill cavity due to pregnancy, transferred to Dr. Clearance CootsHarper.

## 2018-09-18 NOTE — Telephone Encounter (Signed)
Patient has a painful toothache and cavity and a fractured tooth that she states that her dentist refuses to treat because she is pregnant.  A/P:  Toothache, cavity and fractured tooth.  Clindamycin / Oxycodone Rx and a letter prepared for her dentist stating that it is safe and strongly encouraged to treat dental problems during pregnancy.  The patient has a follow up appointment with her dentist in a few days, and she will deliver our letter to her dentist, along with a standard letter to the dentist that we prepared for dentists regarding safe and recommended medications, etc.   Brock BadHARLES A. [place] Latulippe MD 09-18-2018

## 2018-09-19 NOTE — L&D Delivery Note (Signed)
Delivery Note Called for precipitous delivery - patient just received epidural. On my arrival, head was delivered by nurse. I assisted with remainder of delivery.   At 9:22 AM a viable female was delivered via  (Presentation: vertex; ROA).  APGAR: 9, 9; weight  Pending.   Placenta status: spontaneous, intact.  Cord: 3VC with the following complications: none.  Cord pH: n/a  Anesthesia:  epidural Episiotomy:  none Lacerations:  none Est. Blood Loss (mL):   Mom to postpartum.  Baby to Couplet care / Skin to Skin.  [name] [name] 11/06/2018, 9:46 AM

## 2018-09-20 ENCOUNTER — Encounter: Payer: Self-pay | Admitting: Obstetrics

## 2018-09-24 ENCOUNTER — Encounter (HOSPITAL_COMMUNITY): Payer: Self-pay

## 2018-09-25 ENCOUNTER — Encounter: Payer: Self-pay | Admitting: Obstetrics and Gynecology

## 2018-09-25 ENCOUNTER — Ambulatory Visit (INDEPENDENT_AMBULATORY_CARE_PROVIDER_SITE_OTHER): Payer: Medicaid Other | Admitting: Obstetrics and Gynecology

## 2018-09-25 VITALS — BP 106/71 | HR 99 | Wt 159.4 lb

## 2018-09-25 DIAGNOSIS — O34219 Maternal care for unspecified type scar from previous cesarean delivery: Secondary | ICD-10-CM

## 2018-09-25 DIAGNOSIS — Z283 Underimmunization status: Secondary | ICD-10-CM

## 2018-09-25 DIAGNOSIS — O9989 Other specified diseases and conditions complicating pregnancy, childbirth and the puerperium: Secondary | ICD-10-CM

## 2018-09-25 DIAGNOSIS — Z3A31 31 weeks gestation of pregnancy: Secondary | ICD-10-CM

## 2018-09-25 DIAGNOSIS — Z3480 Encounter for supervision of other normal pregnancy, unspecified trimester: Secondary | ICD-10-CM

## 2018-09-25 DIAGNOSIS — O09899 Supervision of other high risk pregnancies, unspecified trimester: Secondary | ICD-10-CM

## 2018-09-25 DIAGNOSIS — Z3483 Encounter for supervision of other normal pregnancy, third trimester: Secondary | ICD-10-CM

## 2018-09-25 NOTE — Progress Notes (Signed)
Pt presents for ROB has questions about cesarean date and presentation.

## 2018-09-25 NOTE — Patient Instructions (Signed)
Third Trimester of Pregnancy The third trimester is from week 28 through week 40 (months 7 through 9). The third trimester is a time when the unborn baby (fetus) is growing rapidly. At the end of the ninth month, the fetus is about 20 inches in length and weighs 6-10 pounds. Body changes during your third trimester Your body will continue to go through many changes during pregnancy. The changes vary from woman to woman. During the third trimester:  Your weight will continue to increase. You can expect to gain 25-35 pounds (11-16 kg) by the end of the pregnancy.  You may begin to get stretch marks on your hips, abdomen, and breasts.  You may urinate more often because the fetus is moving lower into your pelvis and pressing on your bladder.  You may develop or continue to have heartburn. This is caused by increased hormones that slow down muscles in the digestive tract.  You may develop or continue to have constipation because increased hormones slow digestion and cause the muscles that push waste through your intestines to relax.  You may develop hemorrhoids. These are swollen veins (varicose veins) in the rectum that can itch or be painful.  You may develop swollen, bulging veins (varicose veins) in your legs.  You may have increased body aches in the pelvis, back, or thighs. This is due to weight gain and increased hormones that are relaxing your joints.  You may have changes in your hair. These can include thickening of your hair, rapid growth, and changes in texture. Some women also have hair loss during or after pregnancy, or hair that feels dry or thin. Your hair will most likely return to normal after your baby is born.  Your breasts will continue to grow and they will continue to become tender. A yellow fluid (colostrum) may leak from your breasts. This is the first milk you are producing for your baby.  Your belly button may stick out.  You may notice more swelling in your hands,  face, or ankles.  You may have increased tingling or numbness in your hands, arms, and legs. The skin on your belly may also feel numb.  You may feel short of breath because of your expanding uterus.  You may have more problems sleeping. This can be caused by the size of your belly, increased need to urinate, and an increase in your body's metabolism.  You may notice the fetus "dropping," or moving lower in your abdomen (lightening).  You may have increased vaginal discharge.  You may notice your joints feel loose and you may have pain around your pelvic bone. What to expect at prenatal visits You will have prenatal exams every 2 weeks until week 36. Then you will have weekly prenatal exams. During a routine prenatal visit:  You will be weighed to make sure you and the baby are growing normally.  Your blood pressure will be taken.  Your abdomen will be measured to track your baby's growth.  The fetal heartbeat will be listened to.  Any test results from the previous visit will be discussed.  You may have a cervical check near your due date to see if your cervix has softened or thinned (effaced).  You will be tested for Group B streptococcus. This happens between 35 and 37 weeks. Your health care provider may ask you:  What your birth plan is.  How you are feeling.  If you are feeling the baby move.  If you have had any abnormal   symptoms, such as leaking fluid, bleeding, severe headaches, or abdominal cramping.  If you are using any tobacco products, including cigarettes, chewing tobacco, and electronic cigarettes.  If you have any questions. Other tests or screenings that may be performed during your third trimester include:  Blood tests that check for low iron levels (anemia).  Fetal testing to check the health, activity level, and growth of the fetus. Testing is done if you have certain medical conditions or if there are problems during the pregnancy.  Nonstress test  (NST). This test checks the health of your baby to make sure there are no signs of problems, such as the baby not getting enough oxygen. During this test, a belt is placed around your belly. The baby is made to move, and its heart rate is monitored during movement. What is false labor? False labor is a condition in which you feel small, irregular tightenings of the muscles in the womb (contractions) that usually go away with rest, changing position, or drinking water. These are called Braxton Hicks contractions. Contractions may last for hours, days, or even weeks before true labor sets in. If contractions come at regular intervals, become more frequent, increase in intensity, or become painful, you should see your health care provider. What are the signs of labor?  Abdominal cramps.  Regular contractions that start at 10 minutes apart and become stronger and more frequent with time.  Contractions that start on the top of the uterus and spread down to the lower abdomen and back.  Increased pelvic pressure and dull back pain.  A watery or bloody mucus discharge that comes from the vagina.  Leaking of amniotic fluid. This is also known as your "water breaking." It could be a slow trickle or a gush. Let your health care provider know if it has a color or strange odor. If you have any of these signs, call your health care provider right away, even if it is before your due date. Follow these instructions at home: Medicines  Follow your health care provider's instructions regarding medicine use. Specific medicines may be either safe or unsafe to take during pregnancy.  Take a prenatal vitamin that contains at least 600 micrograms (mcg) of folic acid.  If you develop constipation, try taking a stool softener if your health care provider approves. Eating and drinking   Eat a balanced diet that includes fresh fruits and vegetables, whole grains, good sources of protein such as meat, eggs, or tofu,  and low-fat dairy. Your health care provider will help you determine the amount of weight gain that is right for you.  Avoid raw meat and uncooked cheese. These carry germs that can cause birth defects in the baby.  If you have low calcium intake from food, talk to your health care provider about whether you should take a daily calcium supplement.  Eat four or five small meals rather than three large meals a day.  Limit foods that are high in fat and processed sugars, such as fried and sweet foods.  To prevent constipation: ? Drink enough fluid to keep your urine clear or pale yellow. ? Eat foods that are high in fiber, such as fresh fruits and vegetables, whole grains, and beans. Activity  Exercise only as directed by your health care provider. Most women can continue their usual exercise routine during pregnancy. Try to exercise for 30 minutes at least 5 days a week. Stop exercising if you experience uterine contractions.  Avoid heavy lifting.  Do   not exercise in extreme heat or humidity, or at high altitudes.  Wear low-heel, comfortable shoes.  Practice good posture.  You may continue to have sex unless your health care provider tells you otherwise. Relieving pain and discomfort  Take frequent breaks and rest with your legs elevated if you have leg cramps or low back pain.  Take warm sitz baths to soothe any pain or discomfort caused by hemorrhoids. Use hemorrhoid cream if your health care provider approves.  Wear a good support bra to prevent discomfort from breast tenderness.  If you develop varicose veins: ? Wear support pantyhose or compression stockings as told by your healthcare provider. ? Elevate your feet for 15 minutes, 3-4 times a day. Prenatal care  Write down your questions. Take them to your prenatal visits.  Keep all your prenatal visits as told by your health care provider. This is important. Safety  Wear your seat belt at all times when driving.  Make  a list of emergency phone numbers, including numbers for family, friends, the hospital, and police and fire departments. General instructions  Avoid cat litter boxes and soil used by cats. These carry germs that can cause birth defects in the baby. If you have a cat, ask someone to clean the litter box for you.  Do not travel far distances unless it is absolutely necessary and only with the approval of your health care provider.  Do not use hot tubs, steam rooms, or saunas.  Do not drink alcohol.  Do not use any products that contain nicotine or tobacco, such as cigarettes and e-cigarettes. If you need help quitting, ask your health care provider.  Do not use any medicinal herbs or unprescribed drugs. These chemicals affect the formation and growth of the baby.  Do not douche or use tampons or scented sanitary pads.  Do not cross your legs for long periods of time.  To prepare for the arrival of your baby: ? Take prenatal classes to understand, practice, and ask questions about labor and delivery. ? Make a trial run to the hospital. ? Visit the hospital and tour the maternity area. ? Arrange for maternity or paternity leave through employers. ? Arrange for family and friends to take care of pets while you are in the hospital. ? Purchase a rear-facing car seat and make sure you know how to install it in your car. ? Pack your hospital bag. ? Prepare the baby's nursery. Make sure to remove all pillows and stuffed animals from the baby's crib to prevent suffocation.  Visit your dentist if you have not gone during your pregnancy. Use a soft toothbrush to brush your teeth and be gentle when you floss. Contact a health care provider if:  You are unsure if you are in labor or if your water has broken.  You become dizzy.  You have mild pelvic cramps, pelvic pressure, or nagging pain in your abdominal area.  You have lower back pain.  You have persistent nausea, vomiting, or  diarrhea.  You have an unusual or bad smelling vaginal discharge.  You have pain when you urinate. Get help right away if:  Your water breaks before 37 weeks.  You have regular contractions less than 5 minutes apart before 37 weeks.  You have a fever.  You are leaking fluid from your vagina.  You have spotting or bleeding from your vagina.  You have severe abdominal pain or cramping.  You have rapid weight loss or weight gain.  You have   shortness of breath with chest pain.  You notice sudden or extreme swelling of your face, hands, ankles, feet, or legs.  Your baby makes fewer than 10 movements in 2 hours.  You have severe headaches that do not go away when you take medicine.  You have vision changes. Summary  The third trimester is from week 28 through week 40, months 7 through 9. The third trimester is a time when the unborn baby (fetus) is growing rapidly.  During the third trimester, your discomfort may increase as you and your baby continue to gain weight. You may have abdominal, leg, and back pain, sleeping problems, and an increased need to urinate.  During the third trimester your breasts will keep growing and they will continue to become tender. A yellow fluid (colostrum) may leak from your breasts. This is the first milk you are producing for your baby.  False labor is a condition in which you feel small, irregular tightenings of the muscles in the womb (contractions) that eventually go away. These are called Braxton Hicks contractions. Contractions may last for hours, days, or even weeks before true labor sets in.  Signs of labor can include: abdominal cramps; regular contractions that start at 10 minutes apart and become stronger and more frequent with time; watery or bloody mucus discharge that comes from the vagina; increased pelvic pressure and dull back pain; and leaking of amniotic fluid. This information is not intended to replace advice given to you by your  health care provider. Make sure you discuss any questions you have with your health care provider. Document Released: 08/30/2001 Document Revised: 10/11/2016 Document Reviewed: 10/11/2016 Elsevier Interactive Patient Education  2019 [employer]  

## 2018-09-25 NOTE — Progress Notes (Signed)
Subjective:  [name] [name] is a 33 y.o. [id number] at [redacted]w[redacted]d being seen today for ongoing prenatal care.  She is currently monitored for the following issues for this low-risk pregnancy and has left cornual; Supervision of other normal pregnancy, antepartum; Previous cesarean delivery affecting pregnancy; and Rubella non-immune status, antepartum on their problem list.  Patient reports no complaints.  Contractions: Irregular. Vag. Bleeding: None.  Movement: Present. Denies leaking of fluid.   The following portions of the patient's history were reviewed and updated as appropriate: allergies, current medications, past family history, past medical history, past social history, past surgical history and problem list. Problem list updated.  Objective:   Vitals:   09/25/18 1329  BP: 106/71  Pulse: 99  Weight: 159 lb 6.4 oz (72.3 kg)    Fetal Status:     Movement: Present     General:  Alert, oriented and cooperative. Patient is in no acute distress.  Skin: Skin is warm and dry. No rash noted.   Cardiovascular: Normal heart rate noted  Respiratory: Normal respiratory effort, no problems with respiration noted  Abdomen: Soft, gravid, appropriate for gestational age. Pain/Pressure: Present     Pelvic:  Cervical exam performed        Extremities: Normal range of motion.  Edema: None  Mental Status: Normal mood and affect. Normal behavior. Normal judgment and thought content.   Urinalysis:      Assessment and Plan:  Pregnancy: [id number] at [redacted]w[redacted]d  1. Supervision of other normal pregnancy, antepartum Stable  2. Previous cesarean delivery affecting pregnancy Desires repeat with BTL Schedule request sent to [place]  3. Rubella non-immune status, antepartum Vaccine PP  Preterm labor symptoms and general obstetric precautions including but not limited to vaginal bleeding, contractions, leaking of fluid and fetal movement were reviewed in detail with the patient. Please refer to After Visit  Summary for other counseling recommendations.  Return in about 2 weeks (around 10/09/2018) for OB visit.   Hermina Staggers, MD

## 2018-10-09 ENCOUNTER — Ambulatory Visit (INDEPENDENT_AMBULATORY_CARE_PROVIDER_SITE_OTHER): Payer: Medicaid Other | Admitting: Obstetrics and Gynecology

## 2018-10-09 ENCOUNTER — Encounter: Payer: Self-pay | Admitting: Obstetrics and Gynecology

## 2018-10-09 VITALS — BP 113/74 | HR 92 | Wt 160.4 lb

## 2018-10-09 DIAGNOSIS — Z2839 Other underimmunization status: Secondary | ICD-10-CM

## 2018-10-09 DIAGNOSIS — Z3483 Encounter for supervision of other normal pregnancy, third trimester: Secondary | ICD-10-CM

## 2018-10-09 DIAGNOSIS — Z3A33 33 weeks gestation of pregnancy: Secondary | ICD-10-CM

## 2018-10-09 DIAGNOSIS — O34219 Maternal care for unspecified type scar from previous cesarean delivery: Secondary | ICD-10-CM

## 2018-10-09 DIAGNOSIS — Z3480 Encounter for supervision of other normal pregnancy, unspecified trimester: Secondary | ICD-10-CM

## 2018-10-09 DIAGNOSIS — Z283 Underimmunization status: Secondary | ICD-10-CM

## 2018-10-09 DIAGNOSIS — O9989 Other specified diseases and conditions complicating pregnancy, childbirth and the puerperium: Secondary | ICD-10-CM

## 2018-10-09 NOTE — Progress Notes (Signed)
   PRENATAL VISIT NOTE  Subjective:  [name] [name] is a 33 y.o. [id number] at [redacted]w[redacted]d being seen today for ongoing prenatal care.  She is currently monitored for the following issues for this low-risk pregnancy and has left cornual; Supervision of other normal pregnancy, antepartum; Previous cesarean delivery affecting pregnancy; and Rubella non-immune status, antepartum on their problem list.  Patient reports incomplete emptying of the bladder sensation.  Contractions: Irritability. Vag. Bleeding: None.  Movement: Present. Denies leaking of fluid.   The following portions of the patient's history were reviewed and updated as appropriate: allergies, current medications, past family history, past medical history, past social history, past surgical history and problem list. Problem list updated.  Objective:   Vitals:   10/09/18 1111  BP: 113/74  Pulse: 92  Weight: 160 lb 6.4 oz (72.8 kg)    Fetal Status: Fetal Heart Rate (bpm): 140 Fundal Height: 33 cm Movement: Present     General:  Alert, oriented and cooperative. Patient is in no acute distress.  Skin: Skin is warm and dry. No rash noted.   Cardiovascular: Normal heart rate noted  Respiratory: Normal respiratory effort, no problems with respiration noted  Abdomen: Soft, gravid, appropriate for gestational age.  Pain/Pressure: Present     Pelvic: Cervical exam deferred        Extremities: Normal range of motion.  Edema: None  Mental Status: Normal mood and affect. Normal behavior. Normal judgment and thought content.   Assessment and Plan:  Pregnancy: [id number] at [redacted]w[redacted]d  1. Supervision of other normal pregnancy, antepartum Patient is doing well Urine Cx today Patient requested medical leave. Informed patient that there are no medical indications at this time to be out of work. Advised to discuss with employer and agree on date in order to complete FMLA forms  2. Previous cesarean delivery affecting pregnancy Patient scheduled for  repeat with BTL on 3/1  3. Rubella non-immune status, antepartum Will offer postpartum  Preterm labor symptoms and general obstetric precautions including but not limited to vaginal bleeding, contractions, leaking of fluid and fetal movement were reviewed in detail with the patient. Please refer to After Visit Summary for other counseling recommendations.  Return in about 4 weeks (around 11/06/2018) for ROB.  No future appointments.  Catalina Antigua, MD

## 2018-10-10 ENCOUNTER — Encounter: Payer: Medicaid Other | Admitting: Obstetrics and Gynecology

## 2018-10-11 LAB — URINE CULTURE, OB REFLEX: Organism ID, Bacteria: NO GROWTH

## 2018-10-11 LAB — CULTURE, OB URINE

## 2018-10-22 ENCOUNTER — Encounter (HOSPITAL_COMMUNITY): Payer: Self-pay

## 2018-10-22 ENCOUNTER — Inpatient Hospital Stay (HOSPITAL_COMMUNITY)
Admission: AD | Admit: 2018-10-22 | Discharge: 2018-10-22 | Disposition: A | Discharge status: Home / Self Care | Payer: Medicaid Other | Attending: Obstetrics and Gynecology | Admitting: Obstetrics and Gynecology

## 2018-10-22 ENCOUNTER — Other Ambulatory Visit: Payer: Self-pay

## 2018-10-22 DIAGNOSIS — Z3A35 35 weeks gestation of pregnancy: Secondary | ICD-10-CM | POA: Diagnosis not present

## 2018-10-22 DIAGNOSIS — O9989 Other specified diseases and conditions complicating pregnancy, childbirth and the puerperium: Secondary | ICD-10-CM

## 2018-10-22 DIAGNOSIS — R109 Unspecified abdominal pain: Secondary | ICD-10-CM | POA: Diagnosis present

## 2018-10-22 DIAGNOSIS — Z283 Underimmunization status: Secondary | ICD-10-CM

## 2018-10-22 DIAGNOSIS — O34219 Maternal care for unspecified type scar from previous cesarean delivery: Secondary | ICD-10-CM

## 2018-10-22 DIAGNOSIS — O4703 False labor before 37 completed weeks of gestation, third trimester: Secondary | ICD-10-CM | POA: Diagnosis not present

## 2018-10-22 DIAGNOSIS — O09899 Supervision of other high risk pregnancies, unspecified trimester: Secondary | ICD-10-CM

## 2018-10-22 DIAGNOSIS — O479 False labor, unspecified: Secondary | ICD-10-CM

## 2018-10-22 DIAGNOSIS — Z3480 Encounter for supervision of other normal pregnancy, unspecified trimester: Secondary | ICD-10-CM

## 2018-10-22 LAB — URINALYSIS, ROUTINE W REFLEX MICROSCOPIC
Bilirubin Urine: NEGATIVE
Glucose, UA: >=500 mg/dL — AB
Hgb urine dipstick: NEGATIVE
Ketones, ur: 15 mg/dL — AB
Leukocytes, UA: NEGATIVE
Nitrite: NEGATIVE
Protein, ur: NEGATIVE mg/dL
Specific Gravity, Urine: >1.03 — ABNORMAL HIGH (ref 1.005–1.030)
pH: 5.5 (ref 5.0–8.0)

## 2018-10-22 LAB — WET PREP, GENITAL
CLUE CELLS WET PREP: NONE SEEN
Sperm: NONE SEEN
Trich, Wet Prep: NONE SEEN
Yeast Wet Prep HPF POC: NONE SEEN

## 2018-10-22 LAB — URINALYSIS, MICROSCOPIC (REFLEX)
Bacteria, UA: NONE SEEN
RBC / HPF: NONE SEEN RBC/hpf (ref 0–5)

## 2018-10-22 MED ORDER — OXYCODONE-ACETAMINOPHEN 5-325 MG PO TABS: 2.0000 | ORAL_TABLET | Freq: Once | ORAL | Status: DC

## 2018-10-22 MED ORDER — LACTATED RINGERS IV SOLN
Freq: Once | INTRAVENOUS | Status: AC
  Administered 2018-10-22: 17:00:00 via INTRAVENOUS

## 2018-10-22 NOTE — Discharge Instructions (Signed)
Preterm Labor and Birth Information °Pregnancy normally lasts 39-41 weeks. Preterm labor is when labor starts early. It starts before you have been pregnant for 37 whole weeks. °What are the risk factors for preterm labor? °Preterm labor is more likely to occur in women who: °· Have an infection while pregnant. °· Have a cervix that is short. °· Have gone into preterm labor before. °· Have had surgery on their cervix. °· Are younger than age 35. °· Are older than age 35. °· Are African American. °· Are pregnant with two or more babies. °· Take street drugs while pregnant. °· Smoke while pregnant. °· Do not gain enough weight while pregnant. °· Got pregnant right after another pregnancy. °What are the symptoms of preterm labor? °Symptoms of preterm labor include: °· Cramps. The cramps may feel like the cramps some women get during their period. The cramps may happen with watery poop (diarrhea). °· Pain in the belly (abdomen). °· Pain in the lower back. °· Regular contractions or tightening. It may feel like your belly is getting tighter. °· Pressure in the lower belly that seems to get stronger. °· More fluid (discharge) leaking from the vagina. The fluid may be watery or bloody. °· Water breaking. °Why is it important to notice signs of preterm labor? °Babies who are born early may not be fully developed. They have a higher chance for: °· Long-term heart problems. °· Long-term lung problems. °· Trouble controlling body systems, like breathing. °· Bleeding in the brain. °· A condition called cerebral palsy. °· Learning difficulties. °· Death. °These risks are highest for babies who are born before 34 weeks of pregnancy. °How is preterm labor treated? °Treatment depends on: °· How long you were pregnant. °· Your condition. °· The health of your baby. °Treatment may involve: °· Having a stitch (suture) placed in your cervix. When you give birth, your cervix opens so the baby can come out. The stitch keeps the cervix  from opening too soon. °· Staying at the hospital. °· Taking or getting medicines, such as: °? Hormone medicines. °? Medicines to stop contractions. °? Medicines to help the baby’s lungs develop. °? Medicines to prevent your baby from having cerebral palsy. °What should I do if I am in preterm labor? °If you think you are going into labor too soon, call your doctor right away. °How can I prevent preterm labor? °· Do not use any tobacco products. °? Examples of these are cigarettes, chewing tobacco, and e-cigarettes. °? If you need help quitting, ask your doctor. °· Do not use street drugs. °· Do not use any medicines unless you ask your doctor if they are safe for you. °· Talk with your doctor before taking any herbal supplements. °· Make sure you gain enough weight. °· Watch for infection. If you think you might have an infection, get it checked right away. °· If you have gone into preterm labor before, tell your doctor. °This information is not intended to replace advice given to you by your health care provider. Make sure you discuss any questions you have with your health care provider. °Document Released: 12/02/2008 Document Revised: 02/16/2016 Document Reviewed: 01/27/2016 °Elsevier Interactive Patient Education © 2019 [employer] ° °

## 2018-10-22 NOTE — MAU Provider Note (Signed)
History     CSN: [id number]  Arrival date and time: 10/22/18 1551   First Provider Initiated Contact with Patient 10/22/18 1629      Chief Complaint  Patient presents with  . Rupture of Membranes   HPI [name] [name] is a 33 y.o. [id number] at [redacted]w[redacted]d who presents to MAU with chief complaint of abdominal and back pain "like a menstrual cramp". This is a recurring problem, onset one week ago. Patient works in [employer] and experienced an acute onset of more severe pain during her shift today, about 12:30pm.  Patient also complains of leaking of fluid. This is a recurring problem, onset within the past week. She denies abnormal vaginal discharge and sexual intercourse.  Patient Denies vaginal bleeding, decreased fetal movement, fever, falls, or recent illness.     OB History    Gravida  11   Para  4   Term  4   Preterm  0   AB  6   Living  4     SAB  1   TAB  4   Ectopic  1   Multiple  0   Live Births  4        Obstetric Comments  SAB x 1. Ectopic x 1. TAB x 4.         Past Medical History:  Diagnosis Date  . Gonorrhea     Past Surgical History:  Procedure Laterality Date  . CESAREAN SECTION  05/09/2012   Procedure: CESAREAN SECTION;  Surgeon: Antionette Char, MD;  Location: [facility];  Service: Gynecology;  Laterality: N/A;  . WISDOM TOOTH EXTRACTION      Family History  Problem Relation Age of Onset  . Heart disease Mother   . Cancer Father   . Anesthesia problems Neg Hx   . Other Neg Hx     Social History   Tobacco Use  . Smoking status: Never Smoker  . Smokeless tobacco: Never Used  Substance Use Topics  . Alcohol use: No  . Drug use: No    Allergies:  Allergies  Allergen Reactions  . Oseltamivir Phosphate Rash    Medications Prior to Admission  Medication Sig Dispense Refill Last Dose  . albuterol (PROVENTIL HFA;VENTOLIN HFA) 108 (90 Base) MCG/ACT inhaler Inhale 2 puffs into the lungs every 6 (six) hours as needed for  wheezing or shortness of breath. 1 Inhaler 2 Taking  . Elastic Bandages & Supports (COMFORT FIT MATERNITY SUPP MED) MISC Wear daily when ambulating 1 each 0 Taking  . ferrous sulfate (FERROUSUL) 325 (65 FE) MG tablet Take 1 tablet (325 mg total) by mouth 2 (two) times daily. 60 tablet 1 Taking  . Prenat-Fe Poly-Methfol-FA-DHA (VITAFOL ULTRA) 29-0.6-0.4-200 MG CAPS Take 1 capsule by mouth daily before breakfast. 90 capsule 4 Taking    Review of Systems  Constitutional: Negative for chills, fatigue and fever.  Gastrointestinal: Positive for abdominal pain.  Genitourinary: Negative for difficulty urinating, vaginal bleeding, vaginal discharge and vaginal pain.  Musculoskeletal: Positive for back pain.  Neurological: Negative for headaches.  All other systems reviewed and are negative.  Physical Exam   Blood pressure 113/76, pulse (!) 102, temperature 98.6 F (37 C), temperature source Oral, resp. rate 18, weight 73.1 kg, last menstrual period 02/17/2018, SpO2 100 %.  Physical Exam  Nursing note and vitals reviewed. Constitutional: She is oriented to person, place, and time. She appears well-developed and well-nourished.  Cardiovascular: Normal rate.  Respiratory: Effort normal.  GI: There is  no abdominal tenderness. There is no CVA tenderness.  Gravid  Neurological: She is alert and oriented to person, place, and time.  Skin: Skin is warm and dry.  Psychiatric: She has a normal mood and affect. Her behavior is normal. Judgment and thought content normal.    MAU Course/MDM   --Hx term delivery x 4 --Closed cervix --Reactive tracing: baseline 145, moderate variability, positive accels, no decels --Toco: irregular contractions, palpate mild --Patient declined pain medication when offered. Verbalized to RN she was "feeling much better" --Glucose in urine, normal 2 hour GTT  Patient Vitals for the past 24 hrs:  BP Temp Temp src Pulse Resp SpO2 Weight  10/22/18 1811 111/70 - - - - -  -  10/22/18 1605 113/76 98.6 F (37 C) Oral (!) 102 18 100 % 73.1 kg    Results for orders placed or performed during the hospital encounter of 10/22/18 (from the past 24 hour(s))  Urinalysis, Routine w reflex microscopic     Status: Abnormal   Collection Time: 10/22/18  4:13 PM  Result Value Ref Range   Color, Urine YELLOW YELLOW   APPearance HAZY (A) CLEAR   Specific Gravity, Urine >1.030 (H) 1.005 - 1.030   pH 5.5 5.0 - 8.0   Glucose, UA >=500 (A) NEGATIVE mg/dL   Hgb urine dipstick NEGATIVE NEGATIVE   Bilirubin Urine NEGATIVE NEGATIVE   Ketones, ur 15 (A) NEGATIVE mg/dL   Protein, ur NEGATIVE NEGATIVE mg/dL   Nitrite NEGATIVE NEGATIVE   Leukocytes, UA NEGATIVE NEGATIVE  Urinalysis, Microscopic (reflex)     Status: None   Collection Time: 10/22/18  4:13 PM  Result Value Ref Range   RBC / HPF NONE SEEN 0 - 5 RBC/hpf   WBC, UA 0-5 0 - 5 WBC/hpf   Bacteria, UA NONE SEEN NONE SEEN   Squamous Epithelial / LPF 0-5 0 - 5  Wet prep, genital     Status: Abnormal   Collection Time: 10/22/18  4:36 PM  Result Value Ref Range   Yeast Wet Prep HPF POC NONE SEEN NONE SEEN   Trich, Wet Prep NONE SEEN NONE SEEN   Clue Cells Wet Prep HPF POC NONE SEEN NONE SEEN   WBC, Wet Prep HPF POC MANY (A) NONE SEEN   Sperm NONE SEEN      Assessment and Plan  --33 y.o. [id number] at 10487w2d  --Braxton Hicks contractions, resolving s/p IV fluids --Reactive tracing --Closed cervix --Discharge home in stable condition  [facility] 10/[facility]/2018  Calvert CantorSamantha C Antonie Borjon, CNM 10/22/2018, 6:32 PM

## 2018-10-22 NOTE — MAU Note (Signed)
Leaking something.  Milky fluid twice, last wk.  Lost her mucous plug.  And today, has been having some gushing of watery fluid.

## 2018-10-23 LAB — GC/CHLAMYDIA PROBE AMP (~~LOC~~) NOT AT ARMC
Chlamydia: NEGATIVE
Neisseria Gonorrhea: NEGATIVE

## 2018-10-25 ENCOUNTER — Inpatient Hospital Stay (HOSPITAL_COMMUNITY)
Admission: AD | Admit: 2018-10-25 | Discharge: 2018-10-26 | Disposition: A | Discharge status: Home / Self Care | Payer: Medicaid Other | Attending: Family Medicine | Admitting: Family Medicine

## 2018-10-25 ENCOUNTER — Encounter (HOSPITAL_COMMUNITY): Payer: Self-pay

## 2018-10-25 DIAGNOSIS — Z3A35 35 weeks gestation of pregnancy: Secondary | ICD-10-CM | POA: Diagnosis not present

## 2018-10-25 DIAGNOSIS — O4693 Antepartum hemorrhage, unspecified, third trimester: Secondary | ICD-10-CM | POA: Insufficient documentation

## 2018-10-25 DIAGNOSIS — Z3A36 36 weeks gestation of pregnancy: Secondary | ICD-10-CM | POA: Diagnosis not present

## 2018-10-25 DIAGNOSIS — O4703 False labor before 37 completed weeks of gestation, third trimester: Secondary | ICD-10-CM | POA: Diagnosis not present

## 2018-10-25 LAB — URINALYSIS, ROUTINE W REFLEX MICROSCOPIC
Bilirubin Urine: NEGATIVE
Glucose, UA: 250 mg/dL — AB
Ketones, ur: NEGATIVE mg/dL
Nitrite: NEGATIVE
Protein, ur: NEGATIVE mg/dL
Specific Gravity, Urine: 1.01 (ref 1.005–1.030)
pH: 6.5 (ref 5.0–8.0)

## 2018-10-25 LAB — POCT FERN TEST: POCT Fern Test: NEGATIVE

## 2018-10-25 LAB — URINALYSIS, MICROSCOPIC (REFLEX): Bacteria, UA: NONE SEEN

## 2018-10-25 MED ORDER — BETAMETHASONE SOD PHOS & ACET 6 (3-3) MG/ML IJ SUSP
12.0000 mg | Freq: Once | INTRAMUSCULAR | Status: AC
  Administered 2018-10-25: 12 mg via INTRAMUSCULAR
  Filled 2018-10-25: qty 2

## 2018-10-25 NOTE — MAU Provider Note (Addendum)
Chief Complaint:  Vaginal Bleeding   First Provider Initiated Contact with Patient 10/25/18 1955     HPI: [name] [name] is a 33 y.o. [id number] at 2686w5d who presents to maternity admissions reporting bloody discharge. Was seen a few days ago for thin discharge. States the discharge has continued but earlier today had a bright red mucoid component to it. Reports some contractions but states she always feels those and hasn't been timing them. No recent intercourse. Last exam was on Monday.  Good fetal movement.  Location: abdomen Quality: cramping Severity: 5/10 in pain scale Duration: "for a while" Timing: hasn't been timing Modifying factors: none Associated signs and symptoms: vaginal discharge  Past Medical History:  Diagnosis Date  . Gonorrhea    OB History  Gravida Para Term Preterm AB Living  11 4 4  0 6 4  SAB TAB Ectopic Multiple Live Births  1 4 1  0 4    # Outcome Date GA Lbr Len/2nd Weight Sex Delivery Anes PTL Lv  11 Current           10 Ectopic 06/19/16 1157w6d    ECTOPIC     9 Term 05/09/12 3363w5d / 10:58 4460 g M CS-LVertical EPI  LIV     Birth Comments: WNL   8 TAB 2010          7 TAB 2009          6 TAB 2009          5 TAB 2008          4 Term 2007 1734w0d 03:00 2920 g F Vag-Spont EPI  LIV  3 Term 2005 3434w0d 06:00 2410 g M Vag-Spont EPI  LIV  2 Term 2003 2634w0d 23:00 3629 g F Vag-Spont EPI  LIV  1 SAB 2002            Obstetric Comments  SAB x 1. Ectopic x 1. TAB x 4.    Past Surgical History:  Procedure Laterality Date  . CESAREAN SECTION  05/09/2012   Procedure: CESAREAN SECTION;  Surgeon: Antionette CharLisa Jackson-Moore, MD;  Location: [facility];  Service: Gynecology;  Laterality: N/A;  . WISDOM TOOTH EXTRACTION     Family History  Problem Relation Age of Onset  . Heart disease Mother   . Cancer Father   . Anesthesia problems Neg Hx   . Other Neg Hx    Social History   Tobacco Use  . Smoking status: Never Smoker  . Smokeless tobacco: Never Used  Substance Use Topics   . Alcohol use: No  . Drug use: No   Allergies  Allergen Reactions  . Oseltamivir Phosphate Rash   Medications Prior to Admission  Medication Sig Dispense Refill Last Dose  . acetaminophen (TYLENOL) 500 MG tablet Take 500 mg by mouth every 6 (six) hours as needed.   10/25/2018 at Unknown time  . ferrous sulfate (FERROUSUL) 325 (65 FE) MG tablet Take 1 tablet (325 mg total) by mouth 2 (two) times daily. 60 tablet 1 Past Week at Unknown time  . Prenat-Fe Poly-Methfol-FA-DHA (VITAFOL ULTRA) 29-0.6-0.4-200 MG CAPS Take 1 capsule by mouth daily before breakfast. 90 capsule 4 10/25/2018 at Unknown time  . albuterol (PROVENTIL HFA;VENTOLIN HFA) 108 (90 Base) MCG/ACT inhaler Inhale 2 puffs into the lungs every 6 (six) hours as needed for wheezing or shortness of breath. 1 Inhaler 2 Unknown at Unknown time  . Elastic Bandages & Supports (COMFORT FIT MATERNITY SUPP MED) MISC Wear daily when ambulating 1  each 0 Taking    I have reviewed patient's Past Medical Hx, Surgical Hx, Family Hx, Social Hx, medications and allergies.   ROS:  Review of Systems  Constitutional: Negative.   Gastrointestinal: Positive for abdominal pain.  Genitourinary: Positive for vaginal bleeding and vaginal discharge. Negative for dysuria.    Physical Exam   Patient Vitals for the past 24 hrs:  BP Temp Pulse Resp SpO2 Height Weight  10/25/18 1927 122/73 99 F (37.2 C) 95 19 100 % 5' (1.524 m) 73.6 kg    Constitutional: Well-developed, well-nourished female in no acute distress.  Cardiovascular: normal rate & rhythm, no murmur Respiratory: normal effort, lung sounds clear throughout GI: Abd soft, non-tender, gravid appropriate for gestational age. Pos BS x 4 MS: Extremities nontender, no edema, normal ROM Neurologic: Alert and oriented x 4.  GU:      Pelvic: NEFG, physiologic discharge, no blood, cervix clean.   Dilation: 4 Effacement (%): 70 Cervical Position: Anterior Station: Ballotable Presentation:  Vertex Exam by:: Estanislado SpireE Lawrence NP  NST:  Baseline: 145 bpm, Variability: Good {> 6 bpm), Accelerations: Reactive and Decelerations: Absent   Labs: Results for orders placed or performed during the hospital encounter of 10/25/18 (from the past 24 hour(s))  Urinalysis, Routine w reflex microscopic     Status: Abnormal   Collection Time: 10/25/18  7:09 PM  Result Value Ref Range   Color, Urine YELLOW YELLOW   APPearance CLEAR CLEAR   Specific Gravity, Urine 1.010 1.005 - 1.030   pH 6.5 5.0 - 8.0   Glucose, UA 250 (A) NEGATIVE mg/dL   Hgb urine dipstick TRACE (A) NEGATIVE   Bilirubin Urine NEGATIVE NEGATIVE   Ketones, ur NEGATIVE NEGATIVE mg/dL   Protein, ur NEGATIVE NEGATIVE mg/dL   Nitrite NEGATIVE NEGATIVE   Leukocytes, UA TRACE (A) NEGATIVE  Urinalysis, Microscopic (reflex)     Status: None   Collection Time: 10/25/18  7:09 PM  Result Value Ref Range   RBC / HPF 0-5 0 - 5 RBC/hpf   WBC, UA 0-5 0 - 5 WBC/hpf   Bacteria, UA NONE SEEN NONE SEEN   Squamous Epithelial / LPF 0-5 0 - 5  Fern Test     Status: Normal   Collection Time: 10/25/18  8:20 PM  Result Value Ref Range   POCT Fern Test Negative = intact amniotic membranes     Imaging:  No results found.  MAU Course: Orders Placed This Encounter  Procedures  . Urinalysis, Routine w reflex microscopic  . Urinalysis, Microscopic (reflex)  . Diet NPO time specified  . Fern Test   Meds ordered this encounter  Medications  . betamethasone acetate-betamethasone sodium phosphate (CELESTONE) injection 12 mg    MDM: No pooling of fluid on exam & fern negative No blood noted on spec exam. Minimal amount of dark red/tan mucous on fingers after digital exam RH positive Cervix changed from 1/50/ballotable to 4/70/-3. Irregular contractions on monitor. Discussed patient with Dr. Shawnie PonsPratt. Will give BMZ & recheck in an hour.   Care turned over to Wynelle BourgeoisMarie Kiya Eno CNM Judeth HornLawrence, Erin, NP 10/25/2018 9:29 PM  Rechecked cervix after an  hour 4cm/80/-3/vertex Still having frequent contractions, so decided to observe and recheck Cervix unchanged except it progressed slightly to 4.5cm  Offered IV fluids and Procardia for relief, but pt refused Wants to go home  Consulted Dr Shawnie PonsPRatt Will discharge home with close followup and return precautions Needs second betamethasone tomorrow  Encouraged to return here or to other Urgent  Care/ED if she develops worsening of symptoms, increase in pain, fever, or other concerning symptoms.    Aviva Signs, CNM

## 2018-10-25 NOTE — MAU Note (Signed)
Was here having yellow-watery discharge 3 days ago-not ruptured though she is still having this discharge.  Started having bright red/mucousy discharge around 1845.  Last intercourse on Friday.  Reports feeling irregular ctx also-states it's been ongoing.  Took tylenol 2 hours ago for h/a and it went away.  + FM.

## 2018-10-26 NOTE — Discharge Instructions (Signed)

## 2018-10-27 ENCOUNTER — Encounter (HOSPITAL_COMMUNITY): Payer: Self-pay | Admitting: *Deleted

## 2018-10-27 ENCOUNTER — Inpatient Hospital Stay (HOSPITAL_COMMUNITY)
Admission: AD | Admit: 2018-10-27 | Discharge: 2018-10-27 | Disposition: A | Discharge status: Home / Self Care | Payer: Medicaid Other | Source: Ambulatory Visit | Attending: Obstetrics and Gynecology | Admitting: Obstetrics and Gynecology

## 2018-10-27 ENCOUNTER — Other Ambulatory Visit: Payer: Self-pay

## 2018-10-27 DIAGNOSIS — Z3A36 36 weeks gestation of pregnancy: Secondary | ICD-10-CM | POA: Diagnosis not present

## 2018-10-27 DIAGNOSIS — O4703 False labor before 37 completed weeks of gestation, third trimester: Secondary | ICD-10-CM

## 2018-10-27 MED ORDER — LACTATED RINGERS IV BOLUS: 1000.0000 mL | Freq: Once | INTRAVENOUS | Status: DC

## 2018-10-27 NOTE — Discharge Instructions (Signed)

## 2018-10-27 NOTE — MAU Provider Note (Signed)
  [name] [name] [id number]  S: [name] [name] is a 33 y.o. [id number] at 6262w0d who presents for Contractions and Pelvic Pain Nurse reports VE remains unchanged, but patient continues to report discomfort with contractions.   O:  Vitals:   10/27/18 2006 10/27/18 2009 10/27/18 2201  BP:  127/87 108/74  Pulse:  (!) 108 97  Resp:  18   Temp:  98.4 F (36.9 C)   TempSrc:  Oral   Weight: 73.5 kg     No results found for this or any previous visit (from the past 24 hour(s)).  Dilation: 4.5 Effacement (%): 70 Cervical Position: Posterior Station: Ballotable Presentation: Vertex Exam by:: AMariel Sleet. Gagliardo, RN   NST:  FHR: 135 bpm, Mod Var, - Decels, + Accels UC: Q1-14min   A: IUP at 36 wks Cat I FT Contractions NST Reactive   P: Dr. Earlene Plateravis consulted and advised: *Offer IV fluids Patient declines and requests discharge Strip reviewed-Reactive Nurse instructed: -Okay to discharge patient -Provide Labor Precautions  Sabas SousJ. Wardell Pokorski, MSN, CNM 10:07 PM

## 2018-10-27 NOTE — MAU Note (Signed)
Pt presents to MAU c/o ctx every 2-4 min. No bleeding or LOF. DFM today.

## 2018-10-29 ENCOUNTER — Ambulatory Visit (INDEPENDENT_AMBULATORY_CARE_PROVIDER_SITE_OTHER): Payer: Medicaid Other | Admitting: Obstetrics and Gynecology

## 2018-10-29 ENCOUNTER — Encounter: Payer: Self-pay | Admitting: Obstetrics and Gynecology

## 2018-10-29 VITALS — BP 126/76 | HR 90 | Wt 161.6 lb

## 2018-10-29 DIAGNOSIS — Z3483 Encounter for supervision of other normal pregnancy, third trimester: Secondary | ICD-10-CM

## 2018-10-29 DIAGNOSIS — Z3480 Encounter for supervision of other normal pregnancy, unspecified trimester: Secondary | ICD-10-CM

## 2018-10-29 NOTE — Progress Notes (Signed)
   PRENATAL VISIT NOTE  Subjective:  [name] [name] is a 33 y.o. [id number] at [redacted]w[redacted]d being seen today for ongoing prenatal care.  She is currently monitored for the following issues for this low-risk pregnancy and has left cornual; Supervision of other normal pregnancy, antepartum; Previous cesarean delivery affecting pregnancy; and Rubella non-immune status, antepartum on their problem list.  Patient reports no complaints.  Contractions: Irritability. Vag. Bleeding: None.  Movement: Present. Denies leaking of fluid.   The following portions of the patient's history were reviewed and updated as appropriate: allergies, current medications, past family history, past medical history, past social history, past surgical history and problem list. Problem list updated.  Objective:   Vitals:   10/29/18 1014  BP: 126/76  Pulse: 90  Weight: 161 lb 9.6 oz (73.3 kg)    Fetal Status: Fetal Heart Rate (bpm): 148 Fundal Height: 37 cm Movement: Present  Presentation: Vertex  General:  Alert, oriented and cooperative. Patient is in no acute distress.  Skin: Skin is warm and dry. No rash noted.   Cardiovascular: Normal heart rate noted  Respiratory: Normal respiratory effort, no problems with respiration noted  Abdomen: Soft, gravid, appropriate for gestational age.  Pain/Pressure: Present     Pelvic: Cervical exam performed Dilation: 4.5 Effacement (%): 70 Station: -3  Extremities: Normal range of motion.  Edema: None  Mental Status: Normal mood and affect. Normal behavior. Normal judgment and thought content.   Assessment and Plan:  Pregnancy: [id number] at [redacted]w[redacted]d  1. Supervision of other normal pregnancy, antepartum Patient is doing well without complaints Cultures today - Strep Gp B NAA  Preterm labor symptoms and general obstetric precautions including but not limited to vaginal bleeding, contractions, leaking of fluid and fetal movement were reviewed in detail with the patient. Please refer to  After Visit Summary for other counseling recommendations.  Return in about 1 week (around 11/05/2018) for ROB.  No future appointments.  Catalina Antigua, MD

## 2018-10-29 NOTE — Progress Notes (Signed)
Pt is here for ROB. [redacted]w[redacted]d.  

## 2018-10-31 LAB — STREP GP B NAA: Strep Gp B NAA: NEGATIVE

## 2018-11-02 ENCOUNTER — Encounter (HOSPITAL_COMMUNITY): Payer: Self-pay

## 2018-11-05 ENCOUNTER — Ambulatory Visit (INDEPENDENT_AMBULATORY_CARE_PROVIDER_SITE_OTHER): Payer: Medicaid Other | Admitting: Obstetrics and Gynecology

## 2018-11-05 ENCOUNTER — Encounter: Payer: Self-pay | Admitting: Obstetrics and Gynecology

## 2018-11-05 VITALS — BP 106/68 | HR 98 | Wt 163.0 lb

## 2018-11-05 DIAGNOSIS — Z2839 Other underimmunization status: Secondary | ICD-10-CM

## 2018-11-05 DIAGNOSIS — Z3480 Encounter for supervision of other normal pregnancy, unspecified trimester: Secondary | ICD-10-CM

## 2018-11-05 DIAGNOSIS — Z3A37 37 weeks gestation of pregnancy: Secondary | ICD-10-CM

## 2018-11-05 DIAGNOSIS — Z283 Underimmunization status: Secondary | ICD-10-CM

## 2018-11-05 DIAGNOSIS — O34219 Maternal care for unspecified type scar from previous cesarean delivery: Secondary | ICD-10-CM

## 2018-11-05 DIAGNOSIS — O9989 Other specified diseases and conditions complicating pregnancy, childbirth and the puerperium: Secondary | ICD-10-CM

## 2018-11-05 DIAGNOSIS — Z3483 Encounter for supervision of other normal pregnancy, third trimester: Secondary | ICD-10-CM

## 2018-11-05 NOTE — Progress Notes (Signed)
   PRENATAL VISIT NOTE  Subjective:  [name] [name] is a 33 y.o. [id number] at [redacted]w[redacted]d being seen today for ongoing prenatal care.  She is currently monitored for the following issues for this low-risk pregnancy and has left cornual; Supervision of other normal pregnancy, antepartum; Previous cesarean delivery affecting pregnancy; and Rubella non-immune status, antepartum on their problem list.  Patient reports contractions.  Contractions: Irregular. Vag. Bleeding: None.  Movement: Present. Denies leaking of fluid.   The following portions of the patient's history were reviewed and updated as appropriate: allergies, current medications, past family history, past medical history, past social history, past surgical history and problem list. Problem list updated.  Objective:   Vitals:   11/05/18 1330  BP: 106/68  Pulse: 98  Weight: 163 lb (73.9 kg)    Fetal Status: Fetal Heart Rate (bpm): 137 Fundal Height: 37 cm Movement: Present  Presentation: Vertex  General:  Alert, oriented and cooperative. Patient is in no acute distress.  Skin: Skin is warm and dry. No rash noted.   Cardiovascular: Normal heart rate noted  Respiratory: Normal respiratory effort, no problems with respiration noted  Abdomen: Soft, gravid, appropriate for gestational age.  Pain/Pressure: Present     Pelvic: Cervical exam performed Dilation: 4.5 Effacement (%): 70 Station: -3  Extremities: Normal range of motion.  Edema: Trace  Mental Status: Normal mood and affect. Normal behavior. Normal judgment and thought content.   Assessment and Plan:  Pregnancy: [id number] at [redacted]w[redacted]d  1. Supervision of other normal pregnancy, antepartum Patient is doing well without complaints  2. Previous cesarean delivery affecting pregnancy Scheduled for repeat on 3/1 with BTL  3. Rubella non-immune status, antepartum Will offer pp  Term labor symptoms and general obstetric precautions including but not limited to vaginal bleeding,  contractions, leaking of fluid and fetal movement were reviewed in detail with the patient. Please refer to After Visit Summary for other counseling recommendations.  Return in about 1 week (around 11/12/2018) for ROB.  Future Appointments  Date Time Provider Department Center  11/16/2018  9:15 AM [facility] PAT 1 [facility] None    Catalina Antigua, MD

## 2018-11-05 NOTE — Progress Notes (Signed)
ROB.  C/o pressure and contractions.

## 2018-11-06 ENCOUNTER — Inpatient Hospital Stay (HOSPITAL_COMMUNITY)
Admission: AD | Admit: 2018-11-06 | Discharge: 2018-11-07 | DRG: 807 | Disposition: A | Discharge status: Home / Self Care | Payer: Medicaid Other | Attending: Family Medicine | Admitting: Family Medicine

## 2018-11-06 ENCOUNTER — Inpatient Hospital Stay (HOSPITAL_COMMUNITY): Payer: Medicaid Other | Admitting: Anesthesiology

## 2018-11-06 ENCOUNTER — Encounter (HOSPITAL_COMMUNITY): Payer: Self-pay | Admitting: *Deleted

## 2018-11-06 ENCOUNTER — Encounter (HOSPITAL_COMMUNITY): Payer: Self-pay | Admitting: Anesthesiology

## 2018-11-06 DIAGNOSIS — O34211 Maternal care for low transverse scar from previous cesarean delivery: Secondary | ICD-10-CM | POA: Diagnosis present

## 2018-11-06 DIAGNOSIS — Z283 Underimmunization status: Secondary | ICD-10-CM

## 2018-11-06 DIAGNOSIS — Z3483 Encounter for supervision of other normal pregnancy, third trimester: Secondary | ICD-10-CM | POA: Diagnosis present

## 2018-11-06 DIAGNOSIS — O34219 Maternal care for unspecified type scar from previous cesarean delivery: Secondary | ICD-10-CM

## 2018-11-06 DIAGNOSIS — O99214 Obesity complicating childbirth: Secondary | ICD-10-CM | POA: Diagnosis present

## 2018-11-06 DIAGNOSIS — Z3A37 37 weeks gestation of pregnancy: Secondary | ICD-10-CM

## 2018-11-06 DIAGNOSIS — O09899 Supervision of other high risk pregnancies, unspecified trimester: Secondary | ICD-10-CM

## 2018-11-06 DIAGNOSIS — O9989 Other specified diseases and conditions complicating pregnancy, childbirth and the puerperium: Secondary | ICD-10-CM

## 2018-11-06 DIAGNOSIS — Z3480 Encounter for supervision of other normal pregnancy, unspecified trimester: Secondary | ICD-10-CM

## 2018-11-06 DIAGNOSIS — Z3490 Encounter for supervision of normal pregnancy, unspecified, unspecified trimester: Secondary | ICD-10-CM

## 2018-11-06 DIAGNOSIS — Z2839 Other underimmunization status: Secondary | ICD-10-CM

## 2018-11-06 DIAGNOSIS — E669 Obesity, unspecified: Secondary | ICD-10-CM | POA: Diagnosis present

## 2018-11-06 LAB — CBC
HCT: 34.1 % — ABNORMAL LOW (ref 36.0–46.0)
Hemoglobin: 10.5 g/dL — ABNORMAL LOW (ref 12.0–15.0)
MCH: 26 pg (ref 26.0–34.0)
MCHC: 30.8 g/dL (ref 30.0–36.0)
MCV: 84.4 fL (ref 80.0–100.0)
PLATELETS: 269 10*3/uL (ref 150–400)
RBC: 4.04 MIL/uL (ref 3.87–5.11)
RDW: 15.5 % (ref 11.5–15.5)
WBC: 12.2 10*3/uL — ABNORMAL HIGH (ref 4.0–10.5)
nRBC: 0 % (ref 0.0–0.2)

## 2018-11-06 LAB — RPR: RPR Ser Ql: NONREACTIVE

## 2018-11-06 LAB — TYPE AND SCREEN
ABO/RH(D): A POS
Antibody Screen: NEGATIVE

## 2018-11-06 MED ORDER — WITCH HAZEL-GLYCERIN EX PADS: 1.0000 "application " | MEDICATED_PAD | CUTANEOUS | Status: DC | PRN

## 2018-11-06 MED ORDER — PRENATAL MULTIVITAMIN CH
1.0000 | ORAL_TABLET | Freq: Every day | ORAL | Status: DC
  Administered 2018-11-06: 1 via ORAL
  Filled 2018-11-06: qty 1

## 2018-11-06 MED ORDER — SOD CITRATE-CITRIC ACID 500-334 MG/5ML PO SOLN: 30.0000 mL | ORAL | Status: DC | PRN

## 2018-11-06 MED ORDER — LIDOCAINE HCL (PF) 1 % IJ SOLN
30.0000 mL | INTRAMUSCULAR | Status: DC | PRN
  Filled 2018-11-06: qty 30

## 2018-11-06 MED ORDER — ZOLPIDEM TARTRATE 5 MG PO TABS: 5.0000 mg | ORAL_TABLET | Freq: Every evening | ORAL | Status: DC | PRN

## 2018-11-06 MED ORDER — MAGNESIUM HYDROXIDE 400 MG/5ML PO SUSP: 30.0000 mL | ORAL | Status: DC | PRN

## 2018-11-06 MED ORDER — FENTANYL-BUPIVACAINE-NACL 0.5-0.125-0.9 MG/250ML-% EP SOLN
12.0000 mL/h | EPIDURAL | Status: DC | PRN
  Administered 2018-11-06: 10 mL/h via EPIDURAL
  Filled 2018-11-06 (×2): qty 250

## 2018-11-06 MED ORDER — LACTATED RINGERS IV SOLN: 500.0000 mL | Freq: Once | INTRAVENOUS | Status: DC

## 2018-11-06 MED ORDER — MEASLES, MUMPS & RUBELLA VAC IJ SOLR
0.5000 mL | Freq: Once | INTRAMUSCULAR | Status: DC
  Filled 2018-11-06: qty 0.5

## 2018-11-06 MED ORDER — OXYCODONE-ACETAMINOPHEN 5-325 MG PO TABS: 2.0000 | ORAL_TABLET | ORAL | Status: DC | PRN

## 2018-11-06 MED ORDER — LACTATED RINGERS IV SOLN: 500.0000 mL | INTRAVENOUS | Status: DC | PRN

## 2018-11-06 MED ORDER — OXYTOCIN BOLUS FROM INFUSION
500.0000 mL | Freq: Once | INTRAVENOUS | Status: AC
  Administered 2018-11-06: 500 mL via INTRAVENOUS

## 2018-11-06 MED ORDER — EPHEDRINE 5 MG/ML INJ
10.0000 mg | INTRAVENOUS | Status: DC | PRN
  Filled 2018-11-06: qty 2

## 2018-11-06 MED ORDER — LACTATED RINGERS IV SOLN
INTRAVENOUS | Status: DC
  Administered 2018-11-06: 08:00:00 via INTRAVENOUS

## 2018-11-06 MED ORDER — DIPHENHYDRAMINE HCL 50 MG/ML IJ SOLN: 12.5000 mg | INTRAMUSCULAR | Status: DC | PRN

## 2018-11-06 MED ORDER — OXYCODONE-ACETAMINOPHEN 5-325 MG PO TABS
2.0000 | ORAL_TABLET | ORAL | Status: DC | PRN
  Administered 2018-11-06: 2 via ORAL
  Filled 2018-11-06: qty 2

## 2018-11-06 MED ORDER — ONDANSETRON HCL 4 MG/2ML IJ SOLN: 4.0000 mg | INTRAMUSCULAR | Status: DC | PRN

## 2018-11-06 MED ORDER — PHENYLEPHRINE 40 MCG/ML (10ML) SYRINGE FOR IV PUSH (FOR BLOOD PRESSURE SUPPORT): 80.0000 ug | PREFILLED_SYRINGE | INTRAVENOUS | Status: DC | PRN

## 2018-11-06 MED ORDER — DIPHENHYDRAMINE HCL 25 MG PO CAPS: 25.0000 mg | ORAL_CAPSULE | Freq: Four times a day (QID) | ORAL | Status: DC | PRN

## 2018-11-06 MED ORDER — BENZOCAINE-MENTHOL 20-0.5 % EX AERO
1.0000 "application " | INHALATION_SPRAY | CUTANEOUS | Status: DC | PRN
  Administered 2018-11-06: 1 via TOPICAL
  Filled 2018-11-06: qty 56

## 2018-11-06 MED ORDER — OXYCODONE-ACETAMINOPHEN 5-325 MG PO TABS: 1.0000 | ORAL_TABLET | ORAL | Status: DC | PRN

## 2018-11-06 MED ORDER — TETANUS-DIPHTH-ACELL PERTUSSIS 5-2.5-18.5 LF-MCG/0.5 IM SUSP: 0.5000 mL | Freq: Once | INTRAMUSCULAR | Status: DC

## 2018-11-06 MED ORDER — LIDOCAINE HCL (PF) 1 % IJ SOLN
INTRAMUSCULAR | Status: DC | PRN
  Administered 2018-11-06: 3 mL via EPIDURAL
  Administered 2018-11-06: 4 mL via EPIDURAL

## 2018-11-06 MED ORDER — COCONUT OIL OIL: 1.0000 "application " | TOPICAL_OIL | Status: DC | PRN

## 2018-11-06 MED ORDER — ACETAMINOPHEN 325 MG PO TABS: 650.0000 mg | ORAL_TABLET | ORAL | Status: DC | PRN

## 2018-11-06 MED ORDER — ONDANSETRON HCL 4 MG PO TABS: 4.0000 mg | ORAL_TABLET | ORAL | Status: DC | PRN

## 2018-11-06 MED ORDER — IBUPROFEN 600 MG PO TABS
600.0000 mg | ORAL_TABLET | Freq: Four times a day (QID) | ORAL | Status: DC
  Administered 2018-11-06 – 2018-11-07 (×4): 600 mg via ORAL
  Filled 2018-11-06 (×4): qty 1

## 2018-11-06 MED ORDER — PHENYLEPHRINE 40 MCG/ML (10ML) SYRINGE FOR IV PUSH (FOR BLOOD PRESSURE SUPPORT)
80.0000 ug | PREFILLED_SYRINGE | INTRAVENOUS | Status: DC | PRN
  Filled 2018-11-06 (×2): qty 10

## 2018-11-06 MED ORDER — SIMETHICONE 80 MG PO CHEW: 80.0000 mg | CHEWABLE_TABLET | ORAL | Status: DC | PRN

## 2018-11-06 MED ORDER — FERROUS SULFATE 325 (65 FE) MG PO TABS
325.0000 mg | ORAL_TABLET | Freq: Two times a day (BID) | ORAL | Status: DC
  Administered 2018-11-06 – 2018-11-07 (×2): 325 mg via ORAL
  Filled 2018-11-06 (×2): qty 1

## 2018-11-06 MED ORDER — FENTANYL-BUPIVACAINE-NACL 0.5-0.125-0.9 MG/250ML-% EP SOLN: 12.0000 mL/h | EPIDURAL | Status: DC | PRN

## 2018-11-06 MED ORDER — OXYTOCIN 40 UNITS IN NORMAL SALINE INFUSION - SIMPLE MED
2.5000 [IU]/h | INTRAVENOUS | Status: DC
  Filled 2018-11-06: qty 1000

## 2018-11-06 MED ORDER — FENTANYL CITRATE (PF) 100 MCG/2ML IJ SOLN
50.0000 ug | INTRAMUSCULAR | Status: DC | PRN
  Administered 2018-11-06: 100 ug via INTRAVENOUS
  Filled 2018-11-06: qty 2

## 2018-11-06 MED ORDER — ONDANSETRON HCL 4 MG/2ML IJ SOLN: 4.0000 mg | Freq: Four times a day (QID) | INTRAMUSCULAR | Status: DC | PRN

## 2018-11-06 MED ORDER — PHENYLEPHRINE 40 MCG/ML (10ML) SYRINGE FOR IV PUSH (FOR BLOOD PRESSURE SUPPORT)
80.0000 ug | PREFILLED_SYRINGE | INTRAVENOUS | Status: DC | PRN
  Filled 2018-11-06: qty 10

## 2018-11-06 MED ORDER — DIBUCAINE 1 % RE OINT: 1.0000 "application " | TOPICAL_OINTMENT | RECTAL | Status: DC | PRN

## 2018-11-06 NOTE — Discharge Summary (Signed)
Postpartum Discharge Summary   Patient Name: [name] [name] DOB: [birth year] MRN: [id number]  Date of admission: 11/06/2018 Delivering Provider: Levie Heritage   Date of discharge: 11/07/2018  Admitting diagnosis: ctx Intrauterine pregnancy: [redacted]w[redacted]d     Secondary diagnosis:  Active Problems:   Previous cesarean delivery affecting pregnancy   Rubella non-immune status, antepartum   Indication for care in labor or delivery   Normal labor and delivery  Additional problems: none     Discharge diagnosis: Term Pregnancy Delivered and VBAC         Post partum procedures:none Augmentation: AROM Complications: None  Hospital course:  Onset of Labor With VBAC     33 y.o. yo [id number] at [redacted]w[redacted]d was admitted in Active Labor on 11/06/2018. Was planning for an elective repeat LTCS but admitted in active labor and amenable to [facility]. AROM for augmentation. Patient had an uncomplicated labor course as follows: Membrane Rupture Time/Date: 7:48 AM ,11/06/2018 Intrapartum Procedures: Episiotomy: None [1] None.  Lacerations:  None [1] None. Patient had a delivery of a Viable infant. 11/06/2018  Information for the patient's newborn:  [name], [name] [[id number]]  Delivery Method: Vag-Spont  Patient had an uncomplicated postpartum course.  She is ambulating, tolerating a regular diet, passing flatus, and urinating well. Patient is discharged home in stable condition on 11/07/18.  Magnesium Sulfate recieved: No BMZ received: No  Physical exam  Vitals:   11/06/18 1615 11/06/18 2000 11/06/18 2300 11/07/18 0523  BP: 119/71 121/72 117/77 109/67  Pulse: 82 81 83 79  Resp: 18 18 17 18   Temp: 98.5 F (36.9 C) 98.1 F (36.7 C) 98.1 F (36.7 C) 98.1 F (36.7 C)  TempSrc: Oral Oral Oral Oral  SpO2:  100%  99%  Weight:      Height:       General: alert, cooperative and no distress Lochia: appropriate Uterine Fundus: firm Incision: N/A DVT Evaluation: No significant calf/ankle edema. Labs: Lab Results   Component Value Date   WBC 12.2 (H) 11/06/2018   HGB 10.5 (L) 11/06/2018   HCT 34.1 (L) 11/06/2018   MCV 84.4 11/06/2018   PLT 269 11/06/2018   CMP Latest Ref Rng & Units 07/24/2018  Glucose 70 - 99 mg/dL 88  BUN 6 - 20 mg/dL 10  Creatinine 8.33 - 8.25 mg/dL 0.53  Sodium 976 - 734 mmol/L 134(L)  Potassium 3.5 - 5.1 mmol/L 3.5  Chloride 98 - 111 mmol/L 103  CO2 22 - 32 mmol/L 23  Calcium 8.9 - 10.3 mg/dL 1.9(F)  Total Protein 6.5 - 8.1 g/dL 6.7  Total Bilirubin 0.3 - 1.2 mg/dL 0.6  Alkaline Phos 38 - 126 U/L 62  AST 15 - 41 U/L 16  ALT 0 - 44 U/L 17    Discharge instruction: per After Visit Summary and "Baby and Me Booklet".  After visit meds:  Allergies as of 11/07/2018      Reactions   Oseltamivir Phosphate Rash      Medication List    STOP taking these medications   acetaminophen 500 MG tablet Commonly known as:  TYLENOL   COMFORT FIT MATERNITY SUPP MED Misc     TAKE these medications   albuterol 108 (90 Base) MCG/ACT inhaler Commonly known as:  PROVENTIL HFA;VENTOLIN HFA Inhale 2 puffs into the lungs every 6 (six) hours as needed for wheezing or shortness of breath.   ferrous sulfate 325 (65 FE) MG tablet Commonly known as:  FERROUSUL Take 1 tablet (325 mg total) by mouth  2 (two) times daily.   ibuprofen 600 MG tablet Commonly known as:  ADVIL,MOTRIN Take 1 tablet (600 mg total) by mouth 3 (three) times daily.   senna 8.6 MG Tabs tablet Commonly known as:  SENOKOT Take 1 tablet (8.6 mg total) by mouth daily.   VITAFOL ULTRA 29-0.6-0.4-200 MG Caps Take 1 capsule by mouth daily before breakfast.      Diet: Regular Activity: Advance as tolerated. Pelvic rest for 6 weeks.   Outpatient follow up:4 weeks Follow up Appt: Future Appointments  Date Time Provider Department Center  11/16/2018  9:15 AM [facility] PAT 1 [facility] None  12/06/2018  1:00 PM Constant, Gigi Gin, MD [facility] None   Follow up Visit: Please schedule this patient for Postpartum visit in: 4  weeks with the following provider: Any provider For C/S patients schedule nurse incision check in weeks 2 weeks: NA Low risk pregnancy complicated by: Nothing Delivery mode:  SVD Anticipated Birth Control:  other/unsure PP Procedures needed: nothing  Schedule Integrated BH visit: no  Newborn Data: Live born female  Birth Weight:  6lb 7.4oz APGAR: 9,9   Newborn Delivery   Birth date/time:  11/06/2018 09:22:00 Delivery type:      Baby Feeding: Bottle Disposition:home with mother  Jules Schick, DO [facility], PGY-2  Attestation: I have seen this patient and agree with the resident's documentation. I have examined them separately, and we have discussed the plan of care.  Cristal Deer. Earlene Plater, DO OB/GYN Fellow

## 2018-11-06 NOTE — Anesthesia Preprocedure Evaluation (Deleted)
Anesthesia Evaluation  Patient identified by MRN, date of birth, ID band Patient awake    Reviewed: Allergy & Precautions, Patient's Chart, lab work & pertinent test results  Airway Mallampati: II  TM Distance: >3 FB Neck ROM: Full    Dental no notable dental hx. (+) Teeth Intact   Pulmonary neg pulmonary ROS,    Pulmonary exam normal breath sounds clear to auscultation       Cardiovascular negative cardio ROS Normal cardiovascular exam Rhythm:Regular Rate:Normal     Neuro/Psych negative neurological ROS  negative psych ROS   GI/Hepatic Neg liver ROS, GERD  ,  Endo/Other  Obesity   Renal/GU negative Renal ROS  negative genitourinary   Musculoskeletal negative musculoskeletal ROS (+)   Abdominal (+) + obese,   Peds  Hematology  (+) anemia ,   Anesthesia Other Findings   Reproductive/Obstetrics (+) Pregnancy Previous C/Section AMA                             Anesthesia Physical Anesthesia Plan  ASA: II  Anesthesia Plan: Epidural   Post-op Pain Management:    Induction:   PONV Risk Score and Plan:   Airway Management Planned: Natural Airway  Additional Equipment:   Intra-op Plan:   Post-operative Plan:   Informed Consent: I have reviewed the patients History and Physical, chart, labs and discussed the procedure including the risks, benefits and alternatives for the proposed anesthesia with the patient or authorized representative who has indicated his/her understanding and acceptance.     Dental advisory given  Plan Discussed with: CRNA and Surgeon  Anesthesia Plan Comments:         Anesthesia Quick Evaluation

## 2018-11-06 NOTE — Anesthesia Postprocedure Evaluation (Signed)
Anesthesia Post Note  Patient: [name] [name]  Procedure(s) Performed: AN AD HOC LABOR EPIDURAL     Patient location during evaluation: Mother Baby Anesthesia Type: Epidural Level of consciousness: awake and alert Pain management: pain level controlled Vital Signs Assessment: post-procedure vital signs reviewed and stable Respiratory status: spontaneous breathing Cardiovascular status: stable Postop Assessment: no headache, patient able to bend at knees, no backache, no apparent nausea or vomiting, epidural receding, adequate PO intake and able to ambulate Anesthetic complications: no    Last Vitals:  Vitals:   11/06/18 1100 11/06/18 1200  BP: 117/77 118/76  Pulse: 85 89  Resp: 17 18  Temp: 37.2 C 37.2 C  SpO2:      Last Pain:  Vitals:   11/06/18 1200  TempSrc: Oral  PainSc: 0-No pain   Pain Goal:                   Salome Arnt

## 2018-11-06 NOTE — Anesthesia Preprocedure Evaluation (Addendum)
Anesthesia Evaluation  Patient identified by MRN, date of birth, ID band Patient awake    Reviewed: Allergy & Precautions, Patient's Chart, lab work & pertinent test results  Airway Mallampati: II  TM Distance: >3 FB Neck ROM: Full    Dental no notable dental hx. (+) Teeth Intact   Pulmonary neg pulmonary ROS,    Pulmonary exam normal breath sounds clear to auscultation       Cardiovascular negative cardio ROS Normal cardiovascular exam Rhythm:Regular Rate:Normal     Neuro/Psych negative neurological ROS  negative psych ROS   GI/Hepatic Neg liver ROS, GERD  ,  Endo/Other  Obesity   Renal/GU negative Renal ROS  negative genitourinary   Musculoskeletal negative musculoskeletal ROS (+)   Abdominal (+) + obese,   Peds  Hematology  (+) anemia ,   Anesthesia Other Findings   Reproductive/Obstetrics (+) Pregnancy Previous C/Section AMA                             Anesthesia Physical Anesthesia Plan  ASA: II  Anesthesia Plan: Epidural   Post-op Pain Management:    Induction:   PONV Risk Score and Plan:   Airway Management Planned: Natural Airway  Additional Equipment:   Intra-op Plan:   Post-operative Plan:   Informed Consent: I have reviewed the patients History and Physical, chart, labs and discussed the procedure including the risks, benefits and alternatives for the proposed anesthesia with the patient or authorized representative who has indicated his/her understanding and acceptance.     Dental advisory given  Plan Discussed with: CRNA and Surgeon  Anesthesia Plan Comments:         Anesthesia Quick Evaluation  

## 2018-11-06 NOTE — H&P (Addendum)
LABOR AND DELIVERY ADMISSION HISTORY AND PHYSICAL NOTE  [name] [name] is a 33 y.o. female [phone] with IUP at [redacted]w[redacted]d by LMP presenting for spontaneous onset of labor.  She reports positive fetal movement. She denies leakage of fluid or vaginal bleeding.  Prenatal History/Complications: PNC at [facility] at 11w Pregnancy complications:  - Rubella non-immune - Previous cesarean delivery, desires TOLAC since in active labor   Past Medical History: Past Medical History:  Diagnosis Date  . Gonorrhea     Past Surgical History: Past Surgical History:  Procedure Laterality Date  . CESAREAN SECTION  05/09/2012   Procedure: CESAREAN SECTION;  Surgeon: Antionette Char, MD;  Location: [facility];  Service: Gynecology;  Laterality: N/A;  . DILATION AND CURETTAGE OF UTERUS    . WISDOM TOOTH EXTRACTION      Obstetrical History: OB History    Gravida  11   Para  4   Term  4   Preterm  0   AB  6   Living  4     SAB  1   TAB  4   Ectopic  1   Multiple  0   Live Births  4        Obstetric Comments  SAB x 1. Ectopic x 1. TAB x 4.         Social History: Social History   Socioeconomic History  . Marital status: Single    Spouse name: Not on file  . Number of children: Not on file  . Years of education: Not on file  . Highest education level: Not on file  Occupational History  . Not on file  Social Needs  . Financial resource strain: Not hard at all  . Food insecurity:    Worry: Never true    Inability: Never true  . Transportation needs:    Medical: No    Non-medical: No  Tobacco Use  . Smoking status: Never Smoker  . Smokeless tobacco: Never Used  Substance and Sexual Activity  . Alcohol use: No  . Drug use: No  . Sexual activity: Yes    Birth control/protection: Other-see comments    Comment: pregnant  Lifestyle  . Physical activity:    Days per week: Not on file    Minutes per session: Not on file  . Stress: To some extent  Relationships  . Social  connections:    Talks on phone: Not on file    Gets together: Not on file    Attends religious service: Not on file    Active member of club or organization: Not on file    Attends meetings of clubs or organizations: Not on file    Relationship status: Not on file  Other Topics Concern  . Not on file  Social History Narrative  . Not on file    Family History: Family History  Problem Relation Age of Onset  . Heart disease Mother   . Cancer Father   . Anesthesia problems Neg Hx   . Other Neg Hx     Allergies: Allergies  Allergen Reactions  . Oseltamivir Phosphate Rash    Medications Prior to Admission  Medication Sig Dispense Refill Last Dose  . acetaminophen (TYLENOL) 500 MG tablet Take 500 mg by mouth every 6 (six) hours as needed.   Not Taking  . albuterol (PROVENTIL HFA;VENTOLIN HFA) 108 (90 Base) MCG/ACT inhaler Inhale 2 puffs into the lungs every 6 (six) hours as needed for wheezing or shortness of  breath. 1 Inhaler 2 Taking  . Elastic Bandages & Supports (COMFORT FIT MATERNITY SUPP MED) MISC Wear daily when ambulating (Patient not taking: Reported on 11/05/2018) 1 each 0 Not Taking  . ferrous sulfate (FERROUSUL) 325 (65 FE) MG tablet Take 1 tablet (325 mg total) by mouth 2 (two) times daily. (Patient not taking: Reported on 10/29/2018) 60 tablet 1 Not Taking  . Prenat-Fe Poly-Methfol-FA-DHA (VITAFOL ULTRA) 29-0.6-0.4-200 MG CAPS Take 1 capsule by mouth daily before breakfast. 90 capsule 4 Taking     Review of Systems  All systems reviewed and negative except as stated in HPI  Physical Exam Blood pressure (!) 143/93, pulse 90, resp. rate 19, last menstrual period 02/17/2018, SpO2 100 %. General appearance: alert, oriented, NAD Lungs: normal respiratory effort Abdomen: gravid  Of note, patient presented in active labor and close to delivery so limited exam   Prenatal labs: ABO, Rh: A/Positive/-- (08/19 0958) Antibody: Negative (08/19 0958) Rubella: <0.90 (08/19  0958) RPR: Non Reactive (12/09 1011)  HBsAg: Negative (08/19 0958)  HIV: Non Reactive (12/09 1011)  GC/Chlamydia: Negative GBS: Negative (02/10 1042)  2-hr GTT: 144, 107 Genetic screening:  AFP screen negative Anatomy US: normal  Prenatal Transfer Tool  Maternal Diabetes: No Genetic Screening: Normal Maternal Ultrasounds/Referrals: Normal Fetal Ultrasounds or other Referrals:  None Maternal Substance Abuse:  No Significant Maternal Medications:  None Significant Maternal Lab Results: None  No results found for this or any previous visit (from the past 24 hour(s)).  Patient Active Problem List   Diagnosis Date Noted  . Indication for care in labor or delivery 11/06/2018  . Rubella non-immune status, antepartum 05/17/2018  . Supervision of other normal pregnancy, antepartum 05/07/2018  . Previous cesarean delivery affecting pregnancy 05/07/2018  . left cornual 08/02/2016    Assessment: COMILLA CADET is a 33 y.o. [id number] at [redacted]w[redacted]d here for spontaneous onset of labor  #Labor: AROM and expectant management, anticipate SVD  #Pain: Analgesic with fentanyl as no time to administer Epidural #FWB:  Cat 1 #ID: GBS negative #MOF: Breast #MOC: BTL  [facility], PGY-2 11/06/2018, 7:32 AM   Attestation: I have seen this patient and agree with the resident's documentation. I have examined them separately, and we have discussed the plan of care.  Cristal Deer. Earlene Plater, DO OB/GYN Fellow

## 2018-11-06 NOTE — Anesthesia Procedure Notes (Signed)
Epidural Patient location during procedure: OB Start time: 11/06/2018 9:13 AM End time: 11/06/2018 9:20 AM  Staffing Anesthesiologist: Mal Amabile, MD Performed: anesthesiologist   Preanesthetic Checklist Completed: patient identified, site marked, surgical consent, pre-op evaluation, timeout performed, IV checked, risks and benefits discussed and monitors and equipment checked  Epidural Patient position: sitting Prep: site prepped and draped and DuraPrep Patient monitoring: continuous pulse ox and blood pressure Approach: midline Location: L3-L4 Injection technique: LOR air  Needle:  Needle type: Tuohy  Needle gauge: 17 G Needle length: 9 cm and 9 Needle insertion depth: 4 cm Catheter type: closed end flexible Catheter size: 19 Gauge Catheter at skin depth: 9 cm Test dose: negative and Other  Assessment Events: blood not aspirated, injection not painful, no injection resistance, negative IV test and no paresthesia  Additional Notes Patient identified. Risks and benefits discussed including failed block, incomplete  Pain control, post dural puncture headache, nerve damage, paralysis, blood pressure Changes, nausea, vomiting, reactions to medications-both toxic and allergic and post Partum back pain. All questions were answered. Patient expressed understanding and wished to proceed. Sterile technique was used throughout procedure. Epidural site was Dressed with sterile barrier dressing. No paresthesias, signs of intravascular injection Or signs of intrathecal spread were encountered.  Patient was more comfortable after the epidural was dosed. Please see RN's note for documentation of vital signs and FHR which are stable.

## 2018-11-06 NOTE — MAU Note (Signed)
Pt assisted to room 6 via w/c from lobby with urge to push, SVE 9/0 station/vertex, call to resident for faculty practice and L/D, pt taken per stretcher with Weinhold,CNM to room 174

## 2018-11-07 MED ORDER — SENNA 8.6 MG PO TABS: 1.0000 | ORAL_TABLET | Freq: Every day | ORAL | 0 refills | Status: DC

## 2018-11-07 MED ORDER — IBUPROFEN 600 MG PO TABS: 600.0000 mg | ORAL_TABLET | Freq: Three times a day (TID) | ORAL | 0 refills | Status: DC

## 2018-11-07 NOTE — Discharge Instructions (Signed)
Vaginal Delivery, Care After °Refer to this sheet in the next few weeks. These instructions provide you with information about caring for yourself after vaginal delivery. Your health care provider may also give you more specific instructions. Your treatment has been planned according to current medical practices, but problems sometimes occur. Call your health care provider if you have any problems or questions. °What can I expect after the procedure? °After vaginal delivery, it is common to have: °· Some bleeding from your vagina. °· Soreness in your abdomen, your vagina, and the area of skin between your vaginal opening and your anus (perineum). °· Pelvic cramps. °· Fatigue. °Follow these instructions at home: °Medicines °· Take over-the-counter and prescription medicines only as told by your health care provider. °· If you were prescribed an antibiotic medicine, take it as told by your health care provider. Do not stop taking the antibiotic until it is finished. °Driving ° °· Do not drive or operate heavy machinery while taking prescription pain medicine. °· Do not drive for 24 hours if you received a sedative. °Lifestyle °· Do not drink alcohol. This is especially important if you are breastfeeding or taking medicine to relieve pain. °· Do not use tobacco products, including cigarettes, chewing tobacco, or e-cigarettes. If you need help quitting, ask your health care provider. °Eating and drinking °· Drink at least 8 eight-ounce glasses of water every day unless you are told not to by your health care provider. If you choose to breastfeed your baby, you may need to drink more water than this. °· Eat high-fiber foods every day. These foods may help prevent or relieve constipation. High-fiber foods include: °? Whole grain cereals and breads. °? Brown rice. °? Beans. °? Fresh fruits and vegetables. °Activity °· Return to your normal activities as told by your health care provider. Ask your health care provider what  activities are safe for you. °· Rest as much as possible. Try to rest or take a nap when your baby is sleeping. °· Do not lift anything that is heavier than your baby or 10 lb (4.5 kg) until your health care provider says that it is safe. °· Talk with your health care provider about when you can engage in sexual activity. This may depend on your: °? Risk of infection. °? Rate of healing. °? Comfort and desire to engage in sexual activity. °Vaginal Care °· If you have an episiotomy or a vaginal tear, check the area every day for signs of infection. Check for: °? More redness, swelling, or pain. °? More fluid or blood. °? Warmth. °? Pus or a bad smell. °· Do not use tampons or douches until your health care provider says this is safe. °· Watch for any blood clots that may pass from your vagina. These may look like clumps of dark red, brown, or black discharge. °General instructions °· Keep your perineum clean and dry as told by your health care provider. °· Wear loose, comfortable clothing. °· Wipe from front to back when you use the toilet. °· Ask your health care provider if you can shower or take a bath. If you had an episiotomy or a perineal tear during labor and delivery, your health care provider may tell you not to take baths for a certain length of time. °· Wear a bra that supports your breasts and fits you well. °· If possible, have someone help you with household activities and help care for your baby for at least a few days after you   leave the hospital. °· Keep all follow-up visits for you and your baby as told by your health care provider. This is important. °Contact a health care provider if: °· You have: °? Vaginal discharge that has a bad smell. °? Difficulty urinating. °? Pain when urinating. °? A sudden increase or decrease in the frequency of your bowel movements. °? More redness, swelling, or pain around your episiotomy or vaginal tear. °? More fluid or blood coming from your episiotomy or vaginal  tear. °? Pus or a bad smell coming from your episiotomy or vaginal tear. °? A fever. °? A rash. °? Little or no interest in activities you used to enjoy. °? Questions about caring for yourself or your baby. °· Your episiotomy or vaginal tear feels warm to the touch. °· Your episiotomy or vaginal tear is separating or does not appear to be healing. °· Your breasts are painful, hard, or turn red. °· You feel unusually sad or worried. °· You feel nauseous or you vomit. °· You pass large blood clots from your vagina. If you pass a blood clot from your vagina, save it to show to your health care provider. Do not flush blood clots down the toilet without having your health care provider look at them. °· You urinate more than usual. °· You are dizzy or light-headed. °· You have not breastfed at all and you have not had a menstrual period for 12 weeks after delivery. °· You have stopped breastfeeding and you have not had a menstrual period for 12 weeks after you stopped breastfeeding. °Get help right away if: °· You have: °? Pain that does not go away or does not get better with medicine. °? Chest pain. °? Difficulty breathing. °? Blurred vision or spots in your vision. °? Thoughts about hurting yourself or your baby. °· You develop pain in your abdomen or in one of your legs. °· You develop a severe headache. °· You faint. °· You bleed from your vagina so much that you fill two sanitary pads in one hour. °This information is not intended to replace advice given to you by your health care provider. Make sure you discuss any questions you have with your health care provider. ° ° °Breastfeeding and Cracked or Sore Nipples °It is normal to have some tenderness in your nipples when you start to breastfeed your new baby. Your nipples can also become cracked or sore. This may happen if your baby or the baby's mouth is not in the right position when breastfeeding. There are things you can do to help avoid these problems. °Follow  these instructions at home: °Breastfeeding strategy ° °· Make sure your baby's mouth attaches to your nipple (latches) properly to breastfeed. °· Make sure your baby is in the right position when breastfeeding. Try different positions to find one that works. °· Have your baby feed from the less sore breast first. °· Break the latch between your baby's mouth and your nipple before removing him or her from your breast. To do this: °? Put your little finger in between your nipple and your baby's gums. °If you use a breast pump: °· Make sure the part of the pump that goes over your nipple fits properly. °· Start by setting the pump to a low setting. Increase the pump strength over time as needed. Too high of a setting may cause damage to your nipple. °Breast care °To help your breasts and nipples stay healthy: °· Avoid the use of soap on   your nipples. °· Wear a supportive bra. Avoid wearing underwire bras or tight bras. °· Air-dry your nipples for 3-4 minutes after each feeding. °· Use only cotton bra pads to soak up any breast milk that leaks. Be sure to change the pads if they become soaked with milk. °· Put some lanolin on your nipples after breastfeeding. Pure lanolin does not need to be washed off your nipple before you feed your baby again. Pure lanolin is not harmful to your baby. °· Rub some breast milk into your nipples: °? Use your hand to squeeze out a few drops of breast milk. °? Gently massage the milk into your nipples. °? Let your nipples air-dry. °Contact a doctor if: °· You have nipple pain. °· You have soreness or cracking that lasts more than 1 week. °Summary °· It is normal to have some tenderness in your nipples when you start to breastfeed your new baby. But you should contact your doctor if you have nipple pain. °· Your nipples can become cracked or sore if your baby's mouth does not attach to your nipple properly when breastfeeding. °· Rub lanolin or breast milk onto your nipples to keep them  from getting cracked or sore. Avoid washing your nipples with soap. °This information is not intended to replace advice given to you by your health care provider. Make sure you discuss any questions you have with your health care provider. °Document Released: 04/13/2017 Document Revised: 04/13/2017 Document Reviewed: 04/13/2017 °Elsevier Interactive Patient Education © 2019 Elsevier Inc. ° °Document Released: 09/02/2000 Document Revised: 02/17/2016 Document Reviewed: 09/20/2015 °Elsevier Interactive Patient Education © 2019 Elsevier Inc. ° °

## 2018-11-11 ENCOUNTER — Encounter (HOSPITAL_COMMUNITY): Payer: Self-pay | Admitting: Obstetrics & Gynecology

## 2018-11-12 ENCOUNTER — Encounter: Payer: Medicaid Other | Admitting: Advanced Practice Midwife

## 2018-11-16 ENCOUNTER — Encounter (HOSPITAL_COMMUNITY)
Admission: RE | Admit: 2018-11-16 | Discharge: 2018-11-16 | Disposition: A | Discharge status: Home / Self Care | Payer: Medicaid Other | Source: Ambulatory Visit

## 2018-11-18 ENCOUNTER — Inpatient Hospital Stay (HOSPITAL_COMMUNITY)
Admission: RE | Admit: 2018-11-18 | Payer: Medicaid Other | Source: Ambulatory Visit | Admitting: Obstetrics & Gynecology

## 2018-11-18 ENCOUNTER — Encounter (HOSPITAL_COMMUNITY): Admission: RE | Payer: Self-pay | Source: Ambulatory Visit

## 2018-11-18 HISTORY — DX: Other ectopic pregnancy without intrauterine pregnancy: O00.80

## 2018-11-18 SURGERY — Surgical Case
Anesthesia: Choice | Laterality: Bilateral

## 2018-12-06 ENCOUNTER — Ambulatory Visit: Payer: Medicaid Other | Admitting: Obstetrics and Gynecology

## 2018-12-07 ENCOUNTER — Telehealth: Payer: Self-pay | Admitting: *Deleted

## 2018-12-07 NOTE — Telephone Encounter (Signed)
I called patient to reschedule a missed appt no answer left voicemail

## 2018-12-19 ENCOUNTER — Encounter: Payer: Self-pay | Admitting: Obstetrics and Gynecology

## 2018-12-19 ENCOUNTER — Ambulatory Visit (INDEPENDENT_AMBULATORY_CARE_PROVIDER_SITE_OTHER): Payer: Medicaid Other | Admitting: Obstetrics and Gynecology

## 2018-12-19 ENCOUNTER — Other Ambulatory Visit: Payer: Self-pay

## 2018-12-19 DIAGNOSIS — Z1389 Encounter for screening for other disorder: Secondary | ICD-10-CM | POA: Diagnosis not present

## 2018-12-19 MED ORDER — DROSPIRENONE 4 MG PO TABS: 1.0000 | ORAL_TABLET | Freq: Every day | ORAL | 4 refills | Status: DC

## 2018-12-19 NOTE — Progress Notes (Signed)
    TELEHEALTH VIRTUAL POSTPARTUM VISIT ENCOUNTER NOTE  I connected with@ on 12/19/18 at  3:15 PM EDT by telephone at home and verified that I am speaking with the correct person using two identifiers.   I discussed the limitations, risks, security and privacy concerns of performing an evaluation and management service by telephone and the availability of in person appointments. I also discussed with the patient that there may be a patient responsible charge related to this service. The patient expressed understanding and agreed to proceed.  Post Partum Exam  [name] [name] is a 33 y.o. [id number] female who presents for a postpartum visit. She is 6 weeks postpartum following a spontaneous vaginal delivery. I have fully reviewed the prenatal and intrapartum course. The delivery was at 37.2 gestational weeks.  Anesthesia: epidural. Postpartum course has been uncomplicated. Baby's course has been uncomplicated. Baby is feeding by bottle - Enfamil-Gentlease. Bleeding no bleeding. Bowel function is normal. Bladder function is normal. Patient is sexually active. Last Intercourse 7-8 days ago. Contraception method is none.  Postpartum depression screening:neg, score 0.    Last pap smear done 04/2018 and was Normal  Review of Systems Pertinent items are noted in HPI.       Past Medical History:  Diagnosis Date  . Gonorrhea   . Left cornual ectopic treated with methotrexate therapy 08/02/2016   Past Surgical History:  Procedure Laterality Date  . CESAREAN SECTION  05/09/2012   Procedure: CESAREAN SECTION;  Surgeon: Antionette Char, MD;  Location: [facility];  Service: Gynecology;  Laterality: N/A;  . DILATION AND CURETTAGE OF UTERUS    . WISDOM TOOTH EXTRACTION     The following portions of the patient's history were reviewed and updated as appropriate: allergies, current medications, past family history, past medical history, past social history, past surgical history and problem list.    Physical Exam:  Physical exam deferred due to nature of the encounter  Labs and Imaging No results found for this or any previous visit (from the past 336 hour(s)). No results found.    Assessment and Plan:     1. Routine postpartum follow-up Patient is medically cleared to return to her regular activities Patient opted for POP for contraception Patient to return in the fall for annual exam with pap smear       I discussed the assessment and treatment plan with the patient. The patient was provided an opportunity to ask questions and all were answered. The patient agreed with the plan and demonstrated an understanding of the instructions.   The patient was advised to call back or seek an in-person evaluation/go to the ED if the symptoms worsen or if the condition fails to improve as anticipated.  I provided 15 minutes of non-face-to-face time during this encounter.   Catalina Antigua, MD [facility] [employer], [facility]

## 2019-01-18 ENCOUNTER — Telehealth: Payer: Self-pay | Admitting: Cardiovascular Disease

## 2019-01-18 NOTE — Telephone Encounter (Signed)
New message     Virtual video visit scheduled on 01-24-19.  Pt gave consent to visit.  YOUR CARDIOLOGY TEAM HAS ARRANGED FOR AN E-VISIT FOR YOUR APPOINTMENT - PLEASE REVIEW IMPORTANT INFORMATION BELOW SEVERAL DAYS PRIOR TO YOUR APPOINTMENT  Due to the recent COVID-19 pandemic, we are transitioning in-person office visits to tele-medicine visits in an effort to decrease unnecessary exposure to our patients, their families, and staff. These visits are billed to your insurance just like a normal visit is. We also encourage you to sign up for MyChart if you have not already done so. You will need a smartphone if possible. For patients that do not have this, we can still complete the visit using a regular telephone but do prefer a smartphone to enable video when possible. You may have a family member that lives with you that can help. If possible, we also ask that you have a blood pressure cuff and scale at home to measure your blood pressure, heart rate and weight prior to your scheduled appointment. Patients with clinical needs that need an in-person evaluation and testing will still be able to come to the office if absolutely necessary. If you have any questions, feel free to call our office.     YOUR PROVIDER WILL BE USING THE FOLLOWING PLATFORM TO COMPLETE YOUR VISIT: Doxy Me   IF USING MYCHART - How to Download the MyChart App to Your SmartPhone   - If Apple, go to [employer] and type in MyChart in the search bar and download the app. If Android, ask patient to go to [employer] and type in [place] in the search bar and download the app. The app is free but as with any other app downloads, your phone may require you to verify saved payment information or Apple/Android password.  - You will need to then log into the app with your MyChart username and password, and select [facility] as your healthcare provider to link the account.  - When it is time for your visit, go to the MyChart app, find  appointments, and click Begin Video Visit. Be sure to Select Allow for your device to access the Microphone and Camera for your visit. You will then be connected, and your provider will be with you shortly.  **If you have any issues connecting or need assistance, please contact MyChart service desk [phone])**  **If using a computer, in order to ensure the best quality for your visit, you will need to use either of the following Internet Browsers: Agricultural consultant or Microsoft Edge**   IF USING DOXIMITY or [email or web address] - The staff will give you instructions on receiving your link to join the meeting the day of your visit.      2-3 DAYS BEFORE YOUR APPOINTMENT  You will receive a telephone call from one of our [facility] team members - your caller ID may say "Unknown caller." If this is a video visit, we will walk you through how to get the video launched on your phone. We will remind you check your blood pressure, heart rate and weight prior to your scheduled appointment. If you have an Apple Watch or Kardia, please upload any pertinent ECG strips the day before or morning of your appointment to MyChart. Our staff will also make sure you have reviewed the consent and agree to move forward with your scheduled tele-health visit.     THE DAY OF YOUR APPOINTMENT  Approximately 15 minutes prior to your scheduled appointment,  you will receive a telephone call from one of [facility] team - your caller ID may say "Unknown caller."  Our staff will confirm medications, vital signs for the day and any symptoms you may be experiencing. Please have this information available prior to the time of visit start. It may also be helpful for you to have a pad of paper and pen handy for any instructions given during your visit. They will also walk you through joining the smartphone meeting if this is a video visit.    CONSENT FOR TELE-HEALTH VISIT - PLEASE REVIEW  I hereby voluntarily request, consent  and authorize [facility] and its employed or contracted physicians, physician assistants, nurse practitioners or other licensed health care professionals (the Practitioner), to provide me with telemedicine health care services (the Services") as deemed necessary by the treating Practitioner. I acknowledge and consent to receive the Services by the Practitioner via telemedicine. I understand that the telemedicine visit will involve communicating with the Practitioner through live audiovisual communication technology and the disclosure of certain medical information by electronic transmission. I acknowledge that I have been given the opportunity to request an in-person assessment or other available alternative prior to the telemedicine visit and am voluntarily participating in the telemedicine visit.  I understand that I have the right to withhold or withdraw my consent to the use of telemedicine in the course of my care at any time, without affecting my right to future care or treatment, and that the Practitioner or I may terminate the telemedicine visit at any time. I understand that I have the right to inspect all information obtained and/or recorded in the course of the telemedicine visit and may receive copies of available information for a reasonable fee.  I understand that some of the potential risks of receiving the Services via telemedicine include:   Delay or interruption in medical evaluation due to technological equipment failure or disruption;  Information transmitted may not be sufficient (e.g. poor resolution of images) to allow for appropriate medical decision making by the Practitioner; and/or   In rare instances, security protocols could fail, causing a breach of personal health information.  Furthermore, I acknowledge that it is my responsibility to provide information about my medical history, conditions and care that is complete and accurate to the best of my ability. I acknowledge  that Practitioner's advice, recommendations, and/or decision may be based on factors not within their control, such as incomplete or inaccurate data provided by me or distortions of diagnostic images or specimens that may result from electronic transmissions. I understand that the practice of medicine is not an exact science and that Practitioner makes no warranties or guarantees regarding treatment outcomes. I acknowledge that I will receive a copy of this consent concurrently upon execution via email to the email address I last provided but may also request a printed copy by calling the office of [facility].    I understand that my insurance will be billed for this visit.   I have read or had this consent read to me.  I understand the contents of this consent, which adequately explains the benefits and risks of the Services being provided via telemedicine.   I have been provided ample opportunity to ask questions regarding this consent and the Services and have had my questions answered to my satisfaction.  I give my informed consent for the services to be provided through the use of telemedicine in my medical care  By participating in this telemedicine visit  I agree to the above.

## 2019-01-22 NOTE — Progress Notes (Signed)
Virtual Visit via Video Note   33 y.o. first seen due to national recommendations for restrictions regarding the COVID-19 Pandemic (e.g. social distancing) in an effort to limit this patient's exposure and mitigate transmission in our community.  Due to her co-morbid illnesses, this patient is at least at moderate risk for complications without adequate follow up.  This format is felt to be most appropriate for this patient at this time.  All issues noted in this document were discussed and addressed.  A limited physical exam was performed with this format.  Please refer to the patient's chart for her consent to telehealth for [facility].   Date:  01/24/2019   ID:  [name] [name], DOB 1986-03-30, MRN [id number]  Patient Location: Home Provider Location: Office  PCP:  Patient, No Pcp Per  Cardiologist:  Charlton Haws, MD   Electrophysiologist:  None   Evaluation Performed:  Follow-Up Visit  Chief Complaint:  Dizziness and Dyspnea  History of Present Illness:    33 y.o. first seen 08/01/18 for dizziness and dyspnea. At that time she was [redacted] weeks pregnant [id number] Symptoms atypical for cardiac disease Seen at [facility] 07/24/18 ECG normal No history of post partum DCM or HTN, DM.  Hct and Thyroid normal at time   Echo reviewed from 08/09/18 normal EF 60-65%     The patient does not have symptoms concerning for COVID-19 infection (fever, chills, cough, or new shortness of breath).    Past Medical History:  Diagnosis Date  . Gonorrhea   . Left cornual ectopic treated with methotrexate therapy 08/02/2016   Past Surgical History:  Procedure Laterality Date  . CESAREAN SECTION  05/09/2012   Procedure: CESAREAN SECTION;  Surgeon: Antionette Char, MD;  Location: [facility];  Service: Gynecology;  Laterality: N/A;  . DILATION AND CURETTAGE OF UTERUS    . WISDOM TOOTH EXTRACTION       Current Meds  Medication Sig  . albuterol (PROVENTIL HFA;VENTOLIN HFA) 108 (90 Base)  MCG/ACT inhaler Inhale 2 puffs into the lungs every 6 (six) hours as needed for wheezing or shortness of breath.  . Drospirenone (SLYND) 4 MG TABS Take 1 tablet by mouth daily.  . ferrous sulfate (FERROUSUL) 325 (65 FE) MG tablet Take 1 tablet (325 mg total) by mouth 2 (two) times daily.  Marland Kitchen ibuprofen (ADVIL,MOTRIN) 600 MG tablet Take 1 tablet (600 mg total) by mouth 3 (three) times daily.  . Prenat-Fe Poly-Methfol-FA-DHA (VITAFOL ULTRA) 29-0.6-0.4-200 MG CAPS Take 1 capsule by mouth daily before breakfast.  . senna (SENOKOT) 8.6 MG TABS tablet Take 1 tablet (8.6 mg total) by mouth daily.     Allergies:   Oseltamivir phosphate   Social History   Tobacco Use  . Smoking status: Never Smoker  . Smokeless tobacco: Never Used  Substance Use Topics  . Alcohol use: No  . Drug use: No     Family Hx: The patient's family history includes Cancer in her father; Heart disease in her mother. There is no history of Anesthesia problems or Other.  ROS:   Please see the history of present illness.     All other systems reviewed and are negative.   Prior CV studies:   The following studies were reviewed today:  Echo 08/09/18  Labs/Other Tests and Data Reviewed:    EKG:  SR rate 88 normal 07/24/18  Recent Labs: 07/24/2018: ALT 17; BUN 10; Creatinine, Ser 0.49; Potassium 3.5; Sodium 134; TSH 1.308 11/06/2018: Hemoglobin 10.5; Platelets 269  Recent Lipid Panel No results found for: CHOL, TRIG, HDL, CHOLHDL, LDLCALC, LDLDIRECT  Wt Readings from Last 3 Encounters:  01/24/19 63.5 kg  11/02/18 73.5 kg  11/06/18 73.9 kg     Objective:    Vital Signs:  Ht 4\' 11"  (1.499 m)   Wt 63.5 kg   BMI 28.28 kg/m    Black female in no distress No tachypnea No JVP elevation No edema Skin warm and dry   ASSESSMENT & PLAN:    1. Dyspnea:  Related to pregnancy Normal ECG and echo   2. Dizziness: During pregnancy    COVID-19 Education: The signs and symptoms of COVID-19 were discussed with the  patient and how to seek care for testing (follow up with PCP or arrange E-visit).  The importance of social distancing was discussed today.  Time:   Today, I have spent 30 minutes with the patient with telehealth technology discussing the above problems.     Medication Adjustments/Labs and Tests Ordered: Current medicines are reviewed at length with the patient today.  Concerns regarding medicines are outlined above.   Tests Ordered: No orders of the defined types were placed in this encounter.   Medication Changes: No orders of the defined types were placed in this encounter.   Disposition:  Follow up prn  Signed, Charlton HawsPeter , MD  01/24/2019 12:11 PM    [facility]

## 2019-01-24 ENCOUNTER — Telehealth: Payer: Medicaid Other | Admitting: Cardiovascular Disease

## 2019-01-24 ENCOUNTER — Encounter: Payer: Self-pay | Admitting: Cardiovascular Disease

## 2019-01-24 ENCOUNTER — Other Ambulatory Visit: Payer: Self-pay

## 2019-02-05 ENCOUNTER — Telehealth: Payer: Self-pay | Admitting: Cardiology

## 2019-02-05 NOTE — Telephone Encounter (Signed)
New message     Virtual video visit rescheduled to 02-13-19.  Pt missed a virtual visit on 01-24-19 with Dr Eden Emms.

## 2019-02-12 NOTE — Progress Notes (Deleted)
erroneous

## 2019-02-13 ENCOUNTER — Telehealth: Payer: Medicaid Other | Admitting: Cardiology

## 2019-02-13 ENCOUNTER — Telehealth: Payer: Self-pay

## 2019-02-13 ENCOUNTER — Other Ambulatory Visit: Payer: Self-pay

## 2019-02-13 NOTE — Telephone Encounter (Signed)
Left message regarding appt on 02/13/19. 

## 2019-03-24 IMAGING — US US MFM OB DETAIL+14 WK
2 series · 14 of 28 positions shown · non-contrast
Comparison: none

[Series 1: us mfm ob detail+14 wk · 11 of 86 slices shown (1 of 2)]
[im 5/86]
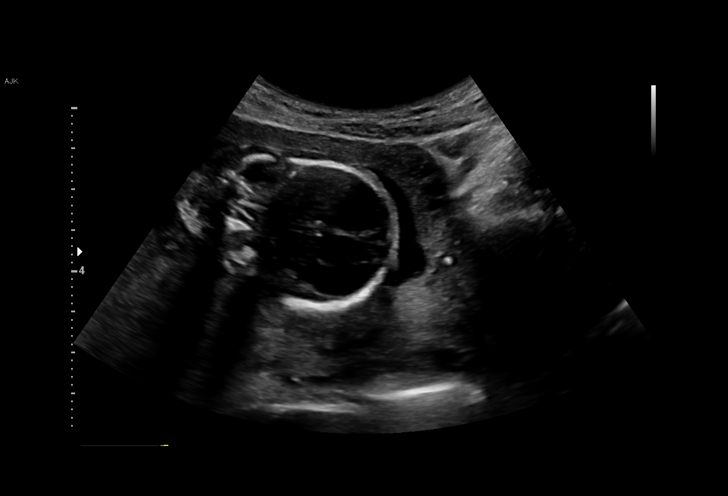
[im 13/86]
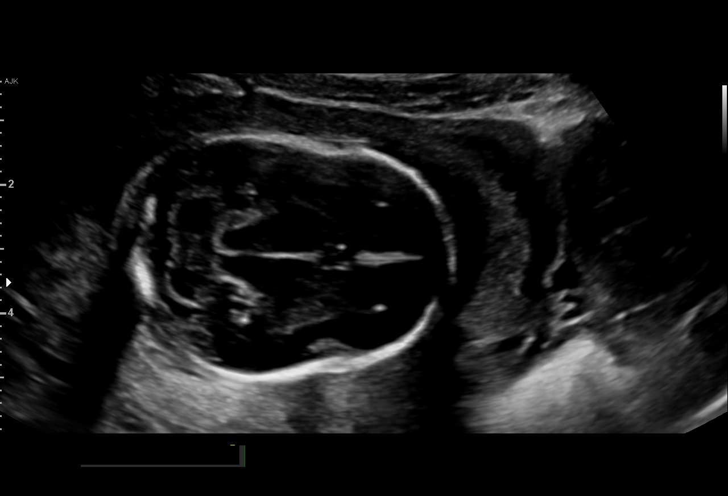
[im 21/86]
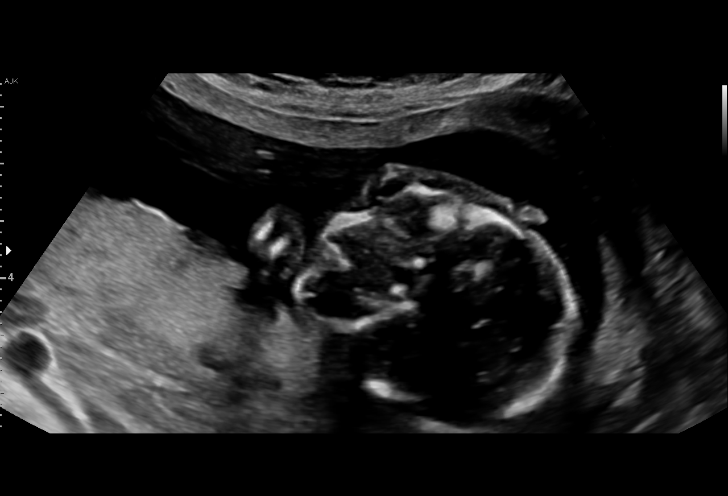
[im 29/86]
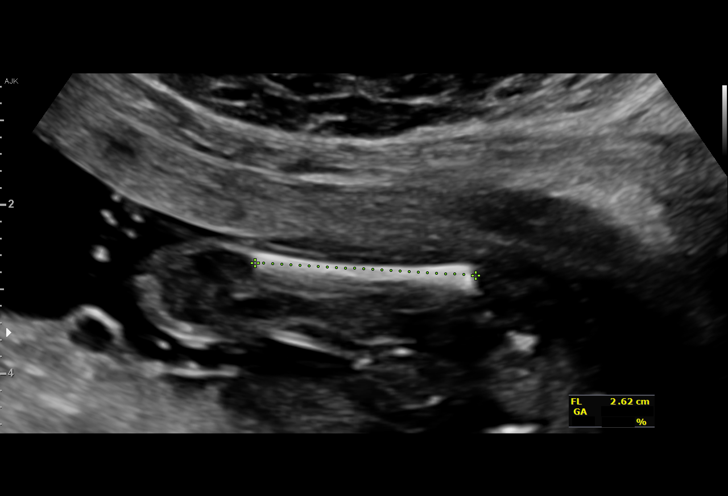
[im 37/86]
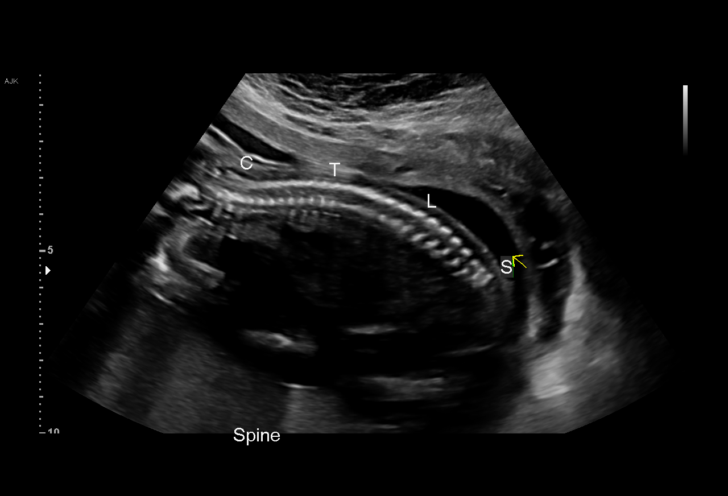
[im 45/86]
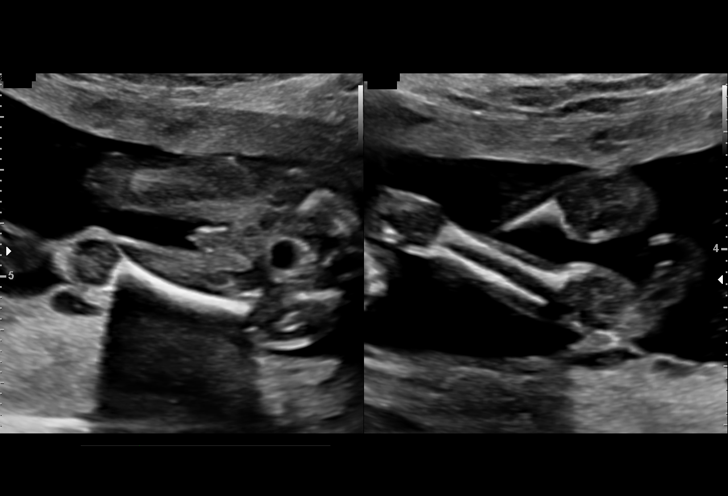
[im 53/86]
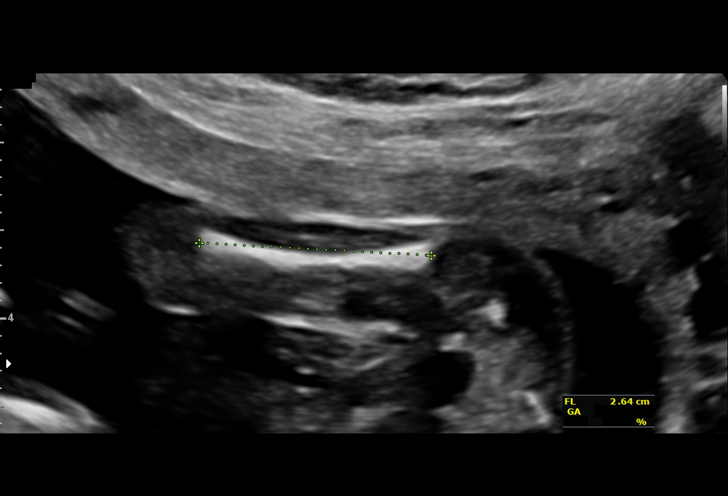
[im 61/86]
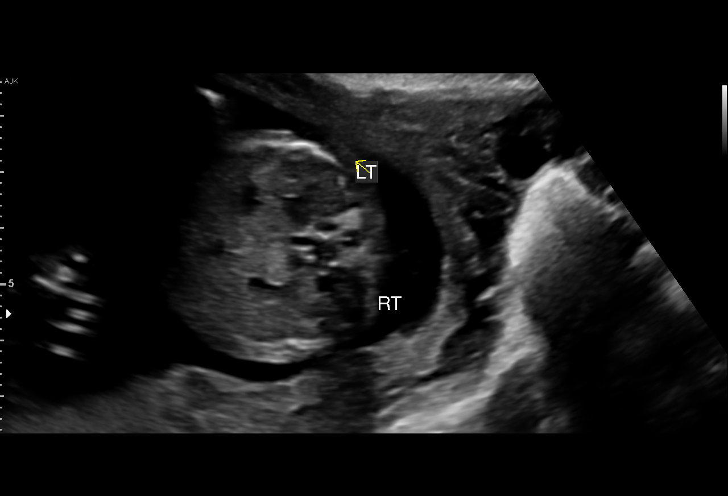
[im 69/86]
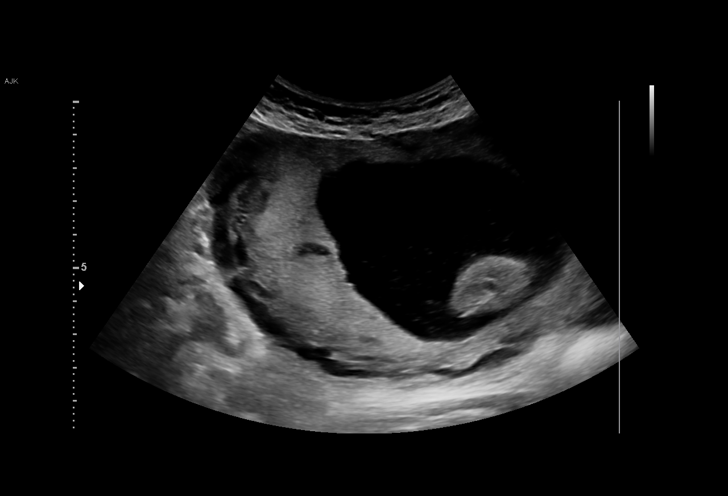
[im 77/86]
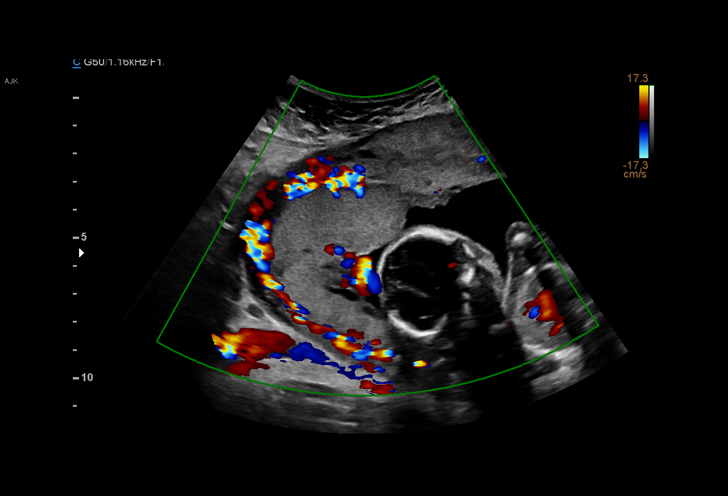
[im 86/86]
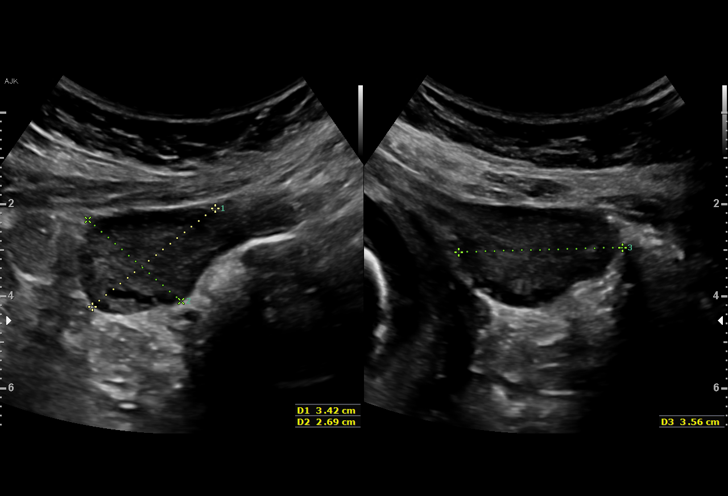

[Series 3: us mfm ob detail+14 wk · 24 acquisitions, 3 frames shown (2 of 2)]
[im 5/24]
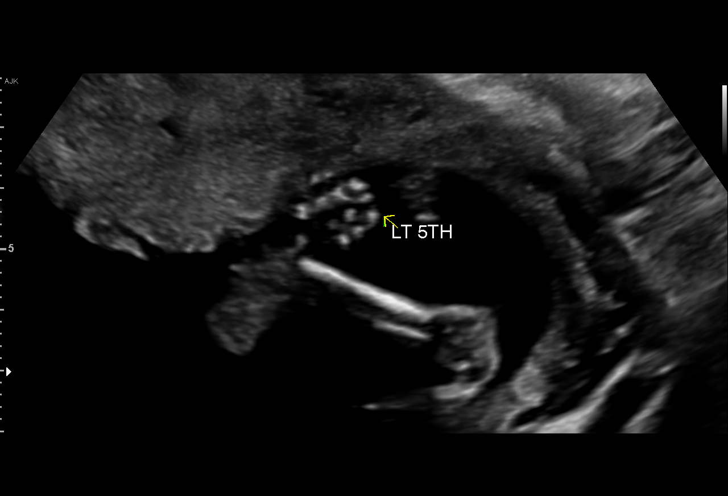
[im 14/24]
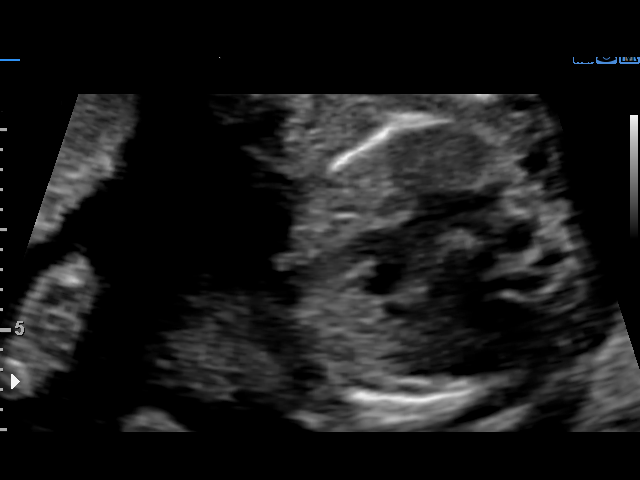
[im 24/24]
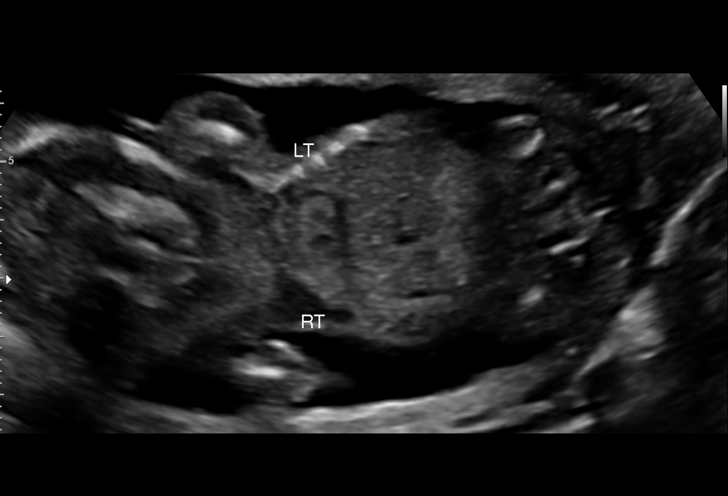

[14 of 28 positions shown; findings below may reference images not displayed]

Indications

Encounter for antenatal screening for
malformations
Previous cesarean delivery, antepartum
18 weeks gestation of pregnancy
Vital Signs

BMI:
Fetal Evaluation

Num Of Fetuses:         1
Fetal Heart Rate(bpm):  159
Cardiac Activity:       Observed
Presentation:           Variable
Placenta:               Posterior
P. Cord Insertion:      Visualized, central

Amniotic Fluid
AFI FV:      Within normal limits

Largest Pocket(cm)
5.14
Biometry

BPD:      38.7  mm     G. Age:  17w 6d         23  %    CI:        69.57   %    70 - 86
FL/HC:      17.8   %    15.8 - 18
HC:      148.1  mm     G. Age:  17w 6d         20  %    HC/AC:      1.08        1.07 -
AC:      137.5  mm     G. Age:  19w 1d         71  %    FL/BPD:     68.2   %
FL:       26.4  mm     G. Age:  18w 0d         30  %    FL/AC:      19.2   %    20 - 24
HUM:      26.1  mm     G. Age:  18w 1d         48  %
CER:      18.7  mm     G. Age:  18w 2d         47  %
NFT:       4.5  mm
LV:        6.8  mm
CM:        3.6  mm

Est. FW:     245  gm      0 lb 9 oz     49  %
OB History

Gravidity:    11        Term:   4        Prem:   0        SAB:   1
TOP:          4       Ectopic:  1        Living: 4
Gestational Age

LMP:           18w 3d        Date:  02/17/18                 EDD:   11/24/18
U/S Today:     18w 2d                                        EDD:   11/25/18
Best:          18w 3d     Det. By:  LMP  (02/17/18)          EDD:   11/24/18
Anatomy

Cranium:               Appears normal         LVOT:                   Appears normal
Cavum:                 Appears normal         Aortic Arch:            Appears normal
Ventricles:            Appears normal         Ductal Arch:            Not well visualized
Choroid Plexus:        Appears normal         Diaphragm:              Appears normal
Cerebellum:            Appears normal         Stomach:                Appears normal, left
sided
Posterior Fossa:       Appears normal         Abdomen:                Appears normal
Nuchal Fold:           Appears normal         Abdominal Wall:         Appears nml (cord
insert, abd wall)
Face:                  Appears normal         Cord Vessels:           Appears normal (3
(orbits and profile)                           vessel cord)
Lips:                  Appears normal         Kidneys:                Appear normal
Palate:                Appears normal         Bladder:                Appears normal
Thoracic:              Appears normal         Spine:                  Appears normal
Heart:                 Appears normal         Upper Extremities:      Appears normal
(4CH, axis, and
situs)
RVOT:                  Appears normal         Lower Extremities:      Appears normal

Other:  Fetus appears to be a male. Heels, feet, and 5th digit visualized.
Nasal bone visualized. Technically difficult due to fetal position.
Cervix Uterus Adnexa

Cervix
Length:           3.56  cm.
Normal appearance by transabdominal scan.

Uterus
No abnormality visualized.

Left Ovary
Within normal limits.

Right Ovary
Within normal limits.

Adnexa
No abnormality visualized.
Impression

We performed fetal anatomy scan. No makers of
aneuploidies or fetal structural defects are seen. Fetal
biometry is consistent with her previously-established dates.
Amniotic fluid is normal and good fetal activity is seen.

On cell-free fetal DNA screening, the risks of fetal
aneuploidies are not increased. MSAFP screening showed
low risk for open-neural tube defects.
Recommendations

Follow-up scans as clinically indicated.

## 2019-06-04 ENCOUNTER — Telehealth: Payer: Self-pay

## 2019-06-04 MED ORDER — ETONOGESTREL-ETHINYL ESTRADIOL 0.12-0.015 MG/24HR VA RING: VAGINAL_RING | VAGINAL | 12 refills | Status: DC

## 2019-06-04 NOTE — Telephone Encounter (Signed)
Pt made aware Rx was sent and directions to try for 3 months.  Pt voiced understanding.

## 2019-06-04 NOTE — Addendum Note (Signed)
Addended by: Mora Bellman on: 06/04/2019 10:43 AM   Modules accepted: Orders

## 2019-06-04 NOTE — Telephone Encounter (Signed)
TC from pt requesting to Change Birth control method to [employer] from OCP's Pt states she stopped taking her pills last month due to pills causing irregular bleeding. Pt states she has not had any unprotected intercourse. Pt would like Rx sent to [facility] on [address].

## 2019-06-26 NOTE — ED Provider Notes (Signed)
[facility]    CSN: [id number] Arrival date & time: 04/05/18  1740      History   Chief Complaint Chief Complaint  Patient presents with  . Nausea    HPI [name] [name] is a 33 y.o. female.   Pt complains of nausea. She has not had vomiting or abdominal pain.  BM normal.      Past Medical History:  Diagnosis Date  . Gonorrhea   . 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 VBAC, delivered 11/06/2018  . Rubella non-immune status, antepartum 05/17/2018  . Supervision of other normal pregnancy, antepartum 05/07/2018  . Previous cesarean delivery affecting pregnancy 05/07/2018    Past Surgical History:  Procedure Laterality Date  . CESAREAN SECTION  05/09/2012   Procedure: CESAREAN SECTION;  Surgeon: Antionette Char, MD;  Location: [facility];  Service: Gynecology;  Laterality: N/A;  . DILATION AND CURETTAGE OF UTERUS    . WISDOM TOOTH EXTRACTION      OB History    Gravida  11   Para  5   Term  5   Preterm  0   AB  6   Living  5     SAB  1   TAB  4   Ectopic  1   Multiple  0   Live Births  5        Obstetric Comments  SAB x 1. Ectopic x 1. TAB x 4.          Home Medications    Prior to Admission medications   Medication Sig Start Date End Date Taking? Authorizing Provider  albuterol (PROVENTIL HFA;VENTOLIN HFA) 108 (90 Base) MCG/ACT inhaler Inhale 2 puffs into the lungs every 6 (six) hours as needed for wheezing or shortness of breath. 07/30/18   Leftwich-Kirby, Wilmer Floor, CNM  Drospirenone (SLYND) 4 MG TABS Take 1 tablet by mouth daily. 12/19/18   Constant, Peggy, MD  etonogestrel-ethinyl estradiol (NUVARING) 0.12-0.015 MG/24HR vaginal ring Insert vaginally and leave in place for 3 consecutive weeks, then remove for 1 week. 06/04/19   Constant, Peggy, MD  ferrous sulfate (FERROUSUL) 325 (65 FE) MG tablet Take 1 tablet (325 mg total) by mouth 2 (two) times  daily. 08/28/18   Constant, Peggy, MD  ibuprofen (ADVIL,MOTRIN) 600 MG tablet Take 1 tablet (600 mg total) by mouth 3 (three) times daily. 11/07/18   Arlyce Harman, DO  Prenat-Fe Poly-Methfol-FA-DHA (VITAFOL ULTRA) 29-0.6-0.4-200 MG CAPS Take 1 capsule by mouth daily before breakfast. 03/27/18   Brock Bad, MD  senna (SENOKOT) 8.6 MG TABS tablet Take 1 tablet (8.6 mg total) by mouth daily. 11/07/18   Arlyce Harman, DO    Family History Family History  Problem Relation Age of Onset  . Heart disease Mother   . Cancer Father   . Anesthesia problems Neg Hx   . Other Neg Hx     Social History Social History   Tobacco Use  . Smoking status: Never Smoker  . Smokeless tobacco: Never Used  Substance Use Topics  . Alcohol use: No  . Drug use: No     Allergies   Oseltamivir phosphate   Review of Systems Review of Systems  Constitutional: Negative for chills and fever.  HENT: Negative for sore throat and tinnitus.   Eyes: Negative for redness.  Respiratory: Negative for cough and shortness of breath.   Cardiovascular: Negative for chest pain  and palpitations.  Gastrointestinal: Positive for nausea. Negative for abdominal pain, diarrhea and vomiting.  Genitourinary: Negative for dysuria, frequency and urgency.  Musculoskeletal: Negative for myalgias.  Skin: Negative for rash.       No lesions  Neurological: Negative for weakness.  Hematological: Does not bruise/bleed easily.  Psychiatric/Behavioral: Negative for suicidal ideas.     Physical Exam Triage Vital Signs ED Triage Vitals  Enc Vitals Group     BP 04/05/18 1808 122/68     Pulse Rate 04/05/18 1808 81     Resp 04/05/18 1808 16     Temp 04/05/18 1808 98.7 F (37.1 C)     Temp Source 04/05/18 1808 Oral     SpO2 04/05/18 1808 100 %     Weight --      Height --      Head Circumference --      Peak Flow --      Pain Score 04/05/18 1820 0     Pain Loc --      Pain Edu? --      Excl. in GC? --    No  data found.  Updated Vital Signs BP 122/68 (BP Location: Right Arm)   Pulse 81   Temp 98.7 F (37.1 C) (Oral)   Resp 16   SpO2 100%   Visual Acuity Right Eye Distance:   Left Eye Distance:   Bilateral Distance:    Right Eye Near:   Left Eye Near:    Bilateral Near:     Physical Exam Vitals signs and nursing note reviewed.  Constitutional:      General: She is not in acute distress.    Appearance: She is well-developed.  HENT:     Head: Normocephalic and atraumatic.  Eyes:     General: No scleral icterus.    Conjunctiva/sclera: Conjunctivae normal.     Pupils: Pupils are equal, round, and reactive to light.  Neck:     Musculoskeletal: Normal range of motion and neck supple.     Thyroid: No thyromegaly.     Vascular: No JVD.     Trachea: No tracheal deviation.  Cardiovascular:     Rate and Rhythm: Normal rate and regular rhythm.     Heart sounds: Normal heart sounds. No murmur. No friction rub. No gallop.   Pulmonary:     Effort: Pulmonary effort is normal.     Breath sounds: Normal breath sounds.  Abdominal:     General: Bowel sounds are normal. There is no distension.     Palpations: Abdomen is soft.     Tenderness: There is no abdominal tenderness.  Musculoskeletal: Normal range of motion.  Lymphadenopathy:     Cervical: No cervical adenopathy.  Skin:    General: Skin is warm and dry.  Neurological:     Mental Status: She is alert and oriented to person, place, and time.     Cranial Nerves: No cranial nerve deficit.  Psychiatric:        Behavior: Behavior normal.        Thought Content: Thought content normal.        Judgment: Judgment normal.      UC Treatments / Results  Labs (all labs ordered are listed, but only abnormal results are displayed) Labs Reviewed  POCT PREGNANCY, URINE - Abnormal; Notable for the following components:      Result Value   Preg Test, Ur POSITIVE (*)    All other components within normal limits  POCT  URINALYSIS DIP  (DEVICE)    EKG   Radiology No results found.  Procedures Procedures (including critical care time)  Medications Ordered in UC Medications - No data to display  Initial Impression / Assessment and Plan / UC Course  I have reviewed the triage vital signs and the nursing notes.  Pertinent labs & imaging results that were available during my care of the patient were reviewed by me and considered in my medical decision making (see chart for details).     Urine preg POSITIVE. Rx Diclegis.  Advised establishing care with OB for early prenatal visits.   Final Clinical Impressions(s) / UC Diagnoses   Final diagnoses:  Nausea  Less than [redacted] weeks gestation of pregnancy   Discharge Instructions   None    ED Prescriptions    Medication Sig Dispense Auth. Provider   Doxylamine-Pyridoxine 10-10 MG TBEC Take 2 tablets by mouth at bedtime. Patient not taking:  Reported on 08/01/2018 60 tablet Harrie Foreman, MD     PDMP not reviewed this encounter.   Harrie Foreman, MD 06/26/19 [phone]

## 2019-10-08 ENCOUNTER — Ambulatory Visit: Payer: Medicaid Other | Admitting: Advanced Practice Midwife

## 2019-10-08 ENCOUNTER — Other Ambulatory Visit: Payer: Self-pay

## 2019-10-08 VITALS — BP 125/77 | HR 87 | Ht 59.0 in | Wt 136.0 lb

## 2019-10-08 DIAGNOSIS — G479 Sleep disorder, unspecified: Secondary | ICD-10-CM | POA: Diagnosis not present

## 2019-10-08 DIAGNOSIS — F419 Anxiety disorder, unspecified: Secondary | ICD-10-CM | POA: Diagnosis not present

## 2019-10-08 DIAGNOSIS — M7989 Other specified soft tissue disorders: Secondary | ICD-10-CM | POA: Diagnosis not present

## 2019-10-08 DIAGNOSIS — Z3045 Encounter for surveillance of transdermal patch hormonal contraceptive device: Secondary | ICD-10-CM | POA: Diagnosis not present

## 2019-10-08 DIAGNOSIS — Z3009 Encounter for other general counseling and advice on contraception: Secondary | ICD-10-CM

## 2019-10-08 MED ORDER — XULANE 150-35 MCG/24HR TD PTWK: 1.0000 | MEDICATED_PATCH | TRANSDERMAL | 12 refills | Status: DC

## 2019-10-08 MED ORDER — CITALOPRAM HYDROBROMIDE 20 MG PO TABS: 20.0000 mg | ORAL_TABLET | Freq: Every day | ORAL | 3 refills | Status: DC

## 2019-10-08 MED ORDER — ZOLPIDEM TARTRATE 10 MG PO TABS: 5.0000 mg | ORAL_TABLET | Freq: Every evening | ORAL | 0 refills | Status: DC | PRN

## 2019-10-08 NOTE — Progress Notes (Addendum)
GYN presents for [facility] consult.  She has bern on the Patch x 2 months and wants to continue; needs RX.  Also wants PCP referral to manage and treat her anxiety.

## 2019-10-08 NOTE — Progress Notes (Signed)
GYNECOLOGY PROGRESS NOTE  History:  34 y.o. [id number] presents to [facility] office today for problem gyn visit. She reports swelling of her left upper foot since her baby was born 68 months ago and anxiety for several years worsened since her baby was born.  She also reports anxiety increasing since 19-Jan-2016 when her mother passed away. She has some sadness and depression but mostly racing thoughts she cannot shut off to get to sleep. She is not sleeping much at all. She takes Nyquil or benadryl or other OTC sleep aids which help sometimes. She denies any thoughts of harming herself or others.  She is using the contraceptive patch for birth control and is happy with the method.  Periods are light and regular.  She denies h/a, dizziness, shortness of breath, n/v, or fever/chills.    The following portions of the patient's history were reviewed and updated as appropriate: allergies, current medications, past family history, past medical history, past social history, past surgical history and problem list. Last pap smear on 05/07/2018 was normal, negative HRHPV.    Office Visit from 10/08/2019 in [facility]  PHQ-9 Total Score  8      .phq9 Review of Systems:  Pertinent items are noted in HPI.   Objective:  Physical Exam Blood pressure 125/77, pulse 87, height 4\' 11"  (1.499 m), weight 136 lb (61.7 kg), last menstrual period 10/02/2019, unknown if currently breastfeeding. VS reviewed, nursing note reviewed,  Constitutional: well developed, well nourished, no distress HEENT: normocephalic HEART: normal rate, heart sounds, regular rhythm RESP: normal effort, lung sounds clear and equal bilaterally Breast Exam: deferred Abdomen: soft Neuro: alert and oriented x 3 Skin: warm, dry MSK: Normal range of motion, soft area of fatty tissue palpable on top of left foot, over metatarsals.  Nonpitting, no fluctuation, no evidence of edema. Psych: affect normal Pelvic exam:  Deferred  Assessment & Plan:  1. Anxiety --Pt with hx of anxiety, never treated with medications.  Worsened when her mother died in 01-19-16 and again worsened after the birth of her 21 month old.  She reports racing thoughts and problems sleeping. She denies any thoughts of self harm. --Will start antidepressant today. Pt to f/u with PCP and BH, list of both given. --F/U with me in 2 months, consider increasing Celexa PRN - citalopram (CELEXA) 20 MG tablet; Take 1 tablet (20 mg total) by mouth daily.  Dispense: 30 tablet; Refill: 3  2. Swelling of left foot --On exam, changes to left foot noted with soft tissue on top of foot, over metatarsals but no evidence of edema. --No pain on exam and no pain reported by pt with normal activities but pain was present for 4-6 months postpartum in her left foot.  ROM wnl today. --May be changes related to pregnancy, collapse of arch, etc.  F/U with PCP. May need to see podiatry or sports medicine.  3. Encounter for general counseling and advice on contraceptive management --Discussed LARCs as most effective forms of birth control.  Discussed benefits/risks of other methods.  Pt desires to continue contraceptive patch.  Discussed increased risks of DVT/PE with estrogen containing contraceptives and with the patch in particular.  Pt without risk factors except she is age 36.  Will continue patch for 1 year, reevaluate next year.  Pt will consider LARC in the future.  --Rx for [facility] sent to pharmacy.  - norelgestromin-ethinyl estradiol Marilu Favre) 150-35 MCG/24HR transdermal patch; Place 1 patch onto the skin once a  week.  Dispense: 3 patch; Refill: 12  4. Sleep disturbance --Pt has tried multiple OTC treatments.   --Discussed good sleep hygiene. --Will aid with sleep temporarily until Celexa is beginning to work.   - zolpidem (AMBIEN) 10 MG tablet; Take 0.5-1 tablets (5-10 mg total) by mouth at bedtime as needed for sleep.  Dispense: 30 tablet; Refill: 0  [name], CNM 3:39 PM

## 2019-10-08 NOTE — Patient Instructions (Signed)
[facility]  [address] [place], [place] [phone]  [facility] [address], [place], [place] [phone] or [phone] [email or web address]  [facility], 8:30-5:00 [address] [place], [place] [phone] [email or web address]  *Bring your own interpreter at 1st visit  [facility] [address], [place], [place] [phone] [email or web address]   Psychotherapeutic Services/ACT Services  [address], [place], [place] [phone]  [facility] Walk-in Mon-Fri, 8am-3pm [address], [place], [place] [phone] [email or web address]  [facility] [address] [place], [place], [place] [phone]  [facility] [address], [place], [place] [phone]  [facility] [address], [place], [place] [phone] *pacientes que hablen espanol, favor comunicarse con el Sr. [place], extension [phone] o la [address], extension [place] para hacer una cita.   [facility] [address] "[place]" [phone] (Habla Espanol)  [facility] [address] [place], [place], [place] [phone]  [facility] [address], [place], [place] [phone]   [facility]: [facility](Healing and Empowering All Survivors)  [address], [place], [place] [phone] [email or web address]  *Uninsured and underinsured, ages 19-64  [facility] [address] [place], [place], [place] [phone] (Habla Espanol)  [facility] [address], [place], [place] [phone] (Habla Espanol)  [facility] [address] [place], [place] [phone] Clarice Pole)  [facility] Mon-Thurs 8:30am-8:00pm/ Fri 8:30-7:00pm [address], [place], [place] (3rd floor, located at corner of [employer] and [employer]) Call [phone] to schedule an appointment [email or web address]  [facility] [address], [place], [place]  [phone]  [facility]  [address] [place], [place] [phone]  Social Support MHAG ([facility]ental [facility] [place]) [phone] or www.[email or web address] [address], [place], [place] [address] * Recovery support and educational programs, including recovery skills classes, support groups, and one-on-one sessions with [place] Certified Peer Support Specialists.    NAMI [facility] of the Mentally Ill) [place] NAMI Helpline: 2084529940 * Family and Friends Support Group/  Contact Philomena Doheny at [phone] for more information * Family to [employer] and Basics Class : enroll online or email Darreld Mclean at [email or web address]  * Monthly educational meetings, contact Dwain Sarna at [phone] [email or web address]    24- Hour Availability:  [facility] [phone] or [phone]  * Family Service of the [employer] (Domestic Violence, Rape, Victim Assistance) Line ([phone]  Vesta Mixer [phone] or [phone]  * [facility] [place] [facility]  [phone] [phone] hours)  *[facility] Mobile Crisis Unit [phone]  *[place] National Suicide Hotline (310)574-3415    No Primary Care Doctor:  To locate a primary care doctor that accepts your insurance or provides certain services:      *       [place] Connect: [phone] *       Physician Referral Service: [phone] ask for "My Honor" *       Check the website for your insurance, usually on your insurance card  . If no insurance, you need to see if you  qualify for [facility] "orange card", call to set      up appointment for eligibility/enrollment at [phone] or [phone] or visit [facility] of Health and [employer] ([address], [place] and [address] [place]) to meet with a [facility] enrollment specialist.  Agencies that provide inexpensive (sliding fee scale) medical care:   Marland Kitchen    [facility] - Family Medicine at [facility] .    [facility] [place] [phone] .    [facility] Internal [facility] - [phone] .    [place] [phone] .    [facility] for [place] [phone] .    [place] [phone] . [facility] - [place] @ [place] [phone]-     [phone] . [facility] - [place] @ [place] [phone] . [facility]: [phone]  . Women's Clinic: [phone]  . Planned Parenthood: [phone]  . Family Services of the [place] Providers:           [facility] - [phone] (No Family Planning accepted)          [address], [phone], Mon-Fri 9am-5pm          [facility] [phone] . [place], [place] 8am-5pm, Fri 8am-noon . [place], Mon-Fri 7:30am-4:30pm          [facility] - [phone]          [address], [place] [phone] [address]          Only accepts [place] [employer] patients after they have their name applied to their card  Self Pay (no insurance) in [facility]:           Sickle Cell Patients:  . [place], [phone] [facility]  [facility]: . [address], [place] [phone]       [facility] . [place] [phone]  [facility]: . [address] [phone]          [facility]           [place], [phone] [facility] . [place], [phone]           [facility] [place]           [place], [place] [phone]        [facility] - [address]           [phone], Mon-Fri 9am-7pm, [place] 9am-1pm          [facility] - Family Medicine @ [place], [place]          [facility] - [facility]           [address] [place] [facility] - [place] . [address], [place] [phone]          [facility]           [address], [phone]  [facility] [place] [facility]  . [address] [place], [phone] [facility] [place] [facility] . [address] [phone]  Dr. Julio Sicks           [address], [place], [phone]          [facility]           [address], [phone]          [facility]             [address], [phone]          [facility]           [address] [place], [phone], 1st & 3rd Saturday every month, 10am-1pm  OTHERS:  [employer]  (Immigration [employer] Only)  [phone] (Thursday only)  Strategies for finding a Primary Care Provider:  1) Find a Doctor and Pay Out of Pocket  Although you won't have to find out who is covered by your insurance plan, it is a good idea to ask around and get recommendations. You will then need to call the office and see if the doctor you  have chosen will accept you as a new patient and what types of options they offer for patients who are self-pay. Some doctors offer discounts or will set up payment plans for their patients who do not have insurance, but you will need to ask so you aren't surprised when you get to your appointment.  2) Contact [place] [employer] - To see if you qualify for "orange card" access to healthcare safety net providers.  Call for appointment for eligibility/enrollment at [phone] or [phone]. (Uninsured, 0-200% FPL, qualifying info)  Applicants for [facility] are first required to see if they are eligible to enroll in the [facility] Marketplace before enrolling in [facility] (and get an exemption if they are not).  GCCN Criteria for acceptance is:  ? Proof of [employer] exemption - form or documentation  ? Valid photo ID (driver's license, state identification card, passport, home country ID)  ? Proof of [facility] residency (e.g. driver's license, lease/landlord information, pay stubs with address, utility bill, bank statement, etc.)  ? Proof of income (1040, last year's tax return, W2, 4 current pay stubs, other income proof)  ? Proof of assets (current bank statement + 3 most recent, disability paperwork, life insurance info, tax value on autos, etc.)  3) Contact Your Local Health Department  Not all health departments have doctors that can see patients for sick visits, but many do, so it is worth a call to see if yours does. If you don't know where your local health department is, you can check in your phone book. The CDC also has a tool to help you locate your state's health department, and many state websites also have listings of all of their local health departments.  4) Find a Walk-in Clinic  If your illness is not likely to be very severe or complicated, you may want to try a walk in clinic. These are popping up all over the country in pharmacies, drugstores, and shopping centers. They're  usually staffed by nurse practitioners or physician assistants that have been trained to treat common illnesses and complaints. They're usually fairly quick and inexpensive. However,  if you have serious medical issues or chronic medical problems, these are probably not your best option

## 2019-10-09 ENCOUNTER — Telehealth: Payer: Self-pay | Admitting: *Deleted

## 2019-10-09 NOTE — Telephone Encounter (Signed)
Pt called to office stating her Ambien Rx was not covered by ins.   Spoke with pt and made her aware that [facility] will only pay for 15tabs for a 30 day period. Pt made aware after 30 days she may fill the remaining 15 tabs.  Pt states understanding.   Spoke with pharmacy and made them aware to dispense 15 and hold remaining 15 on file for pt to fill at later date.

## 2019-12-09 ENCOUNTER — Telehealth: Payer: Medicaid Other | Admitting: Advanced Practice Midwife

## 2020-06-21 ENCOUNTER — Other Ambulatory Visit: Payer: Self-pay

## 2020-06-21 ENCOUNTER — Encounter (HOSPITAL_COMMUNITY): Payer: Self-pay | Admitting: Emergency Medicine

## 2020-06-21 ENCOUNTER — Ambulatory Visit (HOSPITAL_COMMUNITY)
Admission: EM | Admit: 2020-06-21 | Discharge: 2020-06-21 | Disposition: A | Discharge status: Home / Self Care | Payer: Medicaid Other | Attending: Family Medicine | Admitting: Family Medicine

## 2020-06-21 DIAGNOSIS — R300 Dysuria: Secondary | ICD-10-CM | POA: Diagnosis not present

## 2020-06-21 DIAGNOSIS — N898 Other specified noninflammatory disorders of vagina: Secondary | ICD-10-CM | POA: Insufficient documentation

## 2020-06-21 DIAGNOSIS — Z3202 Encounter for pregnancy test, result negative: Secondary | ICD-10-CM

## 2020-06-21 LAB — POC URINE PREG, ED: Preg Test, Ur: NEGATIVE

## 2020-06-21 LAB — POCT URINALYSIS DIPSTICK, ED / UC
Bilirubin Urine: NEGATIVE
Glucose, UA: NEGATIVE mg/dL
Ketones, ur: NEGATIVE mg/dL
Leukocytes,Ua: NEGATIVE
Nitrite: NEGATIVE
Protein, ur: NEGATIVE mg/dL
Specific Gravity, Urine: >=1.03 (ref 1.005–1.030)
Urobilinogen, UA: 0.2 mg/dL (ref 0.0–1.0)
pH: 5 (ref 5.0–8.0)

## 2020-06-21 MED ORDER — METRONIDAZOLE 500 MG PO TABS: 500.0000 mg | ORAL_TABLET | Freq: Two times a day (BID) | ORAL | 0 refills | Status: DC

## 2020-06-21 NOTE — Discharge Instructions (Addendum)
Treating you for BV.  We will send a swab for testing and call you with any positive results.  Urine sent for culture. Follow up as needed for continued or worsening symptoms

## 2020-06-21 NOTE — ED Triage Notes (Signed)
Pt c/o of malodorous and painful (pressure) urination after sex and continuing everytime pt uses the bathroom. Pt denies protection with intercourse. Pt also c/o numbness is toes after wearing heels yesterday that has yet to resolve.

## 2020-06-22 LAB — CERVICOVAGINAL ANCILLARY ONLY
Bacterial Vaginitis (gardnerella): NEGATIVE
Candida Glabrata: NEGATIVE
Candida Vaginitis: POSITIVE — AB
Chlamydia: NEGATIVE
Comment: NEGATIVE
Comment: NEGATIVE
Comment: NEGATIVE
Comment: NEGATIVE
Comment: NEGATIVE
Comment: NORMAL
Neisseria Gonorrhea: NEGATIVE
Trichomonas: NEGATIVE

## 2020-06-22 LAB — URINE CULTURE: Culture: NO GROWTH

## 2020-06-22 NOTE — ED Provider Notes (Signed)
[facility]    CSN: [id number] Arrival date & time: 06/21/20  1555      History   Chief Complaint Chief Complaint  Patient presents with  . Dysuria    HPI [name] [name] is a 34 y.o. female.   Patient is a 34 year old female presents today with malodorous vaginal discharge, pressure with urination and some dysuria.  Currently sexually active with 1 partner unprotected.  Some mild intermittent lower abdominal cramping.  No vaginal bleeding.     Past Medical History:  Diagnosis Date  . Gonorrhea   . 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 Anxiety 10/08/2019  . Swelling of left foot 10/08/2019  . Sleep disturbance 10/08/2019  . Contraceptive patch status 10/08/2019  . VBAC, delivered 11/06/2018    Past Surgical History:  Procedure Laterality Date  . CESAREAN SECTION  05/09/2012   Procedure: CESAREAN SECTION;  Surgeon: Antionette Char, MD;  Location: [facility];  Service: Gynecology;  Laterality: N/A;  . DILATION AND CURETTAGE OF UTERUS    . WISDOM TOOTH EXTRACTION      OB History    Gravida  11   Para  5   Term  5   Preterm  0   AB  6   Living  5     SAB  1   TAB  4   Ectopic  1   Multiple  0   Live Births  5        Obstetric Comments  SAB x 1. Ectopic x 1. TAB x 4.          Home Medications    Prior to Admission medications   Medication Sig Start Date End Date Taking? Authorizing Provider  metroNIDAZOLE (FLAGYL) 500 MG tablet Take 1 tablet (500 mg total) by mouth 2 (two) times daily. 06/21/20   Candies Palm, Gloris Manchester A, NP  albuterol (PROVENTIL HFA;VENTOLIN HFA) 108 (90 Base) MCG/ACT inhaler Inhale 2 puffs into the lungs every 6 (six) hours as needed for wheezing or shortness of breath. Patient not taking: Reported on 10/08/2019 07/30/18 06/21/20  Leftwich-Kirby, Wilmer Floor, CNM  citalopram (CELEXA) 20 MG tablet Take 1 tablet (20 mg total) by mouth daily.  10/08/19 06/21/20  Leftwich-Kirby, Wilmer Floor, CNM  ferrous sulfate (FERROUSUL) 325 (65 FE) MG tablet Take 1 tablet (325 mg total) by mouth 2 (two) times daily. Patient not taking: Reported on 10/08/2019 08/28/18 06/21/20  Constant, Peggy, MD  norelgestromin-ethinyl estradiol [employer]) 150-35 MCG/24HR transdermal patch Place 1 patch onto the skin once a week. 10/08/19 06/21/20  Leftwich-Kirby, Wilmer Floor, CNM  zolpidem (AMBIEN) 10 MG tablet Take 0.5-1 tablets (5-10 mg total) by mouth at bedtime as needed for sleep. 10/08/19 06/21/20  Leftwich-Kirby, Wilmer Floor, CNM    Family History Family History  Problem Relation Age of Onset  . Heart disease Mother   . Cancer Father   . Anesthesia problems Neg Hx   . Other Neg Hx     Social History Social History   Tobacco Use  . Smoking status: Never Smoker  . Smokeless tobacco: Never Used  Vaping Use  . Vaping Use: Never used  Substance Use Topics  . Alcohol use: No  . Drug use: No     Allergies   Oseltamivir phosphate   Review of Systems Review of Systems   Physical Exam Triage Vital Signs ED Triage Vitals [06/21/20 1755]  Enc Vitals Group  BP (!) 135/95     Pulse Rate (!) 101     Resp 16     Temp 98.5 F (36.9 C)     Temp Source Oral     SpO2 100 %     Weight      Height      Head Circumference      Peak Flow      Pain Score 0     Pain Loc      Pain Edu?      Excl. in GC?    No data found.  Updated Vital Signs BP (!) 135/95 (BP Location: Right Arm)   Pulse (!) 101   Temp 98.5 F (36.9 C) (Oral)   Resp 16   LMP 06/16/2020 (Exact Date)   SpO2 100%   Visual Acuity Right Eye Distance:   Left Eye Distance:   Bilateral Distance:    Right Eye Near:   Left Eye Near:    Bilateral Near:     Physical Exam Vitals and nursing note reviewed.  Constitutional:      General: She is not in acute distress.    Appearance: Normal appearance. She is not ill-appearing, toxic-appearing or diaphoretic.  HENT:     Head: Normocephalic.       Nose: Nose normal.  Eyes:     Conjunctiva/sclera: Conjunctivae normal.  Pulmonary:     Effort: Pulmonary effort is normal.  Musculoskeletal:        General: Normal range of motion.     Cervical back: Normal range of motion.  Skin:    General: Skin is warm and dry.     Findings: No rash.  Neurological:     Mental Status: She is alert.  Psychiatric:        Mood and Affect: Mood normal.      UC Treatments / Results  Labs (all labs ordered are listed, but only abnormal results are displayed) Labs Reviewed  POCT URINALYSIS DIPSTICK, ED / UC - Abnormal; Notable for the following components:      Result Value   Hgb urine dipstick SMALL (*)    All other components within normal limits  URINE CULTURE  POC URINE PREG, ED  CERVICOVAGINAL ANCILLARY ONLY    EKG   Radiology No results found.  Procedures Procedures (including critical care time)  Medications Ordered in UC Medications - No data to display  Initial Impression / Assessment and Plan / UC Course  I have reviewed the triage vital signs and the nursing notes.  Pertinent labs & imaging results that were available during my care of the patient were reviewed by me and considered in my medical decision making (see chart for details).     Vaginal odor Patient with a history of BV. We will go ahead and cover for BV at this time.  Urine with small leuks.  Sending for culture. Swab sent for STD screening Follow up as needed for continued or worsening symptoms  Final Clinical Impressions(s) / UC Diagnoses   Final diagnoses:  Vaginal odor     Discharge Instructions     Treating you for BV.  We will send a swab for testing and call you with any positive results.  Urine sent for culture. Follow up as needed for continued or worsening symptoms     ED Prescriptions    Medication Sig Dispense Auth. Provider   metroNIDAZOLE (FLAGYL) 500 MG tablet Take 1 tablet (500 mg total) by mouth 2 (two) times  daily. 14  tablet Pattricia Weiher A, NP     PDMP not reviewed this encounter.   Janace Aris, NP 06/22/20 [phone]

## 2020-06-23 ENCOUNTER — Telehealth (HOSPITAL_COMMUNITY): Payer: Self-pay | Admitting: Emergency Medicine

## 2020-06-23 MED ORDER — FLUCONAZOLE 150 MG PO TABS: 150.0000 mg | ORAL_TABLET | Freq: Once | ORAL | 0 refills | Status: AC

## 2020-08-25 ENCOUNTER — Other Ambulatory Visit: Payer: Self-pay

## 2020-08-25 ENCOUNTER — Encounter (HOSPITAL_COMMUNITY): Payer: Self-pay

## 2020-08-25 ENCOUNTER — Ambulatory Visit (HOSPITAL_COMMUNITY)
Admission: EM | Admit: 2020-08-25 | Discharge: 2020-08-25 | Disposition: A | Discharge status: Home / Self Care | Payer: Medicaid Other | Attending: Emergency Medicine | Admitting: Emergency Medicine

## 2020-08-25 DIAGNOSIS — Z20822 Contact with and (suspected) exposure to covid-19: Secondary | ICD-10-CM | POA: Diagnosis not present

## 2020-08-25 DIAGNOSIS — J029 Acute pharyngitis, unspecified: Secondary | ICD-10-CM | POA: Insufficient documentation

## 2020-08-25 LAB — RESP PANEL BY RT-PCR (FLU A&B, COVID) ARPGX2
Influenza A by PCR: NEGATIVE
Influenza B by PCR: NEGATIVE
SARS Coronavirus 2 by RT PCR: NEGATIVE

## 2020-08-25 LAB — POCT RAPID STREP A, ED / UC: Streptococcus, Group A Screen (Direct): NEGATIVE

## 2020-08-25 MED ORDER — AMOXICILLIN 500 MG PO CAPS: 500.0000 mg | ORAL_CAPSULE | Freq: Two times a day (BID) | ORAL | 0 refills | Status: AC

## 2020-08-25 NOTE — ED Provider Notes (Signed)
[facility]    CSN: [id number] Arrival date & time: 08/25/20  8676      History   Chief Complaint Chief Complaint  Patient presents with  . Cough  . Sore Throat    HPI [name] [name] is a 34 y.o. female.   Theodoro Doing presents with complaints of sore throat which started two days ago. Initially with headache at time of onset but no headaches since. No fevers. No gi symptoms. Has some cough at night but otherwise without congestion. Ibuprofen has provided temporary relief. No known ill contacts. No history of covid-19 and has not received vaccination. She works in a [employer] trader.    ROS per HPI, negative if not otherwise mentioned.      Past Medical History:  Diagnosis Date  . Gonorrhea   . 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 Anxiety 10/08/2019  . Swelling of left foot 10/08/2019  . Sleep disturbance 10/08/2019  . Contraceptive patch status 10/08/2019  . VBAC, delivered 11/06/2018    Past Surgical History:  Procedure Laterality Date  . CESAREAN SECTION  05/09/2012   Procedure: CESAREAN SECTION;  Surgeon: Antionette Char, MD;  Location: [facility];  Service: Gynecology;  Laterality: N/A;  . DILATION AND CURETTAGE OF UTERUS    . WISDOM TOOTH EXTRACTION      OB History    Gravida  11   Para  5   Term  5   Preterm  0   AB  6   Living  5     SAB  1   TAB  4   Ectopic  1   Multiple  0   Live Births  5        Obstetric Comments  SAB x 1. Ectopic x 1. TAB x 4.          Home Medications    Prior to Admission medications   Medication Sig Start Date End Date Taking? Authorizing Provider  amoxicillin (AMOXIL) 500 MG capsule Take 1 capsule (500 mg total) by mouth 2 (two) times daily for 10 days. 08/25/20 09/04/20  Georgetta Haber, NP  metroNIDAZOLE (FLAGYL) 500 MG tablet Take 1 tablet (500 mg total) by mouth 2 (two) times daily. 06/21/20    Bast, Gloris Manchester A, NP  albuterol (PROVENTIL HFA;VENTOLIN HFA) 108 (90 Base) MCG/ACT inhaler Inhale 2 puffs into the lungs every 6 (six) hours as needed for wheezing or shortness of breath. Patient not taking: Reported on 10/08/2019 07/30/18 06/21/20  Leftwich-Kirby, Wilmer Floor, CNM  citalopram (CELEXA) 20 MG tablet Take 1 tablet (20 mg total) by mouth daily. 10/08/19 06/21/20  Leftwich-Kirby, Wilmer Floor, CNM  ferrous sulfate (FERROUSUL) 325 (65 FE) MG tablet Take 1 tablet (325 mg total) by mouth 2 (two) times daily. Patient not taking: Reported on 10/08/2019 08/28/18 06/21/20  Constant, Peggy, MD  norelgestromin-ethinyl estradiol Burr [employer]ico) 150-35 MCG/24HR transdermal patch Place 1 patch onto the skin once a week. 10/08/19 06/21/20  Leftwich-Kirby, Wilmer Floor, CNM  zolpidem (AMBIEN) 10 MG tablet Take 0.5-1 tablets (5-10 mg total) by mouth at bedtime as needed for sleep. 10/08/19 06/21/20  Leftwich-Kirby, Wilmer Floor, CNM    Family History Family History  Problem Relation Age of Onset  . Heart disease Mother   . Cancer Father   . Anesthesia problems Neg Hx   . Other Neg Hx     Social History Social History  Tobacco Use  . Smoking status: Never Smoker  . Smokeless tobacco: Never Used  Vaping Use  . Vaping Use: Never used  Substance Use Topics  . Alcohol use: No  . Drug use: No     Allergies   Oseltamivir phosphate   Review of Systems Review of Systems   Physical Exam Triage Vital Signs ED Triage Vitals  Enc Vitals Group     BP 08/25/20 0916 126/71     Pulse Rate 08/25/20 0916 87     Resp 08/25/20 0916 18     Temp 08/25/20 0916 97.8 F (36.6 C)     Temp Source 08/25/20 0916 Oral     SpO2 08/25/20 0916 100 %     Weight --      Height --      Head Circumference --      Peak Flow --      Pain Score 08/25/20 0914 6     Pain Loc --      Pain Edu? --      Excl. in GC? --    No data found.  Updated Vital Signs BP 126/71 (BP Location: Right Arm)   Pulse 87   Temp 97.8 F (36.6 C) (Oral)    Resp 18   LMP 08/05/2020   SpO2 100%   Visual Acuity Right Eye Distance:   Left Eye Distance:   Bilateral Distance:    Right Eye Near:   Left Eye Near:    Bilateral Near:     Physical Exam Constitutional:      General: She is not in acute distress.    Appearance: She is well-developed.  HENT:     Head: Normocephalic and atraumatic.     Right Ear: Tympanic membrane, ear canal and external ear normal.     Left Ear: Tympanic membrane, ear canal and external ear normal.     Nose: Nose normal.     Mouth/Throat:     Pharynx: Uvula midline.     Tonsils: Tonsillar exudate present. 1+ on the right. 1+ on the left.  Eyes:     Conjunctiva/sclera: Conjunctivae normal.     Pupils: Pupils are equal, round, and reactive to light.  Cardiovascular:     Rate and Rhythm: Normal rate.  Skin:    General: Skin is warm and dry.  Neurological:     Mental Status: She is alert and oriented to person, place, and time.      UC Treatments / Results  Labs (all labs ordered are listed, but only abnormal results are displayed) Labs Reviewed  CULTURE, GROUP A STREP (THRC)  RESP PANEL BY RT-PCR (FLU A&B, COVID) ARPGX2  POCT RAPID STREP A, ED / UC    EKG   Radiology No results found.  Procedures Procedures (including critical care time)  Medications Ordered in UC Medications - No data to display  Initial Impression / Assessment and Plan / UC Course  I have reviewed the triage vital signs and the nursing notes.  Pertinent labs & imaging results that were available during my care of the patient were reviewed by me and considered in my medical decision making (see chart for details).     Negative rapid strep. Afebrile. No tonsillar swelling. Strep culture pending. Covid testing pending and isolation instructions provided.  Supportive cares recommended for likely viral URI. Have and hold amoxicillin provided as patient with history of frequent tonsillitis.  Return precautions provided.  Patient verbalized understanding and agreeable to plan.  Final Clinical Impressions(s) / UC Diagnoses   Final diagnoses:  Pharyngitis, unspecified etiology     Discharge Instructions     Negative rapid strep here today. I have sent this to be cultured to confirm. It will result in 2-3 days. We would call you if it is positive.  If negative and you are feeling better at all, there is no need to take antibiotics provided. If your throat worsens, develop headache or fevers, you may start antibiotics the end of this week.  If your covid test comes back positive you do not need to take antibiotic.  Push fluids to ensure adequate hydration and keep secretions thin.  Tylenol and/or ibuprofen as needed for pain or fevers.  Throat lozenges, gargles, chloraseptic spray, warm teas, popsicles etc to help with throat pain.   If symptoms worsen or do not improve in the next week to return to be seen or to follow up with your PCP.      ED Prescriptions    Medication Sig Dispense Auth. Provider   amoxicillin (AMOXIL) 500 MG capsule Take 1 capsule (500 mg total) by mouth 2 (two) times daily for 10 days. 20 capsule Georgetta Haber, NP     PDMP not reviewed this encounter.   Georgetta Haber, NP 08/25/20 [phone]

## 2020-08-25 NOTE — ED Triage Notes (Addendum)
Pt c/o dysphagia, right ear pressure, non-productive cough since Sunday morning.  Denies fever, n/v/d, congestion, runny nose, body aches, fatigue, change in taste/smell. Took 600mg  ibuprofen at 0600  No COVID vaccines, or confirmed COVID infection.

## 2020-08-25 NOTE — Discharge Instructions (Addendum)
Negative rapid strep here today. I have sent this to be cultured to confirm. It will result in 2-3 days. We would call you if it is positive.  If negative and you are feeling better at all, there is no need to take antibiotics provided. If your throat worsens, develop headache or fevers, you may start antibiotics the end of this week.  If your covid test comes back positive you do not need to take antibiotic.  Push fluids to ensure adequate hydration and keep secretions thin.  Tylenol and/or ibuprofen as needed for pain or fevers.  Throat lozenges, gargles, chloraseptic spray, warm teas, popsicles etc to help with throat pain.   If symptoms worsen or do not improve in the next week to return to be seen or to follow up with your PCP.

## 2020-08-27 LAB — CULTURE, GROUP A STREP (THRC)

## 2021-02-14 ENCOUNTER — Encounter (HOSPITAL_COMMUNITY): Payer: Self-pay | Admitting: *Deleted

## 2021-02-14 ENCOUNTER — Other Ambulatory Visit: Payer: Self-pay

## 2021-02-14 ENCOUNTER — Emergency Department (HOSPITAL_COMMUNITY)
Admission: EM | Admit: 2021-02-14 | Discharge: 2021-02-15 | Disposition: A | Discharge status: Home / Self Care | Payer: Medicaid Other | Attending: Emergency Medicine | Admitting: Emergency Medicine

## 2021-02-14 DIAGNOSIS — R1030 Lower abdominal pain, unspecified: Secondary | ICD-10-CM | POA: Insufficient documentation

## 2021-02-14 DIAGNOSIS — N898 Other specified noninflammatory disorders of vagina: Secondary | ICD-10-CM | POA: Diagnosis not present

## 2021-02-14 LAB — COMPREHENSIVE METABOLIC PANEL
ALT: 16 U/L (ref 0–44)
AST: 16 U/L (ref 15–41)
Albumin: 3.4 g/dL — ABNORMAL LOW (ref 3.5–5.0)
Alkaline Phosphatase: 47 U/L (ref 38–126)
Anion gap: 6 (ref 5–15)
BUN: 13 mg/dL (ref 6–20)
CO2: 28 mmol/L (ref 22–32)
Calcium: 9.4 mg/dL (ref 8.9–10.3)
Chloride: 104 mmol/L (ref 98–111)
Creatinine, Ser: 0.86 mg/dL (ref 0.44–1.00)
GFR, Estimated: >60 mL/min (ref >60)
Glucose, Bld: 90 mg/dL (ref 70–99)
Potassium: 3.3 mmol/L — ABNORMAL LOW (ref 3.5–5.1)
Sodium: 138 mmol/L (ref 135–145)
Total Bilirubin: 0.4 mg/dL (ref 0.3–1.2)
Total Protein: 6.7 g/dL (ref 6.5–8.1)

## 2021-02-14 LAB — CBC WITH DIFFERENTIAL/PLATELET
Abs Immature Granulocytes: 0.02 10*3/uL (ref 0.00–0.07)
Basophils Absolute: 0.1 10*3/uL (ref 0.0–0.1)
Basophils Relative: 1 %
Eosinophils Absolute: 0.1 10*3/uL (ref 0.0–0.5)
Eosinophils Relative: 3 %
HCT: 38.8 % (ref 36.0–46.0)
Hemoglobin: 12 g/dL (ref 12.0–15.0)
Immature Granulocytes: 0 %
Lymphocytes Relative: 36 %
Lymphs Abs: 1.9 10*3/uL (ref 0.7–4.0)
MCH: 28.1 pg (ref 26.0–34.0)
MCHC: 30.9 g/dL (ref 30.0–36.0)
MCV: 90.9 fL (ref 80.0–100.0)
Monocytes Absolute: 0.5 10*3/uL (ref 0.1–1.0)
Monocytes Relative: 10 %
Neutro Abs: 2.6 10*3/uL (ref 1.7–7.7)
Neutrophils Relative %: 50 %
Platelets: 354 10*3/uL (ref 150–400)
RBC: 4.27 MIL/uL (ref 3.87–5.11)
RDW: 18.7 % — ABNORMAL HIGH (ref 11.5–15.5)
WBC: 5.2 10*3/uL (ref 4.0–10.5)
nRBC: 0 % (ref 0.0–0.2)

## 2021-02-14 LAB — URINALYSIS, ROUTINE W REFLEX MICROSCOPIC
Bilirubin Urine: NEGATIVE
Glucose, UA: NEGATIVE mg/dL
Hgb urine dipstick: NEGATIVE
Ketones, ur: NEGATIVE mg/dL
Leukocytes,Ua: NEGATIVE
Nitrite: NEGATIVE
Protein, ur: NEGATIVE mg/dL
Specific Gravity, Urine: 1.014 (ref 1.005–1.030)
pH: 6 (ref 5.0–8.0)

## 2021-02-14 LAB — LIPASE, BLOOD: Lipase: 24 U/L (ref 11–51)

## 2021-02-14 NOTE — ED Provider Notes (Signed)
Emergency Medicine Provider Triage Evaluation Note  [name] [name] , a 35 y.o. female  was evaluated in triage.  Pt complains of 3 days of pelvic pain, sensation like she needs to urinate but not much is coming out, last menstrual cycle was Tuesday with heavier clots than normal.  She is on birth control.  She also has had some vaginal discharge, which she thought was yeast infection, she took over-the-counter medications but this has not helped.  Denies any nausea or vomiting.  Review of Systems  Positive: As above Negative: As above  Physical Exam  BP 131/80 (BP Location: Right Arm)   Pulse 92   Temp 98.6 F (37 C) (Oral)   Resp 18   Ht 4\' 11"  (1.499 m)   Wt 68.7 kg   LMP 02/09/2021   SpO2 98%   BMI 30.59 kg/m  Gen:   Awake, no distress   Resp:  Normal effort  MSK:   Moves extremities without difficulty  Other:  No abdominal tenderness on exam  Medical Decision Making  Medically screening exam initiated at 8:05 PM.  Appropriate orders placed.  02/11/2021 was informed that the remainder of the evaluation will be completed by another provider, this initial triage assessment does not replace that evaluation, and the importance of remaining in the ED until their evaluation is complete.      Theodoro Doing 02/14/21 2006    02/16/21, MD 02/15/21 [phone]

## 2021-02-14 NOTE — ED Triage Notes (Signed)
abd pain urinary frequency for 3-4 days  lmp lasat tuesday

## 2021-02-15 LAB — WET PREP, GENITAL
Sperm: NONE SEEN
Trich, Wet Prep: NONE SEEN
Yeast Wet Prep HPF POC: NONE SEEN

## 2021-02-15 MED ORDER — METRONIDAZOLE 500 MG PO TABS: 500.0000 mg | ORAL_TABLET | Freq: Two times a day (BID) | ORAL | 0 refills | Status: DC

## 2021-02-15 MED ORDER — POTASSIUM CHLORIDE CRYS ER 20 MEQ PO TBCR: 40.0000 meq | EXTENDED_RELEASE_TABLET | Freq: Once | ORAL | Status: DC

## 2021-02-15 NOTE — Discharge Instructions (Addendum)
1. Medications: flagyl, usual home medications 2. Treatment: rest, drink plenty of fluids, use a condom with every sexual encounter; DO NOT DRINK WHILE TAKING FLAGYL 3. Follow Up: Please followup with your primary doctor in 3 days for discussion of your diagnoses and further evaluation after today's visit; if you do not have a primary care doctor use the resource guide provided to find one; Please return to the ER for worsening symptoms, high fevers or persistent vomiting.  You have been tested for chlamydia and gonorrhea.  These results will be available in approximately 3 days.  Please inform all sexual partners if you test positive for any of these diseases.

## 2021-02-15 NOTE — ED Provider Notes (Signed)
[facility] EMERGENCY DEPARTMENT Provider Note   CSN: [id number] Arrival date & time: 02/14/21  1919     History Chief Complaint  Patient presents with  . Abdominal Pain    [name] [name] is a 35 y.o. female presents to the Emergency Department complaining of gradual, persistent, nominal cramping onset several days ago.  Patient reports her menstrual cycle began on Tuesday and was slightly heavier than normal.  Additionally she has had abdominal cramping with this which is slightly more intense than normal.  She reports just before her menstrual cycle began she had vaginal discharge that she presumed to be yeast.  She used Monistat and felt it had improved however after her menstrual cycle finished yesterday she noticed persistent discharge.  She reports some persistent lower abdominal cramping.  She reports some urinary urgency but no dysuria or hematuria.  Patient reports she sexually active with 1 female partner.  She has no significant concern for STDs.  Denies fever, chills, headache, neck pain, chest pain, shortness of breath, nausea, vomiting, diarrhea, weakness, dizziness, syncope.  The history is provided by the patient and medical records. No language interpreter was used.       Past Medical History:  Diagnosis Date  . Gonorrhea   . 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 Anxiety 10/08/2019  . Swelling of left foot 10/08/2019  . Sleep disturbance 10/08/2019  . Contraceptive patch status 10/08/2019  . VBAC, delivered 11/06/2018    Past Surgical History:  Procedure Laterality Date  . CESAREAN SECTION  05/09/2012   Procedure: CESAREAN SECTION;  Surgeon: Antionette Char, MD;  Location: [facility];  Service: Gynecology;  Laterality: N/A;  . DILATION AND CURETTAGE OF UTERUS    . WISDOM TOOTH EXTRACTION       OB History    Gravida  11   Para  5   Term  5   Preterm  0    AB  6   Living  5     SAB  1   IAB  4   Ectopic  1   Multiple  0   Live Births  5        Obstetric Comments  SAB x 1. Ectopic x 1. TAB x 4.         Family History  Problem Relation Age of Onset  . Heart disease Mother   . Cancer Father   . Anesthesia problems Neg Hx   . Other Neg Hx     Social History   Tobacco Use  . Smoking status: Never Smoker  . Smokeless tobacco: Never Used  Vaping Use  . Vaping Use: Never used  Substance Use Topics  . Alcohol use: No  . Drug use: No    Home Medications Prior to Admission medications   Medication Sig Start Date End Date Taking? Authorizing Provider  cetirizine (ZYRTEC) 10 MG tablet Take 10 mg by mouth daily.   Yes [provider]  ibuprofen (ADVIL) 200 MG tablet Take 600 mg by mouth every 6 (six) hours as needed for headache or moderate pain.   Yes [provider]  metroNIDAZOLE (FLAGYL) 500 MG tablet Take 1 tablet (500 mg total) by mouth 2 (two) times daily. One po bid x 7 days 02/15/21  Yes Desirre Eickhoff, Dahlia Client, PA-C  Multiple Vitamins-Minerals (ONE-A-DAY WOMENS) tablet Take 1 tablet by mouth daily.   Yes [provider]  Burr Medico 150-35 MCG/24HR transdermal patch Place 1 patch onto the skin once a week. 02/08/21  Yes [provider]  albuterol (PROVENTIL HFA;VENTOLIN HFA) 108 (90 Base) MCG/ACT inhaler Inhale 2 puffs into the lungs every 6 (six) hours as needed for wheezing or shortness of breath. Patient not taking: Reported on 10/08/2019 07/30/18 06/21/20  Leftwich-Kirby, Wilmer Floor, CNM  citalopram (CELEXA) 20 MG tablet Take 1 tablet (20 mg total) by mouth daily. 10/08/19 06/21/20  Leftwich-Kirby, Wilmer Floor, CNM  ferrous sulfate (FERROUSUL) 325 (65 FE) MG tablet Take 1 tablet (325 mg total) by mouth 2 (two) times daily. Patient not taking: Reported on 10/08/2019 08/28/18 06/21/20  Constant, Peggy, MD  zolpidem (AMBIEN) 10 MG tablet Take 0.5-1 tablets (5-10 mg total) by mouth at bedtime as needed  for sleep. 10/08/19 06/21/20  Leftwich-Kirby, Wilmer Floor, CNM    Allergies    Oseltamivir phosphate  Review of Systems   Review of Systems  Constitutional: Negative for appetite change, diaphoresis, fatigue, fever and unexpected weight change.  HENT: Negative for mouth sores.   Eyes: Negative for visual disturbance.  Respiratory: Negative for cough, chest tightness, shortness of breath and wheezing.   Cardiovascular: Negative for chest pain.  Gastrointestinal: Positive for abdominal pain. Negative for constipation, diarrhea, nausea and vomiting.  Endocrine: Negative for polydipsia, polyphagia and polyuria.  Genitourinary: Positive for vaginal discharge. Negative for dysuria, frequency, hematuria and urgency.  Musculoskeletal: Negative for back pain and neck stiffness.  Skin: Negative for rash.  Allergic/Immunologic: Negative for immunocompromised state.  Neurological: Negative for syncope, light-headedness and headaches.  Hematological: Does not bruise/bleed easily.  Psychiatric/Behavioral: Negative for sleep disturbance. The patient is not nervous/anxious.     Physical Exam Updated Vital Signs BP 121/81   Pulse 76   Temp 98.6 F (37 C) (Oral)   Resp 17   Ht 4\' 11"  (1.499 m)   Wt 68.7 kg   LMP 02/09/2021   SpO2 100%   BMI 30.59 kg/m   Physical Exam Vitals and nursing note reviewed. Exam conducted with a chaperone present.  Constitutional:      General: She is not in acute distress.    Appearance: She is not diaphoretic.  HENT:     Head: Normocephalic.  Eyes:     General: No scleral icterus.    Conjunctiva/sclera: Conjunctivae normal.  Cardiovascular:     Rate and Rhythm: Normal rate and regular rhythm.     Pulses: Normal pulses.          Radial pulses are 2+ on the right side and 2+ on the left side.  Pulmonary:     Effort: No tachypnea, accessory muscle usage, prolonged expiration, respiratory distress or retractions.     Breath sounds: No stridor.     Comments:  Equal chest rise. No increased work of breathing. Abdominal:     General: There is no distension.     Palpations: Abdomen is soft.     Tenderness: There is no abdominal tenderness. There is no guarding or rebound.  Genitourinary:    General: Normal vulva.     Vagina: Normal.     Cervix: Discharge present. No cervical motion tenderness.     Uterus: Normal. Not tender.      Adnexa: Right adnexa normal and left adnexa normal.     Comments: Thin, yellow, frothy, malodorous  Musculoskeletal:     Cervical back: Normal range of motion.     Comments: Moves all extremities equally and without difficulty.  Skin:  General: Skin is warm and dry.     Capillary Refill: Capillary refill takes less than 2 seconds.  Neurological:     Mental Status: She is alert.     GCS: GCS eye subscore is 4. GCS verbal subscore is 5. GCS motor subscore is 6.     Comments: Speech is clear and goal oriented.  Psychiatric:        Mood and Affect: Mood normal.     ED Results / Procedures / Treatments   Labs (all labs ordered are listed, but only abnormal results are displayed) Labs Reviewed  WET PREP, GENITAL - Abnormal; Notable for the following components:      Result Value   Clue Cells Wet Prep HPF POC PRESENT (*)    WBC, Wet Prep HPF POC MANY (*)    All other components within normal limits  CBC WITH DIFFERENTIAL/PLATELET - Abnormal; Notable for the following components:   RDW 18.7 (*)    All other components within normal limits  COMPREHENSIVE METABOLIC PANEL - Abnormal; Notable for the following components:   Potassium 3.3 (*)    Albumin 3.4 (*)    All other components within normal limits  LIPASE, BLOOD  URINALYSIS, ROUTINE W REFLEX MICROSCOPIC  POC URINE PREG, ED  GC/CHLAMYDIA PROBE AMP ([place]) NOT AT [facility]    EKG None  Radiology No results found.  Procedures Procedures   Medications Ordered in ED Medications  potassium chloride SA (KLOR-CON) CR tablet 40 mEq (has no  administration in time range)    ED Course  I have reviewed the triage vital signs and the nursing notes.  Pertinent labs & imaging results that were available during my care of the patient were reviewed by me and considered in my medical decision making (see chart for details).    MDM Rules/Calculators/A&P                           Pt presents with lower abd pain and vaginal discharge.  Pt understands that they have GC/Chlamydia cultures pending and that they will need to inform all sexual partners if results return positive. No CMT or adnexal tenderness.  No evidence of UTI. Pt not concerning for PID because hemodynamically stable and no cervical motion tenderness on pelvic exam. Pt has also been treated with Flagyl for Bacterial Vaginosis. Pt has been advised to not drink alcohol while on this medication.  Patient to be discharged with instructions to follow up with OBGYN/PCP. Discussed importance of using protection when sexually active.     Final Clinical Impression(s) / ED Diagnoses Final diagnoses:  Lower abdominal pain  Vaginal discharge    Rx / DC Orders ED Discharge Orders         Ordered    metroNIDAZOLE (FLAGYL) 500 MG tablet  2 times daily        02/15/21 0152           Deairra Halleck, Dahlia Client, PA-C 02/15/21 0201    Palumbo, April, MD 02/15/21 [id number]

## 2021-02-16 LAB — GC/CHLAMYDIA PROBE AMP (~~LOC~~) NOT AT ARMC
Chlamydia: NEGATIVE
Comment: NEGATIVE
Comment: NORMAL
Neisseria Gonorrhea: NEGATIVE

## 2022-08-25 ENCOUNTER — Other Ambulatory Visit: Payer: Self-pay

## 2022-08-25 ENCOUNTER — Encounter (HOSPITAL_COMMUNITY): Payer: Self-pay | Admitting: *Deleted

## 2022-08-25 ENCOUNTER — Inpatient Hospital Stay (HOSPITAL_COMMUNITY): Payer: Medicaid Other

## 2022-08-25 ENCOUNTER — Inpatient Hospital Stay (HOSPITAL_COMMUNITY)
Admission: AD | Admit: 2022-08-25 | Discharge: 2022-08-25 | Disposition: A | Discharge status: Home / Self Care | Payer: Medicaid Other | Attending: Obstetrics & Gynecology | Admitting: Obstetrics & Gynecology

## 2022-08-25 DIAGNOSIS — N8311 Corpus luteum cyst of right ovary: Secondary | ICD-10-CM | POA: Diagnosis not present

## 2022-08-25 DIAGNOSIS — Z3A01 Less than 8 weeks gestation of pregnancy: Secondary | ICD-10-CM | POA: Insufficient documentation

## 2022-08-25 DIAGNOSIS — Z3490 Encounter for supervision of normal pregnancy, unspecified, unspecified trimester: Secondary | ICD-10-CM

## 2022-08-25 DIAGNOSIS — O418X10 Other specified disorders of amniotic fluid and membranes, first trimester, not applicable or unspecified: Secondary | ICD-10-CM

## 2022-08-25 DIAGNOSIS — O209 Hemorrhage in early pregnancy, unspecified: Secondary | ICD-10-CM | POA: Diagnosis not present

## 2022-08-25 DIAGNOSIS — O3481 Maternal care for other abnormalities of pelvic organs, first trimester: Secondary | ICD-10-CM | POA: Insufficient documentation

## 2022-08-25 DIAGNOSIS — O208 Other hemorrhage in early pregnancy: Secondary | ICD-10-CM | POA: Diagnosis present

## 2022-08-25 LAB — URINALYSIS, ROUTINE W REFLEX MICROSCOPIC
Bilirubin Urine: NEGATIVE
Glucose, UA: NEGATIVE mg/dL
Hgb urine dipstick: NEGATIVE
Ketones, ur: NEGATIVE mg/dL
Leukocytes,Ua: NEGATIVE
Nitrite: NEGATIVE
Protein, ur: NEGATIVE mg/dL
Specific Gravity, Urine: 1.018 (ref 1.005–1.030)
pH: 7 (ref 5.0–8.0)

## 2022-08-25 LAB — WET PREP, GENITAL
Clue Cells Wet Prep HPF POC: NONE SEEN
Sperm: NONE SEEN
Trich, Wet Prep: NONE SEEN
WBC, Wet Prep HPF POC: >=10 — AB (ref <10)
Yeast Wet Prep HPF POC: NONE SEEN

## 2022-08-25 LAB — CBC
HCT: 41.1 % (ref 36.0–46.0)
Hemoglobin: 13.5 g/dL (ref 12.0–15.0)
MCH: 31.1 pg (ref 26.0–34.0)
MCHC: 32.8 g/dL (ref 30.0–36.0)
MCV: 94.7 fL (ref 80.0–100.0)
Platelets: 324 10*3/uL (ref 150–400)
RBC: 4.34 MIL/uL (ref 3.87–5.11)
RDW: 12.1 % (ref 11.5–15.5)
WBC: 7.7 10*3/uL (ref 4.0–10.5)
nRBC: 0 % (ref 0.0–0.2)

## 2022-08-25 LAB — HCG, QUANTITATIVE, PREGNANCY: hCG, Beta Chain, Quant, S: 52329 m[IU]/mL — ABNORMAL HIGH (ref <5)

## 2022-08-25 LAB — POCT PREGNANCY, URINE: Preg Test, Ur: POSITIVE — AB

## 2022-08-25 LAB — HIV ANTIBODY (ROUTINE TESTING W REFLEX): HIV Screen 4th Generation wRfx: NONREACTIVE

## 2022-08-25 NOTE — MAU Note (Signed)
[name] [name] is a 36 y.o. at Unknown here in MAU reporting: lower abdominal and back cramping with brown vaginal bleeding with wiping.  States VB began Sunday as pink mucous discharge but is now brown and stringy. LMP: 07/12/2022 Onset of complaint: Sunday Pain score: 7 Vitals:   08/25/22 1235  BP: 119/73  Pulse: 72  Resp: 19  Temp: 98.3 F (36.8 C)  SpO2: 100%     FHT:NA Lab orders placed from triage:   UA & UPT

## 2022-08-25 NOTE — Discharge Instructions (Signed)
Return to MAU: If you have heavier bleeding that soaks through more that 2 pads per hour for an hour or more If you bleed so much that you feel like you might pass out or you do pass out If you have significant abdominal pain that is not improved with Tylenol 1000 mg every 8 hours as needed for pain If you develop a fever > 100.5  

## 2022-08-25 NOTE — MAU Provider Note (Signed)
History     CSN: [id number]  Arrival date and time: 08/25/22 1130   Event Date/Time   First Provider Initiated Contact with Patient 08/25/22 1256      Chief Complaint  Patient presents with   Vaginal Bleeding   Abdominal Pain   Weak   HPI [name] is a 36 y.o. year old [id number] female at [redacted]w[redacted]d gestation by LMP who presents to MAU reporting lower abdominal & lower back cramping; rated 7/10. She reports she started having a pink mucous discharge on Sunday 08/21/2022 that is now brown, stringy d/c. She was seen by her PCP during this pregnancy on 08/17/2022. An HCG and referral to [facility] [place] [facility] was done. Her HCG was 7324 on 08/17/2022.   OB History     Gravida  12   Para  5   Term  5   Preterm  0   AB  6   Living  5      SAB  1   IAB  4   Ectopic  1   Multiple  0   Live Births  5        Obstetric Comments  SAB x 1. Ectopic x 1. TAB x 4.          Past Medical History:  Diagnosis Date   Gonorrhea    Left cornual ectopic treated with methotrexate therapy 08/02/2016    Past Surgical History:  Procedure Laterality Date   CESAREAN SECTION  05/09/2012   Procedure: CESAREAN SECTION;  Surgeon: Antionette Char, MD;  Location: [facility];  Service: Gynecology;  Laterality: N/A;   DILATION AND CURETTAGE OF UTERUS     WISDOM TOOTH EXTRACTION      Family History  Problem Relation Age of Onset   Heart disease Mother    Cancer Father    Anesthesia problems Neg Hx    Other Neg Hx     Social History   Tobacco Use   Smoking status: Never   Smokeless tobacco: Never  Vaping Use   Vaping Use: Never used  Substance Use Topics   Alcohol use: No   Drug use: No    Allergies:  Allergies  Allergen Reactions   Oseltamivir Phosphate Rash    Medications Prior to Admission  Medication Sig Dispense Refill Last Dose   Prenatal Vit-Fe Fumarate-FA (PRENATAL MULTIVITAMIN) TABS tablet Take 1 tablet by mouth daily at 12 noon.   08/25/2022   cetirizine  (ZYRTEC) 10 MG tablet Take 10 mg by mouth daily.      ibuprofen (ADVIL) 200 MG tablet Take 600 mg by mouth every 6 (six) hours as needed for headache or moderate pain.      metroNIDAZOLE (FLAGYL) 500 MG tablet Take 1 tablet (500 mg total) by mouth 2 (two) times daily. One po bid x 7 days 14 tablet 0    Multiple Vitamins-Minerals (ONE-A-DAY WOMENS) tablet Take 1 tablet by mouth daily.      XULANE 150-35 MCG/24HR transdermal patch Place 1 patch onto the skin once a week.       Review of Systems  Constitutional: Negative.   HENT: Negative.    Eyes: Negative.   Respiratory: Negative.    Cardiovascular: Negative.   Gastrointestinal: Negative.   Endocrine: Negative.   Genitourinary:  Positive for pelvic pain (L>R) and vaginal bleeding (brown discharge).  Musculoskeletal: Negative.   Skin: Negative.   Allergic/Immunologic: Negative.   Neurological: Negative.   Hematological: Negative.   Psychiatric/Behavioral: Negative.  Physical Exam   Blood pressure 122/71, pulse 69, temperature 98.3 F (36.8 C), temperature source Oral, resp. rate 19, height 4\' 11"  (1.499 m), weight 63.4 kg, last menstrual period 07/12/2022, SpO2 100 %, unknown if currently breastfeeding.  Physical Exam Vitals and nursing note reviewed. Exam conducted with a chaperone present.  Constitutional:      Appearance: Normal appearance. She is normal weight.  Cardiovascular:     Rate and Rhythm: Normal rate.  Pulmonary:     Effort: Pulmonary effort is normal.  Abdominal:     General: Abdomen is flat.     Palpations: Abdomen is soft.  Genitourinary:    General: Normal vulva.     Comments: Pelvic exam: External genitalia normal, SE: vaginal walls pink and well rugated, cervix is smooth, pink, no lesions, small amt of thick, brown, vaginal d/c -- WP, GC/CT done, cervix visually closed, Uterus is non-tender, no CMT or friability, (+) LT adnexal tenderness.  Musculoskeletal:        General: Normal range of motion.   Skin:    General: Skin is warm and dry.  Neurological:     Mental Status: She is alert and oriented to person, place, and time.  Psychiatric:        Mood and Affect: Mood normal.        Behavior: Behavior normal.        Thought Content: Thought content normal.        Judgment: Judgment normal.   MAU Course  Procedures  MDM CCUA UPT CBC ABO/Rh HCG Wet Prep GC/CT -- pending HIV -- pending OB < 14 wks 07/14/2022 with TV  Results for orders placed or performed during the hospital encounter of 08/25/22 (from the past 24 hour(s))  Pregnancy, urine POC     Status: Abnormal   Collection Time: 08/25/22 12:03 PM  Result Value Ref Range   Preg Test, Ur POSITIVE (A) NEGATIVE  Urinalysis, Routine w reflex microscopic Urine, Clean Catch     Status: Abnormal   Collection Time: 08/25/22 12:08 PM  Result Value Ref Range   Color, Urine YELLOW YELLOW   APPearance HAZY (A) CLEAR   Specific Gravity, Urine 1.018 1.005 - 1.030   pH 7.0 5.0 - 8.0   Glucose, UA NEGATIVE NEGATIVE mg/dL   Hgb urine dipstick NEGATIVE NEGATIVE   Bilirubin Urine NEGATIVE NEGATIVE   Ketones, ur NEGATIVE NEGATIVE mg/dL   Protein, ur NEGATIVE NEGATIVE mg/dL   Nitrite NEGATIVE NEGATIVE   Leukocytes,Ua NEGATIVE NEGATIVE  ABO/Rh     Status: None   Collection Time: 08/25/22 12:43 PM  Result Value Ref Range   ABO/RH(D)      A POS Performed at [facility], [address], [place], [place]   CBC     Status: None   Collection Time: 08/25/22 12:44 PM  Result Value Ref Range   WBC 7.7 4.0 - 10.5 K/uL   RBC 4.34 3.87 - 5.11 MIL/uL   Hemoglobin 13.5 12.0 - 15.0 g/dL   HCT 14/07/23 03.5 - 46.5 %   MCV 94.7 80.0 - 100.0 fL   MCH 31.1 26.0 - 34.0 pg   MCHC 32.8 30.0 - 36.0 g/dL   RDW 68.1 27.5 - 17.0 %   Platelets 324 150 - 400 K/uL   nRBC 0.0 0.0 - 0.2 %  hCG, quantitative, pregnancy     Status: Abnormal   Collection Time: 08/25/22 12:44 PM  Result Value Ref Range   hCG, Beta Chain, Quant,  S 52,329 (H) <5  mIU/mL  HIV Antibody (routine testing w rflx)     Status: None   Collection Time: 08/25/22 12:44 PM  Result Value Ref Range   HIV Screen 4th Generation wRfx Non Reactive Non Reactive  Wet prep, genital     Status: Abnormal   Collection Time: 08/25/22  1:04 PM  Result Value Ref Range   Yeast Wet Prep HPF POC NONE SEEN NONE SEEN   Trich, Wet Prep NONE SEEN NONE SEEN   Clue Cells Wet Prep HPF POC NONE SEEN NONE SEEN   WBC, Wet Prep HPF POC >=10 (A) <10   Sperm NONE SEEN     US OB LESS THAN 14 WEEKS WITH OB TRANSVAGINAL  Result Date: 08/25/2022 CLINICAL DATA:  Abdominal plain and vaginal bleeding. EXAM: OBSTETRIC <14 WK [place] AND TRANSVAGINAL OB US TECHNIQUE: Both transabdominal and transvaginal ultrasound examinations were performed for complete evaluation of the gestation as well as the maternal uterus, adnexal regions, and pelvic cul-de-sac. Transvaginal technique was performed to assess early pregnancy. COMPARISON:  None Available. FINDINGS: Intrauterine gestational sac: Single Yolk sac:  Visualized. Embryo:  Not Visualized. Cardiac Activity: Not Visualized. MSD: 11.8 mm   6 w   0 d Subchorionic hemorrhage:  Small subchorionic hemorrhage is noted. Maternal uterus/adnexae: Corpus luteum cyst seen in right ovary. Left ovary is unremarkable. Trace free fluid is noted which most likely is physiologic. IMPRESSION: Probable early intrauterine gestational sac with yolk sac, but no fetal pole or cardiac activity yet visualized. Recommend follow-up quantitative B-HCG levels and follow-up US in 14 days to assess viability. This recommendation follows SRU consensus guidelines: Diagnostic Criteria for Nonviable Pregnancy Early in the First Trimester. Malva Limes Med 2013; 119:4174-08. Electronically Signed   By: Lupita Raider M.D.   On: 08/25/2022 14:33     Assessment and Plan  1. Intrauterine pregnancy - Information provided on prenatal care   2. Vaginal bleeding affecting early pregnancy - Information  provided on VB in pregnancy - Return to MAU: If you have heavier bleeding that soaks through more that 2 pads per hour for an hour or more If you bleed so much that you feel like you might pass out or you do pass out If you have significant abdominal pain that is not improved with Tylenol 1000 mg every 8 hours as needed for pain If you develop a fever > 100.5  - Advised pelvic rest until bleeding stops and you are approved to resume SI    3. Subchorionic hematoma in first trimester, single or unspecified fetus - Information provided on [facility] and threatened miscarriage   4. [redacted] weeks gestation of pregnancy   - Discharge patient - Keep scheduled appt with [facility] on 09/15/2022; U/S will be repeated at that appt - Patient verbalized an understanding of the plan of care and agrees.   Raelyn Mora, CNM 08/25/2022, 12:56 PM

## 2022-08-26 LAB — RPR: RPR Ser Ql: NONREACTIVE

## 2022-08-26 LAB — ABO/RH: ABO/RH(D): A POS

## 2022-08-26 LAB — GC/CHLAMYDIA PROBE AMP (~~LOC~~) NOT AT ARMC
Chlamydia: NEGATIVE
Comment: NEGATIVE
Comment: NORMAL
Neisseria Gonorrhea: NEGATIVE

## 2022-09-15 ENCOUNTER — Other Ambulatory Visit: Payer: Self-pay

## 2022-09-15 ENCOUNTER — Ambulatory Visit: Payer: Medicaid Other

## 2022-09-15 ENCOUNTER — Telehealth: Payer: Self-pay

## 2022-09-15 ENCOUNTER — Encounter (HOSPITAL_COMMUNITY): Payer: Self-pay | Admitting: Obstetrics and Gynecology

## 2022-09-15 ENCOUNTER — Ambulatory Visit (INDEPENDENT_AMBULATORY_CARE_PROVIDER_SITE_OTHER): Payer: Managed Care, Other (non HMO)

## 2022-09-15 VITALS — BP 131/87 | HR 76 | Ht 59.0 in | Wt 143.2 lb

## 2022-09-15 DIAGNOSIS — O3680X0 Pregnancy with inconclusive fetal viability, not applicable or unspecified: Secondary | ICD-10-CM

## 2022-09-15 DIAGNOSIS — Z3A00 Weeks of gestation of pregnancy not specified: Secondary | ICD-10-CM | POA: Diagnosis not present

## 2022-09-15 DIAGNOSIS — Z3480 Encounter for supervision of other normal pregnancy, unspecified trimester: Secondary | ICD-10-CM | POA: Diagnosis not present

## 2022-09-15 MED ORDER — PREPLUS 27-1 MG PO TABS: 1.0000 | ORAL_TABLET | Freq: Every day | ORAL | 13 refills | Status: DC

## 2022-09-15 MED ORDER — BLOOD PRESSURE KIT DEVI: 1.0000 | 0 refills | Status: AC

## 2022-09-15 NOTE — Telephone Encounter (Signed)
-----   Message from Warden Fillers, MD sent at 09/15/2022  2:39 PM EST ----- Regarding: D and E Pt has blighted ovum, pt desires D and E with ANORA testing

## 2022-09-15 NOTE — Progress Notes (Signed)
New OB Intake  I connected with [name] [name] on 09/15/22 at  1:10 PM EST by in person and verified that I am speaking with the correct person using two identifiers. Nurse is located at [facility] and pt is located at [place].  I discussed the limitations, risks, security and privacy concerns of performing an evaluation and management service by telephone and the availability of in person appointments. I also discussed with the patient that there may be a patient responsible charge related to this service. The patient expressed understanding and agreed to proceed.  I explained I am completing New OB Intake today. IUP with no fetal pole identified. No changes in comparison to previous scan on 12/7. Pt is G12/P5065. I reviewed her allergies, medications, Medical/Surgical/OB history, and appropriate screenings. I informed her of [facility] services. [facility] information placed in AVS. Based on history, this is a low risk pregnancy.  Patient Active Problem List   Diagnosis Date Noted   Anxiety 10/08/2019   Swelling of left foot 10/08/2019   Sleep disturbance 10/08/2019   Contraceptive patch status 10/08/2019   VBAC, delivered 11/06/2018    Concerns addressed today  Delivery Plans Plans to deliver at [facility]. Patient given information for [facility] Healthy Baby website for more information about [facility]. Patient is not interested in water birth. Offered upcoming OB visit with CNM to discuss further.  MyChart/Babyscripts MyChart access verified. I explained pt will have some visits in office and some virtually. Babyscripts instructions given and order placed. Patient verifies receipt of registration text/e-mail. Account successfully created and app downloaded.  Blood Pressure Cuff/Weight Scale Blood pressure cuff ordered for patient to pick-up from [employer]. Explained after first prenatal appt pt will check weekly and document in Babyscripts. Patient does have weight scale.  Anatomy  US Explained first scheduled [place] will be around 19 weeks. Dating and viability scan performed today. Anatomy US TBD.   Labs Discussed Avelina Laine genetic screening with patient. Would like both Panorama and Horizon drawn at new OB visit. Routine prenatal labs needed.  COVID Vaccine Patient has not had COVID vaccine.   Is patient a CenteringPregnancy candidate?  Declined Declined due to Group setting Not a candidate due to  If accepted,   Social Determinants of Health Food Insecurity: Patient denies food insecurity. WIC Referral: Patient is interested in referral to [facility].  Transportation: Patient denies transportation needs. Childcare: Discussed no children allowed at ultrasound appointments. Offered childcare services; patient declines childcare services at this time.  First visit review I reviewed new OB appt with patient. I explained they will have a provider visit that includes pap smear, prenatal labs, std screening, genetic screening, and discuss plan discuss plan of care for pregnancy. Explained pt will be seen by Scheryl Darter at first visit; encounter routed to appropriate provider. Explained that patient will be seen by pregnancy navigator following visit with provider.   Hamilton Capri, RN 09/15/2022  1:15 PM

## 2022-09-15 NOTE — Telephone Encounter (Signed)
Call and spoke to patient, surgery date, time, location and preop instructions given. Patient expressed understanding. 

## 2022-09-15 NOTE — H&P (Signed)
[name] [name] is an 36 y.o. female here for scheduled D&E. Patient diagnosed with a blighted ovum on 09/15/22 and elected for surgical management. Patient reports feeling well and is without complaints. She denies any vaginal bleeding or cramping.    Menstrual History: Patient's last menstrual period was 07/12/2022.    Past Medical History:  Diagnosis Date   Gonorrhea    Left cornual ectopic treated with methotrexate therapy 08/02/2016    Past Surgical History:  Procedure Laterality Date   CESAREAN SECTION  05/09/2012   Procedure: CESAREAN SECTION;  Surgeon: [name] Crocker, MD;  Location: [facility];  Service: Gynecology;  Laterality: N/A;   WISDOM TOOTH EXTRACTION      Family History  Problem Relation Age of Onset   Heart disease Mother    Heart disease Father    Brain cancer Father    Diabetes Maternal Grandmother    Hypertension Maternal Grandmother    Breast cancer Paternal Grandmother    Alzheimer's disease Paternal Grandmother    Anesthesia problems Neg Hx    Other Neg Hx     Social History:  reports that she has never smoked. She has never used smokeless tobacco. She reports that she does not drink alcohol and does not use drugs.  Allergies:  Allergies  Allergen Reactions   Tamiflu [Oseltamivir Phosphate] Rash    Medications Prior to Admission  Medication Sig Dispense Refill Last Dose   Prenatal Vit-Fe Fumarate-FA (PREPLUS) 27-1 MG TABS Take 1 tablet by mouth daily. 30 tablet 13    Blood Pressure Monitoring (BLOOD PRESSURE KIT) DEVI 1 kit by Does not apply route once a week. 1 each 0     Review of Systems See pertinent in HPI. All other systems reviewed and non contributory Blood pressure 120/75, pulse 83, temperature 98.8 F (37.1 C), temperature source Oral, resp. rate 18, height _0  (1.499 m), weight 64.9 kg, last menstrual period 07/12/2022, SpO2 98 %, unknown if currently breastfeeding. Physical Exam GENERAL: Well-developed, well-nourished female in no  acute distress.  LUNGS: Clear to auscultation bilaterally.  HEART: Regular rate and rhythm. ABDOMEN: Soft, nontender, nondistended. No organomegaly. PELVIC: Deferred to OR EXTREMITIES: No cyanosis, clubbing, or edema, 2+ distal pulses.  Results for orders placed or performed during the hospital encounter of 09/16/22 (from the past 24 hour(s))  Type and screen     Status: None (Preliminary result)   Collection Time: 09/16/22  8:02 AM  Result Value Ref Range   ABO/RH(D) PENDING    Antibody Screen PENDING    Sample Expiration      09/19/2022,2359 Performed at [facility], [place] [address], [place], [place] [address]   CBC     Status: None   Collection Time: 09/16/22  8:21 AM  Result Value Ref Range   WBC 6.4 4.0 - 10.5 K/uL   RBC 4.41 3.87 - 5.11 MIL/uL   Hemoglobin 14.0 12.0 - 15.0 g/dL   HCT 43.4 36.0 - 46.0 %   MCV 98.4 80.0 - 100.0 fL   MCH 31.7 26.0 - 34.0 pg   MCHC 32.3 30.0 - 36.0 g/dL   RDW 12.7 11.5 - 15.5 %   Platelets 276 150 - 400 K/uL   nRBC 0.0 0.0 - 0.2 %    No results found. US OB LESS THAN 14 WEEKS WITH OB TRANSVAGINAL  Result Date: 08/25/2022 CLINICAL DATA:  Abdominal plain and vaginal bleeding. EXAM: OBSTETRIC <14 WK [place] AND TRANSVAGINAL OB US TECHNIQUE: Both transabdominal and transvaginal ultrasound  examinations were performed for complete evaluation of the gestation as well as the maternal uterus, adnexal regions, and pelvic cul-de-sac. Transvaginal technique was performed to assess early pregnancy. COMPARISON:  None Available. FINDINGS: Intrauterine gestational sac: Single Yolk sac:  Visualized. Embryo:  Not Visualized. Cardiac Activity: Not Visualized. MSD: 11.8 mm   6 w   0 d Subchorionic hemorrhage:  Small subchorionic hemorrhage is noted. Maternal uterus/adnexae: Corpus luteum cyst seen in right ovary. Left ovary is unremarkable. Trace free fluid is noted which most likely is physiologic. IMPRESSION: Probable early intrauterine gestational sac with yolk  sac, but no fetal pole or cardiac activity yet visualized. Recommend follow-up quantitative B-HCG levels and follow-up US in 14 days to assess viability. This recommendation follows SRU consensus guidelines: Diagnostic Criteria for Nonviable Pregnancy Early in the First Trimester. [employer] Med 2013; 013:1438-88. Electronically Signed   By: [name] [name] M.D.   On: 08/25/2022 14:33    09/15/22 office ultrasound  Irregular shaped gestational sac measuring 70w4dwith yolk sac. No fetal pole Consistent with blighted ovum  Assessment/Plan: 35yo with blighted ovum here for D&E - Risks, benefits and alternatives were explained including but not limited to risks of bleeding, infection, uterine perforation and damage to adjacent organs.  - Patient verbalized understanding and all questions were answered - Patient also desires chromosomal studies on fetal tissue  [name] [name] 09/16/2022, 9:01 AM

## 2022-09-15 NOTE — Pre-Procedure Instructions (Signed)
PCP - Dr. Norva Riffle Cardiologist - pt denies  PPM/ICD - pt denies Device Orders - n/a Rep Notified - n/a  EKG - 2019 Stress Test - pt denies ECHO - pt denies Cardiac Cath - pt denies  Sleep Study/CPAP - pt denies  Diabetic- pt denies Fasting Blood Sugar -  Checks Blood Sugar _____ times a day  Last dose of GLP1 agonist-  n/a GLP1 instructions: n/a  Blood Thinner Instructions:pt denies Aspirin Instructions:pt denies  ERAS Protcol - NPO after Midnight  COVID TEST- n/a   Anesthesia review: NO   Patient verbally denies any shortness of breath, fever, cough and chest pain during phone call     -------------  SDW INSTRUCTIONS given:   Your procedure is scheduled on 09/16/22.             Report to [facility] Main Entrance "A" at  7:20  A.M., and check in at the Admitting office.             Call this number if you have problems the morning of surgery:             [phone]               Remember:             Do not eat or drink after midnight the night before your surgery                          Take these medicines the morning of surgery with A SIP OF WATER NONE  As of today, STOP taking any Aspirin (unless otherwise instructed by your surgeon) Aleve, Naproxen, Ibuprofen, Motrin, Advil, Goody's, BC's, all herbal medications, fish oil, and all vitamins.                         Do not wear jewelry, make up, or nail polish            Do not wear lotions, powders, perfumes/colognes, or deodorant.            Do not shave 48 hours prior to surgery.  Men may shave face and neck.            Do not bring valuables to the hospital.            [facility] is not responsible for any belongings or valuables.   Do NOT Smoke (Tobacco/Vaping) 24 hours prior to your procedure If you use a CPAP at night, you may bring all equipment for your overnight stay.   Contacts, glasses, dentures or partials may not be worn into surgery.      For patients admitted to the hospital,  discharge time will be determined by your treatment team.   Patients discharged the day of surgery will not be allowed to drive home, and someone needs to stay with them for 24 hours.     Special instructions:   House- Preparing For Surgery  Oral Hygiene is also important to reduce your risk of infection.  Remember - BRUSH YOUR TEETH THE MORNING OF SURGERY WITH YOUR REGULAR TOOTHPASTE   Before surgery, you can play an important role. Because skin is not sterile, your skin needs to be as free of germs as possible. You can reduce the number of germs on your skin by washing with Dial (or any antibacterial) Soap before surgery.    Please do not use  if you have an allergy to antibacterial soaps. If your skin becomes reddened/irritated stop using the Antibacterial Soap  Do not shave (including legs and underarms) for at least 48 hours prior to surgery. It is OK to shave your face.   Please follow these instructions carefully.              Shower the NIGHT BEFORE SURGERY and the MORNING OF SURGERY with DIAL Soap. Wash your body and hair with your normal shampoo/soap then rinse. Using a clean wash cloth wash from your neck down using the antibacterial, do not wash private area with the clean wash cloth.    Pat yourself dry with a CLEAN TOWEL.   Wear CLEAN PAJAMAS to bed the night before surgery.   Place CLEAN SHEETS on your bed the night of your surgery and DO NOT SLEEP WITH PETS.     Day of Surgery: Please shower morning of surgery using antibacterial soap Wear Clean/Comfortable clothing the morning of surgery Do not apply any deodorants/lotions.   Remember to brush your teeth WITH YOUR REGULAR TOOTHPASTE.   Questions were answered. Patient verbalized understanding of instructions.

## 2022-09-15 NOTE — Anesthesia Preprocedure Evaluation (Addendum)
Anesthesia Evaluation  Patient identified by MRN, date of birth, ID band Patient awake    Reviewed: Allergy & Precautions, NPO status , Patient's Chart, lab work & pertinent test results  Airway Mallampati: II  TM Distance: >3 FB Neck ROM: Full    Dental no notable dental hx. (+) Teeth Intact, Dental Advisory Given   Pulmonary    Pulmonary exam normal breath sounds clear to auscultation       Cardiovascular Normal cardiovascular exam Rhythm:Regular Rate:Normal     Neuro/Psych   Anxiety        GI/Hepatic   Endo/Other    Renal/GU      Musculoskeletal   Abdominal   Peds  Hematology Lab Results      Component                Value               Date                      WBC                      7.7                 08/25/2022                HGB                      13.5                08/25/2022                HCT                      41.1                08/25/2022                MCV                      94.7                08/25/2022                PLT                      324                 08/25/2022              Anesthesia Other Findings ALL: Tamiflu  Reproductive/Obstetrics 7.5 wk missed AB                              Anesthesia Physical Anesthesia Plan  ASA: 2  Anesthesia Plan: General   Post-op Pain Management: Toradol IV (intra-op)* and Ofirmev IV (intra-op)*   Induction: Intravenous  PONV Risk Score and Plan: 4 or greater and Treatment may vary due to age or medical condition, Ondansetron, Dexamethasone and Midazolam  Airway Management Planned: LMA  Additional Equipment: None  Intra-op Plan:   Post-operative Plan:   Informed Consent: I have reviewed the patients History and Physical, chart, labs and discussed the procedure including the risks, benefits and alternatives for the proposed anesthesia with the patient or authorized representative who has indicated his/her  understanding and acceptance.  Dental advisory given  Plan Discussed with: CRNA, Anesthesiologist and Surgeon  Anesthesia Plan Comments:         Anesthesia Quick Evaluation

## 2022-09-16 ENCOUNTER — Ambulatory Visit (HOSPITAL_BASED_OUTPATIENT_CLINIC_OR_DEPARTMENT_OTHER): Payer: Medicaid Other | Admitting: Anesthesiology

## 2022-09-16 ENCOUNTER — Encounter (HOSPITAL_COMMUNITY)
Admission: RE | Disposition: A | Discharge status: Home / Self Care | Payer: Self-pay | Source: Ambulatory Visit | Attending: Obstetrics and Gynecology

## 2022-09-16 ENCOUNTER — Other Ambulatory Visit: Payer: Self-pay

## 2022-09-16 ENCOUNTER — Encounter (HOSPITAL_COMMUNITY): Payer: Self-pay | Admitting: Obstetrics and Gynecology

## 2022-09-16 ENCOUNTER — Ambulatory Visit (HOSPITAL_COMMUNITY)
Admission: RE | Admit: 2022-09-16 | Discharge: 2022-09-16 | Disposition: A | Discharge status: Home / Self Care | Payer: Medicaid Other | Source: Ambulatory Visit | Attending: Obstetrics and Gynecology | Admitting: Obstetrics and Gynecology

## 2022-09-16 ENCOUNTER — Ambulatory Visit (HOSPITAL_COMMUNITY): Payer: Medicaid Other | Admitting: Anesthesiology

## 2022-09-16 DIAGNOSIS — O020 Blighted ovum and nonhydatidiform mole: Secondary | ICD-10-CM | POA: Diagnosis present

## 2022-09-16 DIAGNOSIS — O021 Missed abortion: Secondary | ICD-10-CM

## 2022-09-16 DIAGNOSIS — Z3A01 Less than 8 weeks gestation of pregnancy: Secondary | ICD-10-CM | POA: Diagnosis not present

## 2022-09-16 DIAGNOSIS — Z3A11 11 weeks gestation of pregnancy: Secondary | ICD-10-CM

## 2022-09-16 DIAGNOSIS — Z01812 Encounter for preprocedural laboratory examination: Secondary | ICD-10-CM

## 2022-09-16 HISTORY — PX: DILATION AND EVACUATION: SHX1459

## 2022-09-16 LAB — CBC
HCT: 43.4 % (ref 36.0–46.0)
Hemoglobin: 14 g/dL (ref 12.0–15.0)
MCH: 31.7 pg (ref 26.0–34.0)
MCHC: 32.3 g/dL (ref 30.0–36.0)
MCV: 98.4 fL (ref 80.0–100.0)
Platelets: 276 10*3/uL (ref 150–400)
RBC: 4.41 MIL/uL (ref 3.87–5.11)
RDW: 12.7 % (ref 11.5–15.5)
WBC: 6.4 10*3/uL (ref 4.0–10.5)
nRBC: 0 % (ref 0.0–0.2)

## 2022-09-16 LAB — BETA HCG QUANT (REF LAB): hCG Quant: 40210 m[IU]/mL

## 2022-09-16 LAB — TYPE AND SCREEN
ABO/RH(D): A POS
Antibody Screen: NEGATIVE

## 2022-09-16 SURGERY — DILATION AND EVACUATION, UTERUS
Anesthesia: General | Site: Vagina

## 2022-09-16 MED ORDER — KETOROLAC TROMETHAMINE 30 MG/ML IJ SOLN
INTRAMUSCULAR | Status: AC
  Filled 2022-09-16: qty 1

## 2022-09-16 MED ORDER — PROPOFOL 10 MG/ML IV BOLUS
INTRAVENOUS | Status: AC
  Filled 2022-09-16: qty 20

## 2022-09-16 MED ORDER — KETOROLAC TROMETHAMINE 30 MG/ML IJ SOLN
INTRAMUSCULAR | Status: DC | PRN
  Administered 2022-09-16: 30 mg via INTRAVENOUS

## 2022-09-16 MED ORDER — KETOROLAC TROMETHAMINE 30 MG/ML IJ SOLN: 30.0000 mg | Freq: Once | INTRAMUSCULAR | Status: DC | PRN

## 2022-09-16 MED ORDER — FENTANYL CITRATE (PF) 250 MCG/5ML IJ SOLN
INTRAMUSCULAR | Status: AC
  Filled 2022-09-16: qty 5

## 2022-09-16 MED ORDER — ORAL CARE MOUTH RINSE: 15.0000 mL | Freq: Once | OROMUCOSAL | Status: AC

## 2022-09-16 MED ORDER — POVIDONE-IODINE 10 % EX SWAB: 2.0000 | Freq: Once | CUTANEOUS | Status: DC

## 2022-09-16 MED ORDER — CHLORHEXIDINE GLUCONATE 0.12 % MT SOLN
OROMUCOSAL | Status: AC
  Administered 2022-09-16: 15 mL via OROMUCOSAL
  Filled 2022-09-16: qty 15

## 2022-09-16 MED ORDER — OXYCODONE HCL 5 MG/5ML PO SOLN: 5.0000 mg | Freq: Once | ORAL | Status: DC | PRN

## 2022-09-16 MED ORDER — MIDAZOLAM HCL 2 MG/2ML IJ SOLN
INTRAMUSCULAR | Status: AC
  Filled 2022-09-16: qty 2

## 2022-09-16 MED ORDER — ONDANSETRON HCL 4 MG/2ML IJ SOLN
INTRAMUSCULAR | Status: DC | PRN
  Administered 2022-09-16: 4 mg via INTRAVENOUS

## 2022-09-16 MED ORDER — DOXYCYCLINE HYCLATE 100 MG IV SOLR
200.0000 mg | INTRAVENOUS | Status: AC
  Administered 2022-09-16: 200 mg via INTRAVENOUS
  Filled 2022-09-16: qty 200

## 2022-09-16 MED ORDER — MIDAZOLAM HCL 2 MG/2ML IJ SOLN
INTRAMUSCULAR | Status: DC | PRN
  Administered 2022-09-16: 2 mg via INTRAVENOUS

## 2022-09-16 MED ORDER — OXYCODONE-ACETAMINOPHEN 5-325 MG PO TABS: 1.0000 | ORAL_TABLET | Freq: Four times a day (QID) | ORAL | 0 refills | Status: DC | PRN

## 2022-09-16 MED ORDER — ONDANSETRON HCL 4 MG/2ML IJ SOLN: 4.0000 mg | Freq: Once | INTRAMUSCULAR | Status: DC | PRN

## 2022-09-16 MED ORDER — CHLORHEXIDINE GLUCONATE 0.12 % MT SOLN: 15.0000 mL | Freq: Once | OROMUCOSAL | Status: AC

## 2022-09-16 MED ORDER — OXYCODONE HCL 5 MG PO TABS: 5.0000 mg | ORAL_TABLET | Freq: Once | ORAL | Status: DC | PRN

## 2022-09-16 MED ORDER — LACTATED RINGERS IV SOLN: INTRAVENOUS | Status: DC

## 2022-09-16 MED ORDER — ROCURONIUM BROMIDE 10 MG/ML (PF) SYRINGE
PREFILLED_SYRINGE | INTRAVENOUS | Status: AC
  Filled 2022-09-16: qty 10

## 2022-09-16 MED ORDER — IBUPROFEN 600 MG PO TABS: 600.0000 mg | ORAL_TABLET | Freq: Four times a day (QID) | ORAL | 3 refills | Status: AC | PRN

## 2022-09-16 MED ORDER — LIDOCAINE 2% (20 MG/ML) 5 ML SYRINGE
INTRAMUSCULAR | Status: AC
  Filled 2022-09-16: qty 5

## 2022-09-16 MED ORDER — DEXAMETHASONE SODIUM PHOSPHATE 10 MG/ML IJ SOLN
INTRAMUSCULAR | Status: DC | PRN
  Administered 2022-09-16: 5 mg via INTRAVENOUS

## 2022-09-16 MED ORDER — HYDROMORPHONE HCL 1 MG/ML IJ SOLN: 0.2500 mg | INTRAMUSCULAR | Status: DC | PRN

## 2022-09-16 MED ORDER — LIDOCAINE 2% (20 MG/ML) 5 ML SYRINGE
INTRAMUSCULAR | Status: DC | PRN
  Administered 2022-09-16: 100 mg via INTRAVENOUS

## 2022-09-16 MED ORDER — PROPOFOL 10 MG/ML IV BOLUS
INTRAVENOUS | Status: DC | PRN
  Administered 2022-09-16: 30 mg via INTRAVENOUS
  Administered 2022-09-16: 150 mg via INTRAVENOUS

## 2022-09-16 MED ORDER — DEXAMETHASONE SODIUM PHOSPHATE 10 MG/ML IJ SOLN
INTRAMUSCULAR | Status: AC
  Filled 2022-09-16: qty 1

## 2022-09-16 MED ORDER — CHLOROPROCAINE HCL 1 % IJ SOLN
INTRAMUSCULAR | Status: AC
  Filled 2022-09-16: qty 30

## 2022-09-16 MED ORDER — FENTANYL CITRATE (PF) 250 MCG/5ML IJ SOLN
INTRAMUSCULAR | Status: DC | PRN
  Administered 2022-09-16: 50 ug via INTRAVENOUS

## 2022-09-16 MED ORDER — CHLOROPROCAINE HCL 1 % IJ SOLN
INTRAMUSCULAR | Status: DC | PRN
  Administered 2022-09-16: 10 mL

## 2022-09-16 MED ORDER — ONDANSETRON HCL 4 MG/2ML IJ SOLN
INTRAMUSCULAR | Status: AC
  Filled 2022-09-16: qty 2

## 2022-09-16 SURGICAL SUPPLY — 22 items
CATH ROBINSON RED A/P 16FR (CATHETERS) ×2 IMPLANT
FILTER UTR ASPR ASSEMBLY (MISCELLANEOUS) ×2 IMPLANT
GLOVE BIOGEL PI IND STRL 6.5 (GLOVE) ×2 IMPLANT
GLOVE BIOGEL PI IND STRL 7.0 (GLOVE) ×2 IMPLANT
GLOVE SURG SS PI 6.5 STRL IVOR (GLOVE) ×2 IMPLANT
GOWN STRL REUS W/ TWL LRG LVL3 (GOWN DISPOSABLE) ×4 IMPLANT
GOWN STRL REUS W/TWL LRG LVL3 (GOWN DISPOSABLE) ×4
HOSE CONNECTING 18IN BERKELEY (TUBING) ×2 IMPLANT
KIT BERKELEY 1ST TRI 3/8 NO TR (MISCELLANEOUS) ×2 IMPLANT
KIT BERKELEY 1ST TRIMESTER 3/8 (MISCELLANEOUS) ×2 IMPLANT
NS IRRIG 1000ML POUR BTL (IV SOLUTION) ×2 IMPLANT
PACK VAGINAL MINOR WOMEN LF (CUSTOM PROCEDURE TRAY) ×2 IMPLANT
PAD OB MATERNITY 4.3X12.25 (PERSONAL CARE ITEMS) ×2 IMPLANT
SET BERKELEY SUCTION TUBING (SUCTIONS) ×2 IMPLANT
SPIKE FLUID TRANSFER (MISCELLANEOUS) ×2 IMPLANT
TOWEL GREEN STERILE FF (TOWEL DISPOSABLE) ×4 IMPLANT
UNDERPAD 30X36 HEAVY ABSORB (UNDERPADS AND DIAPERS) ×2 IMPLANT
VACURETTE 10 RIGID CVD (CANNULA) IMPLANT
VACURETTE 6 ASPIR F TIP BERK (CANNULA) IMPLANT
VACURETTE 7MM CVD STRL WRAP (CANNULA) IMPLANT
VACURETTE 8 RIGID CVD (CANNULA) IMPLANT
VACURETTE 9 RIGID CVD (CANNULA) IMPLANT

## 2022-09-16 NOTE — Transfer of Care (Signed)
Immediate Anesthesia Transfer of Care Note  Patient: [name] [name]  Procedure(s) Performed: DILATATION AND EVACUATION (Vagina ) CHROMOSOME STUDIES (Vagina )  Patient Location: PACU  Anesthesia Type:General  Level of Consciousness: oriented and drowsy  Airway & Oxygen Therapy: Patient Spontanous Breathing  Post-op Assessment: Report given to RN  Post vital signs: Reviewed and stable  Last Vitals:  Vitals Value Taken Time  BP 114/70 09/16/22 1040  Temp    Pulse 78 09/16/22 1041  Resp 9 09/16/22 1041  SpO2 100 % 09/16/22 1041  Vitals shown include unvalidated device data.  Last Pain:  Vitals:   09/16/22 0808  TempSrc:   PainSc: 0-No pain         Complications: No notable events documented.

## 2022-09-16 NOTE — Anesthesia Postprocedure Evaluation (Signed)
Anesthesia Post Note  Patient: [name] [name]  Procedure(s) Performed: DILATATION AND EVACUATION (Vagina ) CHROMOSOME STUDIES (Vagina )     Patient location during evaluation: PACU Anesthesia Type: General Level of consciousness: awake and alert Pain management: pain level controlled Vital Signs Assessment: post-procedure vital signs reviewed and stable Respiratory status: spontaneous breathing, nonlabored ventilation, respiratory function stable and patient connected to nasal cannula oxygen Cardiovascular status: blood pressure returned to baseline and stable Postop Assessment: no apparent nausea or vomiting Anesthetic complications: no  No notable events documented.  Last Vitals:  Vitals:   09/16/22 1045 09/16/22 1100  BP: 125/79 119/75  Pulse: 73 73  Resp: 12 14  Temp:  36.8 C  SpO2: 98% 100%    Last Pain:  Vitals:   09/16/22 1100  TempSrc:   PainSc: 0-No pain                 Trevor Iha

## 2022-09-16 NOTE — Op Note (Addendum)
[name] [name] PROCEDURE DATE: 09/16/2022  PREOPERATIVE DIAGNOSIS: 7 week missed abortion. POSTOPERATIVE DIAGNOSIS: The same. PROCEDURE:     Dilation and Evacuation. SURGEON:  Dr. Catalina Antigua  INDICATIONS: 36 y.o. [id number] MAB at [redacted] weeks gestation, needing surgical completion.  Risks of surgery were discussed with the patient including but not limited to: bleeding which may require transfusion; infection which may require antibiotics; injury to uterus or surrounding organs;need for additional procedures including laparotomy or laparoscopy; possibility of intrauterine scarring which may impair future fertility; and other postoperative/anesthesia complications. Written informed consent was obtained.    FINDINGS:  An 8-week size anteverted uterus, moderate amounts of products of conception, specimen sent to pathology.  ANESTHESIA:    Monitored intravenous sedation, paracervical block. INTRAVENOUS FLUIDS:  400 ml of LR ESTIMATED BLOOD LOSS:  Less than 20 ml. SPECIMENS:  Products of conception sent to pathology COMPLICATIONS:  None immediate.  PROCEDURE DETAILS:  The patient received intravenous antibiotics while in the preoperative area.  She was then taken to the operating room where general anesthesia was administered and was found to be adequate.  After an adequate timeout was performed, she was placed in the dorsal lithotomy position and examined; then prepped and draped in the sterile manner.   Her bladder was catheterized for an unmeasured amount of clear, yellow urine. A vaginal speculum was then placed in the patient's vagina and a single tooth tenaculum was applied to the anterior lip of the cervix.  A paracervical block using 0.5% Marcaine was administered. The cervix was gently dilated to accommodate an 8 mm suction curette that was gently advanced to the uterine fundus.  The suction device was then activated and curette slowly rotated to clear the uterus of products of conception.  A  sharp curettage was then performed to confirm complete emptying of the uterus. There was minimal bleeding noted and the tenaculum removed with good hemostasis noted.   All instruments were removed from the patient's vagina. The patient tolerated the procedure well and was taken to the recovery area awake, and in stable condition. Portion of specimen sent for Anora testing  The patient will be discharged to home as per PACU criteria.  Routine postoperative instructions given. She will follow up in the clinic in 2 weeks for postoperative evaluation.

## 2022-09-16 NOTE — Anesthesia Procedure Notes (Signed)
Procedure Name: LMA Insertion Date/Time: 09/16/2022 10:07 AM  Performed by: De Nurse, CRNAPre-anesthesia Checklist: Patient identified, Emergency Drugs available, Suction available and Patient being monitored Patient Re-evaluated:Patient Re-evaluated prior to induction Oxygen Delivery Method: Circle System Utilized Preoxygenation: Pre-oxygenation with 100% oxygen Induction Type: IV induction Ventilation: Mask ventilation without difficulty LMA: LMA inserted LMA Size: 4.0 Number of attempts: 1 Placement Confirmation: positive ETCO2 Tube secured with: Tape Dental Injury: Teeth and Oropharynx as per pre-operative assessment

## 2022-09-17 ENCOUNTER — Encounter (HOSPITAL_COMMUNITY): Payer: Self-pay | Admitting: Obstetrics and Gynecology

## 2022-09-20 LAB — SURGICAL PATHOLOGY

## 2022-09-27 ENCOUNTER — Encounter: Payer: Medicaid Other | Admitting: Obstetrics and Gynecology

## 2022-09-29 ENCOUNTER — Ambulatory Visit (INDEPENDENT_AMBULATORY_CARE_PROVIDER_SITE_OTHER): Payer: Medicaid Other | Admitting: Obstetrics and Gynecology

## 2022-09-29 ENCOUNTER — Encounter: Payer: Self-pay | Admitting: Obstetrics and Gynecology

## 2022-09-29 ENCOUNTER — Encounter: Payer: Medicaid Other | Admitting: Obstetrics and Gynecology

## 2022-09-29 VITALS — BP 127/77 | HR 82 | Wt 141.5 lb

## 2022-09-29 DIAGNOSIS — Z9889 Other specified postprocedural states: Secondary | ICD-10-CM

## 2022-09-29 LAB — ANORA MISCARRIAGE TEST - FRESH

## 2022-09-29 NOTE — Progress Notes (Signed)
Patient presents for post/op f/u.  Reports brown vaginal spotting and persistent headaches.  Denies any pain or discomfort at this time.  Wants to discuss results.

## 2022-09-29 NOTE — Progress Notes (Signed)
37 yo here for post op check following D&E on 09/16/22. Patient reports feeling well and denies pelvic pain. She reports some spotting but is without any other complaints  Past Medical History:  Diagnosis Date   Gonorrhea    Left cornual ectopic treated with methotrexate therapy 08/02/2016   Past Surgical History:  Procedure Laterality Date   CESAREAN SECTION  05/09/2012   Procedure: CESAREAN SECTION;  Surgeon: Lahoma Crocker, MD;  Location: [facility];  Service: Gynecology;  Laterality: N/A;   DILATION AND EVACUATION N/A 09/16/2022   Procedure: DILATATION AND EVACUATION;  Surgeon: Mora Bellman, MD;  Location: [facility];  Service: Gynecology;  Laterality: N/A;   WISDOM TOOTH EXTRACTION     Family History  Problem Relation Age of Onset   Heart disease Mother    Heart disease Father    Brain cancer Father    Diabetes Maternal Grandmother    Hypertension Maternal Grandmother    Breast cancer Paternal Grandmother    Alzheimer's disease Paternal Grandmother    Anesthesia problems Neg Hx    Other Neg Hx    Social History   Tobacco Use   Smoking status: Never   Smokeless tobacco: Never  Vaping Use   Vaping Use: Never used  Substance Use Topics   Alcohol use: No   Drug use: No   ROS See pertinent in HPI. All other systems reviewed and non contributory Weight 141 lb 8 oz (64.2 kg), last menstrual period 07/12/2022, unknown if currently breastfeeding. GENERAL: Well-developed, well-nourished female in no acute distress.  HEENT: Normocephalic, atraumatic. Sclerae anicteric.  NECK: Supple. Normal thyroid.  LUNGS: Clear to auscultation bilaterally.  HEART: Regular rate and rhythm. BREASTS: Symmetric in size. No palpable masses or lymphadenopathy, skin changes, or nipple drainage. ABDOMEN: Soft, nontender, nondistended. No organomegaly. PELVIC: Normal external female genitalia. Vagina is pink and rugated.  Normal discharge. Normal appearing cervix. Uterus is normal in size. No adnexal  mass or tenderness. Chaperone present during the pelvic exam EXTREMITIES: No cyanosis, clubbing, or edema, 2+ distal pulses.  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

## 2022-10-05 ENCOUNTER — Encounter: Payer: Self-pay | Admitting: Obstetrics & Gynecology

## 2022-10-06 ENCOUNTER — Other Ambulatory Visit: Payer: Self-pay

## 2022-10-06 DIAGNOSIS — Z01419 Encounter for gynecological examination (general) (routine) without abnormal findings: Secondary | ICD-10-CM

## 2022-10-06 MED ORDER — VITAFOL ULTRA 29-0.6-0.4-200 MG PO CAPS: 1.0000 | ORAL_CAPSULE | Freq: Every day | ORAL | 11 refills | Status: DC

## 2022-10-07 ENCOUNTER — Other Ambulatory Visit: Payer: Self-pay | Admitting: *Deleted

## 2022-10-07 ENCOUNTER — Other Ambulatory Visit: Payer: Self-pay

## 2022-10-07 DIAGNOSIS — Z01419 Encounter for gynecological examination (general) (routine) without abnormal findings: Secondary | ICD-10-CM

## 2022-10-07 MED ORDER — VITAFOL ULTRA 29-0.6-0.4-200 MG PO CAPS: 1.0000 | ORAL_CAPSULE | Freq: Every day | ORAL | 11 refills | Status: DC

## 2022-10-18 ENCOUNTER — Telehealth: Payer: Self-pay

## 2022-10-18 ENCOUNTER — Encounter (HOSPITAL_BASED_OUTPATIENT_CLINIC_OR_DEPARTMENT_OTHER): Payer: Self-pay | Admitting: Emergency Medicine

## 2022-10-18 ENCOUNTER — Emergency Department (HOSPITAL_BASED_OUTPATIENT_CLINIC_OR_DEPARTMENT_OTHER)
Admission: EM | Admit: 2022-10-18 | Discharge: 2022-10-18 | Disposition: A | Discharge status: Home / Self Care | Payer: Medicaid Other | Attending: Emergency Medicine | Admitting: Emergency Medicine

## 2022-10-18 ENCOUNTER — Emergency Department (HOSPITAL_BASED_OUTPATIENT_CLINIC_OR_DEPARTMENT_OTHER): Payer: Medicaid Other

## 2022-10-18 ENCOUNTER — Other Ambulatory Visit: Payer: Self-pay

## 2022-10-18 DIAGNOSIS — R1031 Right lower quadrant pain: Secondary | ICD-10-CM | POA: Insufficient documentation

## 2022-10-18 DIAGNOSIS — N939 Abnormal uterine and vaginal bleeding, unspecified: Secondary | ICD-10-CM | POA: Insufficient documentation

## 2022-10-18 DIAGNOSIS — R109 Unspecified abdominal pain: Secondary | ICD-10-CM | POA: Diagnosis present

## 2022-10-18 DIAGNOSIS — R102 Pelvic and perineal pain: Secondary | ICD-10-CM | POA: Diagnosis not present

## 2022-10-18 LAB — COMPREHENSIVE METABOLIC PANEL
ALT: 13 U/L (ref 0–44)
AST: 17 U/L (ref 15–41)
Albumin: 4.4 g/dL (ref 3.5–5.0)
Alkaline Phosphatase: 57 U/L (ref 38–126)
Anion gap: 8 (ref 5–15)
BUN: 11 mg/dL (ref 6–20)
CO2: 26 mmol/L (ref 22–32)
Calcium: 9.6 mg/dL (ref 8.9–10.3)
Chloride: 103 mmol/L (ref 98–111)
Creatinine, Ser: 0.82 mg/dL (ref 0.44–1.00)
GFR, Estimated: >60 mL/min (ref >60)
Glucose, Bld: 107 mg/dL — ABNORMAL HIGH (ref 70–99)
Potassium: 3.6 mmol/L (ref 3.5–5.1)
Sodium: 137 mmol/L (ref 135–145)
Total Bilirubin: 0.4 mg/dL (ref 0.3–1.2)
Total Protein: 7.4 g/dL (ref 6.5–8.1)

## 2022-10-18 LAB — WET PREP, GENITAL
Clue Cells Wet Prep HPF POC: NONE SEEN
Sperm: NONE SEEN
Trich, Wet Prep: NONE SEEN
WBC, Wet Prep HPF POC: <10 (ref <10)
Yeast Wet Prep HPF POC: NONE SEEN

## 2022-10-18 LAB — URINALYSIS, ROUTINE W REFLEX MICROSCOPIC
Bilirubin Urine: NEGATIVE
Glucose, UA: NEGATIVE mg/dL
Hgb urine dipstick: NEGATIVE
Ketones, ur: NEGATIVE mg/dL
Leukocytes,Ua: NEGATIVE
Nitrite: NEGATIVE
Protein, ur: NEGATIVE mg/dL
Specific Gravity, Urine: 1.026 (ref 1.005–1.030)
pH: 5 (ref 5.0–8.0)

## 2022-10-18 LAB — CBC
HCT: 39.3 % (ref 36.0–46.0)
Hemoglobin: 13 g/dL (ref 12.0–15.0)
MCH: 31.2 pg (ref 26.0–34.0)
MCHC: 33.1 g/dL (ref 30.0–36.0)
MCV: 94.2 fL (ref 80.0–100.0)
Platelets: 270 10*3/uL (ref 150–400)
RBC: 4.17 MIL/uL (ref 3.87–5.11)
RDW: 11.9 % (ref 11.5–15.5)
WBC: 4.5 10*3/uL (ref 4.0–10.5)
nRBC: 0 % (ref 0.0–0.2)

## 2022-10-18 LAB — PREGNANCY, URINE: Preg Test, Ur: NEGATIVE

## 2022-10-18 LAB — LIPASE, BLOOD: Lipase: <10 U/L — ABNORMAL LOW (ref 11–51)

## 2022-10-18 LAB — HIV ANTIBODY (ROUTINE TESTING W REFLEX): HIV Screen 4th Generation wRfx: NONREACTIVE

## 2022-10-18 LAB — HCG, QUANTITATIVE, PREGNANCY: hCG, Beta Chain, Quant, S: 8 m[IU]/mL — ABNORMAL HIGH (ref <5)

## 2022-10-18 MED ORDER — ACETAMINOPHEN 500 MG PO TABS
1000.0000 mg | ORAL_TABLET | Freq: Once | ORAL | Status: AC
  Administered 2022-10-18: 1000 mg via ORAL
  Filled 2022-10-18: qty 2

## 2022-10-18 MED ORDER — KETOROLAC TROMETHAMINE 15 MG/ML IJ SOLN
15.0000 mg | Freq: Once | INTRAMUSCULAR | Status: AC
  Administered 2022-10-18: 15 mg via INTRAVENOUS
  Filled 2022-10-18: qty 1

## 2022-10-18 NOTE — Telephone Encounter (Signed)
Patient called stating that she has been having increased cramping since D&C on 12/29. She states that she had mild cramping a couple of days following D&C. Cramping resolved for about 2 weeks, but had brown spotting during that time. Also states that her pain is on the right side and is very tender to the touch when she presses on the area. She states that her cramping started back this past Friday and describes it as severe cramping and rates at a 10/10 at its worst. She has been taking 800 mg ibuprofen every 6 hours.  Pain is relieved with ibuprofen, but returns after ibuprofen wears off. Also using a heating pad. She states that she has started back having the brown discharge yesterday intermittently. States that bleeding in not enough to wear a pad and only sees it when she wipes sometimes.  Also complains of having a mild headaches.   There may be a possibility of another pregnancy, but she has not taken a home pregnancy test at this time.  Patient advised to be evaluated at ER.

## 2022-10-18 NOTE — ED Triage Notes (Signed)
Pt arrives to ED with c/o abdominal pain and cramping since 1/26. She notes she had a D&C last month. The pain is located RLQ. LMP 07/12/22.

## 2022-10-18 NOTE — Consult Note (Signed)
   OB/GYN Telephone Consult  [name] [name] is a 37 y.o. [id number] presenting with abdominal pain and spotting.   I was called for a consult regarding the care of this patient by [facility] .    The provider had a clinical question regarding follow up for patient's pain and bleeding.   The provider presented the following relevant clinical information and I performed a chart review on the patient and reviewed available documentation:  Patient is s/p D&C for missed AB at 7 weeks on 09/16/22. She called our office today - reported spotting & cramping x 2 weeks post procedure that ultimately resolved. Then she started having new cramping for the past four days and spotting yesterday. She is taking ibuprofen 800mg  q6h and a heating pad with some relief. She has been sexually active since her D&C. She was advised to present to ED by our office to assessment of possible pregnancy. Per ED provider, pt is unsure if this is her period or not - it is more painful that her usual.  In ED, pt has been afebrile, HDS. UPT negative, no quantitative BhCG. Hgb 13.0. UA negative LE/nitrite. GC/CT pending. I am unable to personally review her ultrasound images, but radiology read is as follows:  Uterus   Measurements: 9.3 x 5.8 x 6.0 = volume: 170.2 mL. No myometrial mass.   Endometrium   Thickness: 32 mm. Endometrium is distended with fluid and debris. Previous IUP is no longer identified consistent with the patient's history. On color Doppler there is no clear abnormal flow within the endometrium itself simply along its margin. Retained products are still possible. Some of the areas of the debris or nodular as seen on image 28.  IMPRESSION: Dilated endometrium with fluid and debris. No clear abnormal blood flow on color Doppler along the endometrium but some of the areas are nodular. Retained products are not excluded. Please correlate with clinical presentation and patient's beta hCG.    Trace fluid in the left adnexa.  Preserved ovaries.  Since being in ED, pt has received tylenol 1g and toradol 15mg  x1 with resolution of her pain.  BP 135/86   Pulse 79   Temp 98 F (36.7 C)   Resp 16   Ht 4\' 11"  (1.499 m)   Wt 61.2 kg   LMP 07/12/2022   SpO2 100%   BMI 27.27 kg/m   Exam- performed by consulting provider  Recommendations:  - Obtain quantitative BhCG (pt does not need to wait for result to be discharged) - I routed a message to our office ([facility] [place] [facility]) to schedule follow up within 1 week. Will reassess symptoms at that time. - Please instruct patient to return to [facility] Assessment Unit at [facility] if she develops fevers or heavy vaginal bleeding (saturating > 1-2 pads an hour for 2 consecutive hours)  Thank you for this consult and if additional recommendations are needed please call [phone] for the OB/GYN attending on service at [facility].   I spent approximately 5 minutes directly consulting with the provider and verbally discussing this case. Additionally 15 minutes minutes was spent performing chart review and documentation.   Gale Journey, MD Attending [place], [facility] for [facility], [place]

## 2022-10-18 NOTE — ED Provider Notes (Signed)
[facility] Provider Note   CSN: [id number] Arrival date & time: 10/18/22  1747     History Chief Complaint  Patient presents with   Abdominal Pain    HPI [name] [name] is a 37 y.o. female presenting for pelvic pain.  She is a 37 year old female stating that she has right lower pelvic pain.  She had a D&C last month called her OB today who recommended she come to the emergency department for evaluation for ongoing discharge.  She has been sexually active again.  Has not had her menstrual cycle yet.  Denies fevers chills nausea vomiting syncope or shortness of breath.  Otherwise ambulatory tolerating p.o. intake has been taking ibuprofen with mild symptomatic improvement in the interim..   Patient's recorded medical, surgical, social, medication list and allergies were reviewed in the Snapshot window as part of the initial history.   Review of Systems   Review of Systems  Constitutional:  Negative for chills and fever.  HENT:  Negative for ear pain and sore throat.   Eyes:  Negative for pain and visual disturbance.  Respiratory:  Negative for cough and shortness of breath.   Cardiovascular:  Negative for chest pain and palpitations.  Gastrointestinal:  Negative for abdominal pain and vomiting.  Genitourinary:  Positive for vaginal bleeding and vaginal discharge. Negative for dysuria and hematuria.  Musculoskeletal:  Negative for arthralgias and back pain.  Skin:  Negative for color change and rash.  Neurological:  Negative for seizures and syncope.  All other systems reviewed and are negative.   Physical Exam Updated Vital Signs BP 135/86   Pulse 79   Temp 98 F (36.7 C)   Resp 16   Ht 4\' 11"  (1.499 m)   Wt 61.2 kg   LMP 07/12/2022   SpO2 100%   BMI 27.27 kg/m  Physical Exam Vitals and nursing note reviewed.  Constitutional:      General: She is not in acute distress.    Appearance: She is well-developed.  HENT:     Head:  Normocephalic and atraumatic.  Eyes:     Conjunctiva/sclera: Conjunctivae normal.  Cardiovascular:     Rate and Rhythm: Normal rate and regular rhythm.     Heart sounds: No murmur heard. Pulmonary:     Effort: Pulmonary effort is normal. No respiratory distress.     Breath sounds: Normal breath sounds.  Abdominal:     General: There is no distension.     Palpations: Abdomen is soft.     Tenderness: There is no abdominal tenderness. There is no right CVA tenderness or left CVA tenderness.  Musculoskeletal:        General: No swelling or tenderness. Normal range of motion.     Cervical back: Neck supple.  Skin:    General: Skin is warm and dry.  Neurological:     General: No focal deficit present.     Mental Status: She is alert and oriented to person, place, and time. Mental status is at baseline.     Cranial Nerves: No cranial nerve deficit.      ED Course/ Medical Decision Making/ A&P Clinical Course as of 10/18/22 2116  Tue Oct 18, 2022  1809 WBC: 4.5 [CC]  2035 OBGYN Consult [CC]  2058 Add a quant and OB will follow up. [CC]    Clinical Course User Index [CC] Tretha Sciara, MD    Procedures Procedures   Medications Ordered in ED Medications  acetaminophen (  TYLENOL) tablet 1,000 mg (1,000 mg Oral Given 10/18/22 1922)  ketorolac (TORADOL) 15 MG/ML injection 15 mg (15 mg Intravenous Given 10/18/22 1922)    Medical Decision Making:    JOYELLE SIEDLECKI is a 37 y.o. female who presented to the ED today with pelvic and abdominal pain detailed above.     Patient's presentation is complicated by their history of multiple comorbid medical problems including recent procedure.  Patient placed on continuous vitals and telemetry monitoring while in ED which was reviewed periodically.   Complete initial physical exam performed, notably the patient  was hemodynamically stable no acute distress.  Patient deferred pelvic exam to follow-up with obstetrician.      Reviewed and  confirmed nursing documentation for past medical history, family history, social history.    Initial Assessment:   With the patient's presentation of vaginal bleeding/discharge in the setting of recent procedure, most likely diagnosis is retained products of conception versus reinitiation of normal menses. Other diagnoses were considered including (but not limited to) PID, Fitz-Hugh Curtis, ovarian torsion, ectopic pregnancy. These are considered less likely due to history of present illness and physical exam findings.   This is most consistent with an acute life/limb threatening illness complicated by underlying chronic conditions.  Initial Plan:  Gynecologic ultrasound to evaluate for structural intrapelvic etiology of patient's symptoms Screening labs including CBC and Metabolic panel to evaluate for infectious or metabolic etiology of disease.  Urinalysis with reflex culture ordered to evaluate for UTI or relevant urologic/nephrologic pathology.  Objective evaluation as below reviewed with plan for close reassessment  Initial Study Results:   Laboratory  All laboratory results reviewed without evidence of clinically relevant pathology.    Radiology  All images reviewed independently. Agree with radiology report at this time.   US PELVIS (TRANSABDOMINAL ONLY)  Result Date: 10/18/2022 CLINICAL DATA:  Right lower quadrant pain for 5 days. Brown spotting. Status post D and C for failed pregnancy ended December 2023. EXAM: TRANSABDOMINAL AND TRANSVAGINAL ULTRASOUND OF PELVIS DOPPLER ULTRASOUND OF OVARIES TECHNIQUE: Both transabdominal and transvaginal ultrasound examinations of the pelvis were performed. Transabdominal technique was performed for global imaging of the pelvis including uterus, ovaries, adnexal regions, and pelvic cul-de-sac. It was necessary to proceed with endovaginal exam following the transabdominal exam to visualize the endometrium and ovaries. Color and duplex Doppler  ultrasound was utilized to evaluate blood flow to the ovaries. COMPARISON:  09/07/2022 and older FINDINGS: Uterus Measurements: 9.3 x 5.8 x 6.0 = volume: 170.2 mL. No myometrial mass. Endometrium Thickness: 32 mm. Endometrium is distended with fluid and debris. Previous IUP is no longer identified consistent with the patient's history. On color Doppler there is no clear abnormal flow within the endometrium itself simply along its margin. Retained products are still possible. Some of the areas of the debris or nodular as seen on image 28. Right ovary Measurements: 3.3 x 2.7 x 3.5 = volume: 16.3 mL. Normal appearance/no adnexal mass. Left ovary Measurements: 3.2 x 2.2 x 1.8 = volume: 6.5 mL. Normal appearance/no adnexal mass. Pulsed Doppler evaluation of both ovaries demonstrates normal low-resistance arterial and venous waveforms. Other findings Small cystic area in the left adnexa adjacent to left ovary measuring 12 x 9 x 8 mm. This could represent a small amount of fluid IMPRESSION: Dilated endometrium with fluid and debris. No clear abnormal blood flow on color Doppler along the endometrium but some of the areas are nodular. Retained products are not excluded. Please correlate with clinical presentation and patient's  beta hCG. Trace fluid in the left adnexa.  Preserved ovaries. Electronically Signed   By: Jill Side M.D.   On: 10/18/2022 20:12   US PELVIC DOPPLER (TORSION R/O OR MASS ARTERIAL FLOW)  Result Date: 10/18/2022 CLINICAL DATA:  Right lower quadrant pain for 5 days. Brown spotting. Status post D and C for failed pregnancy ended December 2023. EXAM: TRANSABDOMINAL AND TRANSVAGINAL ULTRASOUND OF PELVIS DOPPLER ULTRASOUND OF OVARIES TECHNIQUE: Both transabdominal and transvaginal ultrasound examinations of the pelvis were performed. Transabdominal technique was performed for global imaging of the pelvis including uterus, ovaries, adnexal regions, and pelvic cul-de-sac. It was necessary to proceed with  endovaginal exam following the transabdominal exam to visualize the endometrium and ovaries. Color and duplex Doppler ultrasound was utilized to evaluate blood flow to the ovaries. COMPARISON:  09/07/2022 and older FINDINGS: Uterus Measurements: 9.3 x 5.8 x 6.0 = volume: 170.2 mL. No myometrial mass. Endometrium Thickness: 32 mm. Endometrium is distended with fluid and debris. Previous IUP is no longer identified consistent with the patient's history. On color Doppler there is no clear abnormal flow within the endometrium itself simply along its margin. Retained products are still possible. Some of the areas of the debris or nodular as seen on image 28. Right ovary Measurements: 3.3 x 2.7 x 3.5 = volume: 16.3 mL. Normal appearance/no adnexal mass. Left ovary Measurements: 3.2 x 2.2 x 1.8 = volume: 6.5 mL. Normal appearance/no adnexal mass. Pulsed Doppler evaluation of both ovaries demonstrates normal low-resistance arterial and venous waveforms. Other findings Small cystic area in the left adnexa adjacent to left ovary measuring 12 x 9 x 8 mm. This could represent a small amount of fluid IMPRESSION: Dilated endometrium with fluid and debris. No clear abnormal blood flow on color Doppler along the endometrium but some of the areas are nodular. Retained products are not excluded. Please correlate with clinical presentation and patient's beta hCG. Trace fluid in the left adnexa.  Preserved ovaries. Electronically Signed   By: Jill Side M.D.   On: 10/18/2022 20:12     Consults:  Case discussed with gynecology on-call.  They recommended outpatient follow-up on Thursday, setting of beta-hCG level and given improvement of symptoms they felt comfortable with outpatient care management.  They will call to schedule appointment with patient for Thursday. Final Assessment and Plan:   Informed patient of plan.  Patient's symptoms are grossly under control objective evaluation grossly reassuring.  Recommended close  follow-up with gynecologist on Thursday as requested by gynecology and follow-up of this beta-hCG level.  Patient will follow GC results, HIV results with PCP.  Does not require acute preemptive antibiotics given description of symptoms at this time. Patient referred back to gynecology for close outpatient care management at this time.   Disposition:  I have considered need for hospitalization, however, considering all of the above, I believe this patient is stable for discharge at this time.  Patient/family educated about specific return precautions for given chief complaint and symptoms.  Patient/family educated about follow-up with PCP.     Patient/family expressed understanding of return precautions and need for follow-up. Patient spoken to regarding all imaging and laboratory results and appropriate follow up for these results. All education provided in verbal form with additional information in written form. Time was allowed for answering of patient questions. Patient discharged.    Emergency Department Medication Summary:   Medications  acetaminophen (TYLENOL) tablet 1,000 mg (1,000 mg Oral Given 10/18/22 1922)  ketorolac (TORADOL) 15 MG/ML injection 15  mg (15 mg Intravenous Given 10/18/22 1922)    Clinical Impression:  1. Right lower quadrant abdominal pain   2. Pelvic pain      Discharge   Final Clinical Impression(s) / ED Diagnoses Final diagnoses:  Pelvic pain    Rx / DC Orders ED Discharge Orders     None         Tretha Sciara, MD 10/18/22 2116

## 2022-10-18 NOTE — ED Notes (Signed)
Patient to US via WC.

## 2022-10-19 LAB — GC/CHLAMYDIA PROBE AMP (~~LOC~~) NOT AT ARMC
Chlamydia: NEGATIVE
Comment: NEGATIVE
Comment: NORMAL
Neisseria Gonorrhea: NEGATIVE

## 2022-10-20 ENCOUNTER — Encounter: Payer: Self-pay | Admitting: Obstetrics and Gynecology

## 2022-10-20 ENCOUNTER — Ambulatory Visit (INDEPENDENT_AMBULATORY_CARE_PROVIDER_SITE_OTHER): Payer: Medicaid Other | Admitting: Obstetrics and Gynecology

## 2022-10-20 VITALS — BP 118/75 | HR 73 | Ht 59.0 in | Wt 137.0 lb

## 2022-10-20 DIAGNOSIS — N939 Abnormal uterine and vaginal bleeding, unspecified: Secondary | ICD-10-CM | POA: Diagnosis not present

## 2022-10-20 NOTE — Progress Notes (Signed)
37 y.o GYN presents for ED follow up.  Pt stated that she was having intermittent pain 10/10, however, since she got the IV Med on 10/18/22 the pain has stopped.  Denies bleeding.

## 2022-10-20 NOTE — Progress Notes (Signed)
  CC: ER follow up Subjective:    Patient ID: [name] [name], female    DOB: 1986-08-20, 37 y.o.   MRN: [id number]  HPI 37 yo G12P5 seen for ER follow up from 10/18/22.  Pt had D and C for missed abortion on 09/16/22.  No complications noted.  Pt noted spotting x 2 weeks which was light and then stopped.  Saturday and Sunday she had an acute elevation in pain.  She was seen in the ER where she received toradol and a pelvic ultrasound.  Toradol resolved the sharp pain.  She has only had light brown spotting.   Review of Systems     Objective:   Physical Exam Vitals:   10/20/22 1615  BP: 118/75  Pulse: 73    Abdominal exam:  soft, nontender, nondistended, no pain palpated in any quadrant  CLINICAL DATA:  Right lower quadrant pain for 5 days. Brown spotting. Status post D and C for failed pregnancy ended December 2023.   EXAM: TRANSABDOMINAL AND TRANSVAGINAL ULTRASOUND OF PELVIS   DOPPLER ULTRASOUND OF OVARIES   TECHNIQUE: Both transabdominal and transvaginal ultrasound examinations of the pelvis were performed. Transabdominal technique was performed for global imaging of the pelvis including uterus, ovaries, adnexal regions, and pelvic cul-de-sac.   It was necessary to proceed with endovaginal exam following the transabdominal exam to visualize the endometrium and ovaries. Color and duplex Doppler ultrasound was utilized to evaluate blood flow to the ovaries.   COMPARISON:  09/07/2022 and older   FINDINGS: Uterus   Measurements: 9.3 x 5.8 x 6.0 = volume: 170.2 mL. No myometrial mass.   Endometrium   Thickness: 32 mm. Endometrium is distended with fluid and debris. Previous IUP is no longer identified consistent with the patient's history. On color Doppler there is no clear abnormal flow within the endometrium itself simply along its margin. Retained products are still possible. Some of the areas of the debris or nodular as seen on image 28.   Right ovary    Measurements: 3.3 x 2.7 x 3.5 = volume: 16.3 mL. Normal appearance/no adnexal mass.   Left ovary   Measurements: 3.2 x 2.2 x 1.8 = volume: 6.5 mL. Normal appearance/no adnexal mass.   Pulsed Doppler evaluation of both ovaries demonstrates normal low-resistance arterial and venous waveforms.   Other findings   Small cystic area in the left adnexa adjacent to left ovary measuring 12 x 9 x 8 mm. This could represent a small amount of fluid   IMPRESSION: Dilated endometrium with fluid and debris. No clear abnormal blood flow on color Doppler along the endometrium but some of the areas are nodular. Retained products are not excluded. Please correlate with clinical presentation and patient's beta hCG.   Trace fluid in the left adnexa.  Preserved ovaries.        Assessment & Plan:   1. Abnormal uterine bleeding Pt notes bleeding has stopped.  She denies abdominal and pelvic pain. Bhcg had decreased to 8.  Will recheck bhcg in 1 week. Since patient has benign exam and minimal bleeding, I do not believe she has endometritis or retained POCs. If no menses by  the beginning of March recommend  pregnancy test and progesterone withdrawal challenge  to initiate menses  - Beta hCG quant (ref lab); Future    Griffin Basil, MD Faculty Attending, Center for [facility]

## 2022-10-25 ENCOUNTER — Other Ambulatory Visit: Payer: Medicaid Other

## 2022-11-01 ENCOUNTER — Other Ambulatory Visit: Payer: Medicaid Other

## 2022-11-02 ENCOUNTER — Other Ambulatory Visit: Payer: Medicaid Other

## 2022-11-02 DIAGNOSIS — N939 Abnormal uterine and vaginal bleeding, unspecified: Secondary | ICD-10-CM

## 2022-11-03 LAB — BETA HCG QUANT (REF LAB): hCG Quant: 1 m[IU]/mL

## 2022-11-14 ENCOUNTER — Emergency Department (HOSPITAL_COMMUNITY): Payer: Medicaid Other

## 2022-11-14 ENCOUNTER — Encounter (HOSPITAL_COMMUNITY): Payer: Self-pay

## 2022-11-14 ENCOUNTER — Other Ambulatory Visit: Payer: Self-pay

## 2022-11-14 ENCOUNTER — Emergency Department (HOSPITAL_COMMUNITY)
Admission: EM | Admit: 2022-11-14 | Discharge: 2022-11-14 | Disposition: A | Discharge status: Home / Self Care | Payer: Medicaid Other | Attending: Emergency Medicine | Admitting: Emergency Medicine

## 2022-11-14 DIAGNOSIS — E876 Hypokalemia: Secondary | ICD-10-CM | POA: Diagnosis not present

## 2022-11-14 DIAGNOSIS — R1011 Right upper quadrant pain: Secondary | ICD-10-CM | POA: Insufficient documentation

## 2022-11-14 DIAGNOSIS — N939 Abnormal uterine and vaginal bleeding, unspecified: Secondary | ICD-10-CM | POA: Insufficient documentation

## 2022-11-14 DIAGNOSIS — R1031 Right lower quadrant pain: Secondary | ICD-10-CM | POA: Insufficient documentation

## 2022-11-14 LAB — URINALYSIS, ROUTINE W REFLEX MICROSCOPIC
Bilirubin Urine: NEGATIVE
Glucose, UA: NEGATIVE mg/dL
Ketones, ur: 5 mg/dL — AB
Leukocytes,Ua: NEGATIVE
Nitrite: NEGATIVE
Protein, ur: NEGATIVE mg/dL
Specific Gravity, Urine: 1.013 (ref 1.005–1.030)
pH: 5 (ref 5.0–8.0)

## 2022-11-14 LAB — CBC WITH DIFFERENTIAL/PLATELET
Abs Immature Granulocytes: 0.05 10*3/uL (ref 0.00–0.07)
Basophils Absolute: 0 10*3/uL (ref 0.0–0.1)
Basophils Relative: 0 %
Eosinophils Absolute: 0 10*3/uL (ref 0.0–0.5)
Eosinophils Relative: 0 %
HCT: 36.2 % (ref 36.0–46.0)
Hemoglobin: 11.9 g/dL — ABNORMAL LOW (ref 12.0–15.0)
Immature Granulocytes: 1 %
Lymphocytes Relative: 10 %
Lymphs Abs: 0.8 10*3/uL (ref 0.7–4.0)
MCH: 31 pg (ref 26.0–34.0)
MCHC: 32.9 g/dL (ref 30.0–36.0)
MCV: 94.3 fL (ref 80.0–100.0)
Monocytes Absolute: 0.6 10*3/uL (ref 0.1–1.0)
Monocytes Relative: 7 %
Neutro Abs: 6.5 10*3/uL (ref 1.7–7.7)
Neutrophils Relative %: 82 %
Platelets: 329 10*3/uL (ref 150–400)
RBC: 3.84 MIL/uL — ABNORMAL LOW (ref 3.87–5.11)
RDW: 11.9 % (ref 11.5–15.5)
WBC: 7.9 10*3/uL (ref 4.0–10.5)
nRBC: 0 % (ref 0.0–0.2)

## 2022-11-14 LAB — WET PREP, GENITAL
Clue Cells Wet Prep HPF POC: NONE SEEN
Sperm: NONE SEEN
Trich, Wet Prep: NONE SEEN
WBC, Wet Prep HPF POC: <10 (ref <10)
Yeast Wet Prep HPF POC: NONE SEEN

## 2022-11-14 LAB — COMPREHENSIVE METABOLIC PANEL
ALT: 17 U/L (ref 0–44)
AST: 21 U/L (ref 15–41)
Albumin: 3.5 g/dL (ref 3.5–5.0)
Alkaline Phosphatase: 69 U/L (ref 38–126)
Anion gap: 7 (ref 5–15)
BUN: 8 mg/dL (ref 6–20)
CO2: 23 mmol/L (ref 22–32)
Calcium: 8.7 mg/dL — ABNORMAL LOW (ref 8.9–10.3)
Chloride: 105 mmol/L (ref 98–111)
Creatinine, Ser: 0.75 mg/dL (ref 0.44–1.00)
GFR, Estimated: >60 mL/min (ref >60)
Glucose, Bld: 100 mg/dL — ABNORMAL HIGH (ref 70–99)
Potassium: 3.4 mmol/L — ABNORMAL LOW (ref 3.5–5.1)
Sodium: 135 mmol/L (ref 135–145)
Total Bilirubin: 0.8 mg/dL (ref 0.3–1.2)
Total Protein: 6.4 g/dL — ABNORMAL LOW (ref 6.5–8.1)

## 2022-11-14 LAB — I-STAT BETA HCG BLOOD, ED (MC, WL, AP ONLY): I-stat hCG, quantitative: <5 m[IU]/mL (ref <5)

## 2022-11-14 MED ORDER — MELOXICAM 7.5 MG PO TABS: 7.5000 mg | ORAL_TABLET | Freq: Every day | ORAL | 0 refills | Status: DC

## 2022-11-14 MED ORDER — ONDANSETRON HCL 4 MG/2ML IJ SOLN
4.0000 mg | Freq: Once | INTRAMUSCULAR | Status: AC
  Administered 2022-11-14: 4 mg via INTRAVENOUS
  Filled 2022-11-14: qty 2

## 2022-11-14 MED ORDER — IOHEXOL 350 MG/ML SOLN
75.0000 mL | Freq: Once | INTRAVENOUS | Status: AC | PRN
  Administered 2022-11-14: 75 mL via INTRAVENOUS

## 2022-11-14 MED ORDER — KETOROLAC TROMETHAMINE 30 MG/ML IJ SOLN
30.0000 mg | Freq: Once | INTRAMUSCULAR | Status: AC
  Administered 2022-11-14: 30 mg via INTRAVENOUS
  Filled 2022-11-14: qty 1

## 2022-11-14 NOTE — ED Triage Notes (Signed)
Pt BIB [facility] from home c/o RLQ abdominal pain that has been on going x1 month. Pt states she had a D&C in December and has had pain since and was seen a month ago at [facility] for same. Pt states she started having some vaginal bleeding this morning with the pain. Pt was given 50 of fentanyl with EMS.

## 2022-11-14 NOTE — ED Notes (Signed)
Patient transported to Ct.

## 2022-11-14 NOTE — ED Notes (Signed)
Patient returned from CT

## 2022-11-14 NOTE — Discharge Instructions (Signed)
Please read and follow all provided instructions.  Your diagnoses today include:  1. Right lower quadrant abdominal pain   2. Vaginal bleeding     Tests performed today include: Blood cell counts and platelets Kidney and liver function tests Urine test to look for infection: shows blood, no infection A blood or urine test for pregnancy (women only) CT scan of the abdomen pelvis: Did not show any internal problems which would explain your pain such as appendicitis Ultrasound of the uterus and ovaries: Showed good blood flow to the ovaries, debris noted in previous ultrasound has resolved Vital signs. See below for your results today.   Medications prescribed:  Meloxicam - anti-inflammatory pain medication  You have been prescribed an anti-inflammatory medication or NSAID. Take with food. Do not take aspirin, ibuprofen, or naproxen if taking this medication. Take smallest effective dose for the shortest duration needed for your pain. Stop taking if you experience stomach pain or vomiting.   Take any prescribed medications only as directed.  Home care instructions:  Follow any educational materials contained in this packet.  Follow-up instructions: Please follow-up with your primary care provider or OB/GYN in the next 3 to 5 days for recheck.    Return instructions:  SEEK IMMEDIATE MEDICAL ATTENTION IF: The pain does not go away or becomes severe  A temperature above 101F develops  Repeated vomiting occurs (multiple episodes)  The pain becomes localized to portions of the abdomen. The right side could possibly be appendicitis. In an adult, the left lower portion of the abdomen could be colitis or diverticulitis.  Blood is being passed in stools or vomit (bright red or black tarry stools)  You develop chest pain, difficulty breathing, dizziness or fainting, or become confused, poorly responsive, or inconsolable (young children) If you have any other emergent concerns regarding your  health  Additional Information: Abdominal (belly) pain can be caused by many things. Your caregiver performed an examination and possibly ordered blood/urine tests and imaging (CT scan, x-rays, ultrasound). Many cases can be observed and treated at home after initial evaluation in the emergency department. Even though you are being discharged home, abdominal pain can be unpredictable. Therefore, you need a repeated exam if your pain does not resolve, returns, or worsens. Most patients with abdominal pain don't have to be admitted to the hospital or have surgery, but serious problems like appendicitis and gallbladder attacks can start out as nonspecific pain. Many abdominal conditions cannot be diagnosed in one visit, so follow-up evaluations are very important.  Your vital signs today were: BP 108/71 (BP Location: Left Arm)   Pulse 70   Temp 99 F (37.2 C) (Oral)   Resp 16   Ht '4\' 11"'$  (1.499 m)   Wt 60.3 kg   SpO2 100%   BMI 26.86 kg/m  If your blood pressure (bp) was elevated above 135/85 this visit, please have this repeated by your doctor within one month. --------------

## 2022-11-14 NOTE — ED Provider Notes (Signed)
[facility] Provider Note   CSN: [id number] Arrival date & time: 11/14/22  [id number]     History  Chief Complaint  Patient presents with   Abdominal Pain    [name] [name] is a 37 y.o. female.  Patient presenting to the ED with right lower abdominal/right pelvic pain. Patient had an D&C procedure done in December 2023 for missed AB at 7 weeks. Afterwards in January, patient had been experiencing pelvic pain and bleeding. Patient came to the ED on 10/18/22 and had US showing possibility of retained products of conception.  OB/GYN was consulted and patient subsequently followed up as outpatient on 10/20/2022. Patient states her pelvic pain had resolved until yesterday.  She was cleared from OB/GYN standpoint.  She started experiencing right lower abdominal pain/right pelvic pain yesterday morning. According to the patient, she was taking ibuprofen which helped eased the pain. However, this morning she felt a 10/10 pain with some associated shortness of breath, prompting emergency department visit. She has also experienced some bleeding while being transported to the ED. Patient also reports urinary hesitancy this morning. Patient denies any chest pain or vomiting.       Home Medications Prior to Admission medications   Medication Sig Start Date End Date Taking? Authorizing Provider  Blood Pressure Monitoring (BLOOD PRESSURE KIT) DEVI 1 kit by Does not apply route once a week. 09/15/22   Griffin Basil, MD  ibuprofen (ADVIL) 600 MG tablet Take 1 tablet (600 mg total) by mouth every 6 (six) hours as needed. 09/16/22   Constant, Peggy, MD  oxyCODONE-acetaminophen (PERCOCET/ROXICET) 5-325 MG tablet Take 1 tablet by mouth every 6 (six) hours as needed. 09/16/22   Constant, Peggy, MD  Prenat-Fe Poly-Methfol-FA-DHA (VITAFOL ULTRA) 29-0.6-0.4-200 MG CAPS Take 1 capsule by mouth daily. 10/07/22   Shelly Bombard, MD  Prenatal Vit-Fe Fumarate-FA (PREPLUS) 27-1  MG TABS Take 1 tablet by mouth daily. 09/15/22   Griffin Basil, MD  albuterol (PROVENTIL HFA;VENTOLIN HFA) 108 (90 Base) MCG/ACT inhaler Inhale 2 puffs into the lungs every 6 (six) hours as needed for wheezing or shortness of breath. Patient not taking: Reported on 10/08/2019 07/30/18 06/21/20  Leftwich-Kirby, Kathie Dike, CNM  citalopram (CELEXA) 20 MG tablet Take 1 tablet (20 mg total) by mouth daily. 10/08/19 06/21/20  Leftwich-Kirby, Kathie Dike, CNM  ferrous sulfate (FERROUSUL) 325 (65 FE) MG tablet Take 1 tablet (325 mg total) by mouth 2 (two) times daily. Patient not taking: Reported on 10/08/2019 08/28/18 06/21/20  Constant, Peggy, MD  zolpidem (AMBIEN) 10 MG tablet Take 0.5-1 tablets (5-10 mg total) by mouth at bedtime as needed for sleep. 10/08/19 06/21/20  Leftwich-Kirby, Kathie Dike, CNM      Allergies    Tamiflu [oseltamivir phosphate]    Review of Systems   Review of Systems  Physical Exam Updated Vital Signs BP 109/69   Pulse 74   Temp 99.4 F (37.4 C) (Oral)   Resp 17   Ht '4\' 11"'$  (1.499 m)   Wt 60.3 kg   SpO2 100%   BMI 26.86 kg/m   Physical Exam Vitals and nursing note reviewed. Exam conducted with a chaperone present.  Constitutional:      General: She is not in acute distress.    Appearance: She is well-developed.  HENT:     Head: Normocephalic and atraumatic.     Right Ear: External ear normal.     Left Ear: External ear normal.  Nose: Nose normal.  Eyes:     Conjunctiva/sclera: Conjunctivae normal.  Cardiovascular:     Rate and Rhythm: Normal rate and regular rhythm.     Heart sounds: No murmur heard. Pulmonary:     Effort: No respiratory distress.     Breath sounds: No wheezing, rhonchi or rales.  Abdominal:     Palpations: Abdomen is soft.     Tenderness: There is abdominal tenderness in the right upper quadrant, right lower quadrant, periumbilical area, suprapubic area and left lower quadrant. There is no guarding or rebound. Negative signs include Murphy's sign  and McBurney's sign.  Genitourinary:    Exam position: Lithotomy position.     Labia:        Right: No tenderness.        Left: No tenderness.      Vagina: Normal.     Cervix: Cervical bleeding present. No cervical motion tenderness.     Uterus: Normal. Not enlarged and not tender.      Adnexa:        Right: Tenderness present. No mass or fullness.         Left: No mass, tenderness or fullness.       Comments: None clotted blood, moderate in vaginal vault.  No cervical motion tenderness to suggest PID and I have low suspicion for PID.  Musculoskeletal:     Cervical back: Normal range of motion and neck supple.     Right lower leg: No edema.     Left lower leg: No edema.  Skin:    General: Skin is warm and dry.     Findings: No rash.  Neurological:     General: No focal deficit present.     Mental Status: She is alert. Mental status is at baseline.     Motor: No weakness.  Psychiatric:        Mood and Affect: Mood normal.     ED Results / Procedures / Treatments   Labs (all labs ordered are listed, but only abnormal results are displayed) Labs Reviewed  CBC WITH DIFFERENTIAL/PLATELET - Abnormal; Notable for the following components:      Result Value   RBC 3.84 (*)    Hemoglobin 11.9 (*)    All other components within normal limits  COMPREHENSIVE METABOLIC PANEL - Abnormal; Notable for the following components:   Potassium 3.4 (*)    Glucose, Bld 100 (*)    Calcium 8.7 (*)    Total Protein 6.4 (*)    All other components within normal limits  URINALYSIS, ROUTINE W REFLEX MICROSCOPIC - Abnormal; Notable for the following components:   Hgb urine dipstick LARGE (*)    Ketones, ur 5 (*)    Bacteria, UA RARE (*)    All other components within normal limits  WET PREP, GENITAL  I-STAT BETA HCG BLOOD, ED ([facility], [facility], [facility] ONLY)  GC/CHLAMYDIA PROBE AMP ([place]) NOT AT [facility]    EKG None  Radiology US Pelvis Complete  Result Date: 11/14/2022 CLINICAL DATA:  Right adnexal  pain. EXAM: TRANSABDOMINAL AND TRANSVAGINAL ULTRASOUND OF PELVIS DOPPLER ULTRASOUND OF OVARIES TECHNIQUE: Both transabdominal and transvaginal ultrasound examinations of the pelvis were performed. Transabdominal technique was performed for global imaging of the pelvis including uterus, ovaries, adnexal regions, and pelvic cul-de-sac. It was necessary to proceed with endovaginal exam following the transabdominal exam to visualize the ovaries. Color and duplex Doppler ultrasound was utilized to evaluate blood flow to the ovaries. COMPARISON:  same day  CT abdomen/pelvis, pelvic ultrasound 09/2022 FINDINGS: Uterus Measurements: 9.4 cm x 4.8 cm x 6.0 cm = volume: 143 mL. No fibroids or other mass visualized. Endometrium Thickness: 7. No focal abnormality visualized. Previously seen debris and fluid in the endometrium have resolved. Right ovary Measurements: 3.6 cm x 2.6 cm x 3.5 cm = volume: 17 mL. Normal appearance/no adnexal mass. Left ovary Measurements: 3.3 cm x 2.0 cm x 3.0 cm = volume: 10.6 mL. Normal appearance/no adnexal mass. Pulsed Doppler evaluation of both ovaries demonstrates normal low-resistance arterial and venous waveforms. Other findings No abnormal free fluid. IMPRESSION: Normal pelvic ultrasound. Electronically Signed   By: Valetta Mole M.D.   On: 11/14/2022 13:46   US Transvaginal Non-OB  Result Date: 11/14/2022 CLINICAL DATA:  Right adnexal pain. EXAM: TRANSABDOMINAL AND TRANSVAGINAL ULTRASOUND OF PELVIS DOPPLER ULTRASOUND OF OVARIES TECHNIQUE: Both transabdominal and transvaginal ultrasound examinations of the pelvis were performed. Transabdominal technique was performed for global imaging of the pelvis including uterus, ovaries, adnexal regions, and pelvic cul-de-sac. It was necessary to proceed with endovaginal exam following the transabdominal exam to visualize the ovaries. Color and duplex Doppler ultrasound was utilized to evaluate blood flow to the ovaries. COMPARISON:  same day CT  abdomen/pelvis, pelvic ultrasound 09/2022 FINDINGS: Uterus Measurements: 9.4 cm x 4.8 cm x 6.0 cm = volume: 143 mL. No fibroids or other mass visualized. Endometrium Thickness: 7. No focal abnormality visualized. Previously seen debris and fluid in the endometrium have resolved. Right ovary Measurements: 3.6 cm x 2.6 cm x 3.5 cm = volume: 17 mL. Normal appearance/no adnexal mass. Left ovary Measurements: 3.3 cm x 2.0 cm x 3.0 cm = volume: 10.6 mL. Normal appearance/no adnexal mass. Pulsed Doppler evaluation of both ovaries demonstrates normal low-resistance arterial and venous waveforms. Other findings No abnormal free fluid. IMPRESSION: Normal pelvic ultrasound. Electronically Signed   By: Valetta Mole M.D.   On: 11/14/2022 13:46   [place] Art/Ven Flow Abd Pelv Doppler  Result Date: 11/14/2022 CLINICAL DATA:  Right adnexal pain. EXAM: TRANSABDOMINAL AND TRANSVAGINAL ULTRASOUND OF PELVIS DOPPLER ULTRASOUND OF OVARIES TECHNIQUE: Both transabdominal and transvaginal ultrasound examinations of the pelvis were performed. Transabdominal technique was performed for global imaging of the pelvis including uterus, ovaries, adnexal regions, and pelvic cul-de-sac. It was necessary to proceed with endovaginal exam following the transabdominal exam to visualize the ovaries. Color and duplex Doppler ultrasound was utilized to evaluate blood flow to the ovaries. COMPARISON:  same day CT abdomen/pelvis, pelvic ultrasound 09/2022 FINDINGS: Uterus Measurements: 9.4 cm x 4.8 cm x 6.0 cm = volume: 143 mL. No fibroids or other mass visualized. Endometrium Thickness: 7. No focal abnormality visualized. Previously seen debris and fluid in the endometrium have resolved. Right ovary Measurements: 3.6 cm x 2.6 cm x 3.5 cm = volume: 17 mL. Normal appearance/no adnexal mass. Left ovary Measurements: 3.3 cm x 2.0 cm x 3.0 cm = volume: 10.6 mL. Normal appearance/no adnexal mass. Pulsed Doppler evaluation of both ovaries demonstrates normal  low-resistance arterial and venous waveforms. Other findings No abnormal free fluid. IMPRESSION: Normal pelvic ultrasound. Electronically Signed   By: Valetta Mole M.D.   On: 11/14/2022 13:46   CT ABDOMEN PELVIS W CONTRAST  Result Date: 11/14/2022 CLINICAL DATA:  Right lower quadrant abdominal pain. EXAM: CT ABDOMEN AND PELVIS WITH CONTRAST TECHNIQUE: Multidetector CT imaging of the abdomen and pelvis was performed using the standard protocol following bolus administration of intravenous contrast. RADIATION DOSE REDUCTION: This exam was performed according to the departmental dose-optimization program which  includes automated exposure control, adjustment of the mA and/or kV according to patient size and/or use of iterative reconstruction technique. CONTRAST:  32m OMNIPAQUE IOHEXOL 350 MG/ML SOLN COMPARISON:  None Available. FINDINGS: Lower chest: No acute abnormality. Hepatobiliary: No focal liver abnormality is seen. No gallstones, gallbladder wall thickening, or biliary dilatation. Pancreas: Unremarkable. No pancreatic ductal dilatation or surrounding inflammatory changes. Spleen: Normal in size without focal abnormality. Adrenals/Urinary Tract: Adrenal glands are unremarkable. Kidneys are normal, without renal calculi, focal lesion, or hydronephrosis. Bladder is unremarkable. Stomach/Bowel: Stomach is within normal limits. Appendix appears normal. No evidence of bowel wall thickening, distention, or inflammatory changes. Vascular/Lymphatic: No significant vascular findings are present. No enlarged abdominal or pelvic lymph nodes. Reproductive: Uterus and bilateral adnexa are unremarkable. Other: No abdominal wall hernia or abnormality. No abdominopelvic ascites. Musculoskeletal: No acute or significant osseous findings. IMPRESSION: No definite abnormality seen in the abdomen or pelvis. Electronically Signed   By: JMarijo ConceptionM.D.   On: 11/14/2022 10:45    Procedures Procedures    Medications  Ordered in ED Medications  ondansetron (ZOFRAN) injection 4 mg (4 mg Intravenous Given 11/14/22 0841)  ketorolac (TORADOL) 30 MG/ML injection 30 mg (30 mg Intravenous Given 11/14/22 0841)  iohexol (OMNIPAQUE) 350 MG/ML injection 75 mL (75 mLs Intravenous Contrast Given 11/14/22 1034)    ED Course/ Medical Decision Making/ A&P    Patient seen and examined. History obtained directly from patient.  Reviewed previous ED and OB/GYN notes including outpatient visit.  Reviewed previous ultrasound reports.  Labs/EKG: Ordered CBC, CMP, UA, pregnancy.  Imaging: Ordered CT abdomen pelvis.  Considered repeating pelvic ultrasound, however patient had several weeks of resolution of previous symptoms.  Will consider repeating ultrasound based on results of CT imaging.  Medications/Fluids: Ordered: IV Toradol, IV Zofran.   Most recent vital signs reviewed and are as follows: BP 109/69   Pulse 74   Temp 99.4 F (37.4 C) (Oral)   Resp 17   Ht '4\' 11"'$  (1.499 m)   Wt 60.3 kg   SpO2 100%   BMI 26.86 kg/m   Initial impression: Lower abdominal pain    Reassessment performed. Patient appears stable.  She states that she feels better after Toradol.  Labs personally reviewed and interpreted including: CBC with normal white blood cell count, minimal anemia at hemoglobin of 11.9; CMP with mild hypokalemia at 3.4, glucose 100, normal renal function and liver function testing; pregnancy negative; UA with blood without other compelling signs of infection.  Imaging personally visualized and interpreted including: CT scan of the abdomen pelvis, agree no acute findings.  Reviewed pertinent lab work and imaging with patient at bedside. Questions answered.   Most current vital signs reviewed and are as follows: BP 108/71 (BP Location: Left Arm)   Pulse 70   Temp 99 F (37.2 C) (Oral)   Resp 16   Ht '4\' 11"'$  (1.499 m)   Wt 60.3 kg   SpO2 100%   BMI 26.86 kg/m   Plan: Discussed findings with patient at  bedside.  Discussed discharged home with symptom control and PCP/OB/GYN follow-up versus further evaluation with ultrasound and pelvic exam.  Patient states preference for further evaluation with pelvic and ultrasound given her previous abnormalities.  Will proceed.  Pelvic exam performed with RN chaperone as documented.   2:03 PM Reassessment performed. Patient appears stable.  Labs personally reviewed and interpreted including: Wet prep negative.  Imaging personally visualized and interpreted including: Pelvic ultrasound negative  Reviewed pertinent lab  work and imaging with patient at bedside. Questions answered.   Most current vital signs reviewed and are as follows: BP 108/71 (BP Location: Left Arm)   Pulse 70   Temp 99 F (37.2 C) (Oral)   Resp 16   Ht '4\' 11"'$  (1.499 m)   Wt 60.3 kg   SpO2 100%   BMI 26.86 kg/m   Plan: Discharge to home.   Prescriptions written for: Meloxicam  Other home care instructions discussed: Rest, OTC meds, heat  ED return instructions discussed: Return with worsening pain, vomiting, fever, heavier amounts of bleeding  Follow-up instructions discussed: Patient encouraged to follow-up with their OB/GYN or PCP in the next 7 days for recheck.                            Medical Decision Making Amount and/or Complexity of Data Reviewed Labs: ordered. Radiology: ordered.  Risk Prescription drug management.   For this patient's complaint of abdominal pain, the following conditions were considered on the differential diagnosis: gastritis/PUD, enteritis/duodenitis, appendicitis, cholelithiasis/cholecystitis, cholangitis, pancreatitis, ruptured viscus, colitis, diverticulitis, small/large bowel obstruction, proctitis, cystitis, pyelonephritis, ureteral colic, aortic dissection, aortic aneurysm. In women, ectopic pregnancy, pelvic inflammatory disease, ovarian cysts, and tubo-ovarian abscess were also considered. Atypical chest etiologies were also  considered including ACS, PE, and pneumonia.  The patient's vital signs, pertinent lab work and imaging were reviewed and interpreted as discussed in the ED course. Hospitalization was considered for further testing, treatments, or serial exams/observation. However as patient is well-appearing, has a stable exam, and reassuring studies today, I do not feel that they warrant admission at this time. This plan was discussed with the patient who verbalizes agreement and comfort with this plan and seems reliable and able to return to the Emergency Department with worsening or changing symptoms.          Final Clinical Impression(s) / ED Diagnoses Final diagnoses:  Right lower quadrant abdominal pain  Vaginal bleeding    Rx / DC Orders ED Discharge Orders          Ordered    meloxicam (MOBIC) 7.5 MG tablet  Daily        11/14/22 1356              Carlisle Cater, PA-C 11/14/22 1406    Godfrey Pick, MD 11/15/22 [phone]

## 2022-11-15 LAB — GC/CHLAMYDIA PROBE AMP (~~LOC~~) NOT AT ARMC
Chlamydia: NEGATIVE
Comment: NEGATIVE
Comment: NORMAL
Neisseria Gonorrhea: NEGATIVE

## 2022-12-06 ENCOUNTER — Encounter: Payer: Self-pay | Admitting: Obstetrics and Gynecology

## 2022-12-06 ENCOUNTER — Telehealth (INDEPENDENT_AMBULATORY_CARE_PROVIDER_SITE_OTHER): Payer: Medicaid Other | Admitting: Obstetrics and Gynecology

## 2022-12-06 ENCOUNTER — Telehealth: Payer: Self-pay | Admitting: *Deleted

## 2022-12-06 DIAGNOSIS — Z3009 Encounter for other general counseling and advice on contraception: Secondary | ICD-10-CM | POA: Diagnosis not present

## 2022-12-06 MED ORDER — ETONOGESTREL-ETHINYL ESTRADIOL 0.12-0.015 MG/24HR VA RING: VAGINAL_RING | VAGINAL | 12 refills | Status: DC

## 2022-12-06 NOTE — Telephone Encounter (Signed)
TC to offer virtual visit if desired for today 12/06/22.

## 2022-12-06 NOTE — Progress Notes (Signed)
GYNECOLOGY VIRTUAL VISIT ENCOUNTER NOTE  Provider location: [facility] [place] at [facility]   Patient location: Home  I connected with [name] on 12/06/22 at  1:30 PM EDT by MyChart Video Encounter and verified that I am speaking with the correct person using two identifiers.   I discussed the limitations, risks, security and privacy concerns of performing an evaluation and management service virtually and the availability of in person appointments. I also discussed with the patient that there may be a patient responsible charge related to this service. The patient expressed understanding and agreed to proceed.   History:  [name] [name] is a 37 y.o. [id number] female being evaluated today for contraception counseling. Patient has tried POP and patch in the past. She denies any abnormal vaginal discharge, bleeding, pelvic pain or other concerns.       Past Medical History:  Diagnosis Date   Gonorrhea    Left cornual ectopic treated with methotrexate therapy 08/02/2016   Past Surgical History:  Procedure Laterality Date   CESAREAN SECTION  05/09/2012   Procedure: CESAREAN SECTION;  Surgeon: Lahoma Crocker, MD;  Location: [facility];  Service: Gynecology;  Laterality: N/A;   DILATION AND EVACUATION N/A 09/16/2022   Procedure: DILATATION AND EVACUATION;  Surgeon: Mora Bellman, MD;  Location: [facility];  Service: Gynecology;  Laterality: N/A;   WISDOM TOOTH EXTRACTION     The following portions of the patient's history were reviewed and updated as appropriate: allergies, current medications, past family history, past medical history, past social history, past surgical history and problem list.   Health Maintenance:  Normal pap and negative HRHPV in 2019.    Review of Systems:  Pertinent items noted in HPI and remainder of comprehensive ROS otherwise negative.  Physical Exam:   General:  Alert, oriented and cooperative. Patient appears to be in no acute distress.  Mental  Status: Normal mood and affect. Normal behavior. Normal judgment and thought content.   Respiratory: Normal respiratory effort, no problems with respiration noted  Rest of physical exam deferred due to type of encounter  Labs and Imaging No results found for this or any previous visit (from the past 336 hour(s)). US Pelvis Complete  Result Date: 11/14/2022 CLINICAL DATA:  Right adnexal pain. EXAM: TRANSABDOMINAL AND TRANSVAGINAL ULTRASOUND OF PELVIS DOPPLER ULTRASOUND OF OVARIES TECHNIQUE: Both transabdominal and transvaginal ultrasound examinations of the pelvis were performed. Transabdominal technique was performed for global imaging of the pelvis including uterus, ovaries, adnexal regions, and pelvic cul-de-sac. It was necessary to proceed with endovaginal exam following the transabdominal exam to visualize the ovaries. Color and duplex Doppler ultrasound was utilized to evaluate blood flow to the ovaries. COMPARISON:  same day CT abdomen/pelvis, pelvic ultrasound 09/2022 FINDINGS: Uterus Measurements: 9.4 cm x 4.8 cm x 6.0 cm = volume: 143 mL. No fibroids or other mass visualized. Endometrium Thickness: 7. No focal abnormality visualized. Previously seen debris and fluid in the endometrium have resolved. Right ovary Measurements: 3.6 cm x 2.6 cm x 3.5 cm = volume: 17 mL. Normal appearance/no adnexal mass. Left ovary Measurements: 3.3 cm x 2.0 cm x 3.0 cm = volume: 10.6 mL. Normal appearance/no adnexal mass. Pulsed Doppler evaluation of both ovaries demonstrates normal low-resistance arterial and venous waveforms. Other findings No abnormal free fluid. IMPRESSION: Normal pelvic ultrasound. Electronically Signed   By: Valetta Mole M.D.   On: 11/14/2022 13:46   US Transvaginal Non-OB  Result Date: 11/14/2022 CLINICAL DATA:  Right adnexal pain. EXAM:  TRANSABDOMINAL AND TRANSVAGINAL ULTRASOUND OF PELVIS DOPPLER ULTRASOUND OF OVARIES TECHNIQUE: Both transabdominal and transvaginal ultrasound examinations  of the pelvis were performed. Transabdominal technique was performed for global imaging of the pelvis including uterus, ovaries, adnexal regions, and pelvic cul-de-sac. It was necessary to proceed with endovaginal exam following the transabdominal exam to visualize the ovaries. Color and duplex Doppler ultrasound was utilized to evaluate blood flow to the ovaries. COMPARISON:  same day CT abdomen/pelvis, pelvic ultrasound 09/2022 FINDINGS: Uterus Measurements: 9.4 cm x 4.8 cm x 6.0 cm = volume: 143 mL. No fibroids or other mass visualized. Endometrium Thickness: 7. No focal abnormality visualized. Previously seen debris and fluid in the endometrium have resolved. Right ovary Measurements: 3.6 cm x 2.6 cm x 3.5 cm = volume: 17 mL. Normal appearance/no adnexal mass. Left ovary Measurements: 3.3 cm x 2.0 cm x 3.0 cm = volume: 10.6 mL. Normal appearance/no adnexal mass. Pulsed Doppler evaluation of both ovaries demonstrates normal low-resistance arterial and venous waveforms. Other findings No abnormal free fluid. IMPRESSION: Normal pelvic ultrasound. Electronically Signed   By: Valetta Mole M.D.   On: 11/14/2022 13:46   [place] Art/Ven Flow Abd Pelv Doppler  Result Date: 11/14/2022 CLINICAL DATA:  Right adnexal pain. EXAM: TRANSABDOMINAL AND TRANSVAGINAL ULTRASOUND OF PELVIS DOPPLER ULTRASOUND OF OVARIES TECHNIQUE: Both transabdominal and transvaginal ultrasound examinations of the pelvis were performed. Transabdominal technique was performed for global imaging of the pelvis including uterus, ovaries, adnexal regions, and pelvic cul-de-sac. It was necessary to proceed with endovaginal exam following the transabdominal exam to visualize the ovaries. Color and duplex Doppler ultrasound was utilized to evaluate blood flow to the ovaries. COMPARISON:  same day CT abdomen/pelvis, pelvic ultrasound 09/2022 FINDINGS: Uterus Measurements: 9.4 cm x 4.8 cm x 6.0 cm = volume: 143 mL. No fibroids or other mass visualized.  Endometrium Thickness: 7. No focal abnormality visualized. Previously seen debris and fluid in the endometrium have resolved. Right ovary Measurements: 3.6 cm x 2.6 cm x 3.5 cm = volume: 17 mL. Normal appearance/no adnexal mass. Left ovary Measurements: 3.3 cm x 2.0 cm x 3.0 cm = volume: 10.6 mL. Normal appearance/no adnexal mass. Pulsed Doppler evaluation of both ovaries demonstrates normal low-resistance arterial and venous waveforms. Other findings No abnormal free fluid. IMPRESSION: Normal pelvic ultrasound. Electronically Signed   By: Valetta Mole M.D.   On: 11/14/2022 13:46   CT ABDOMEN PELVIS W CONTRAST  Result Date: 11/14/2022 CLINICAL DATA:  Right lower quadrant abdominal pain. EXAM: CT ABDOMEN AND PELVIS WITH CONTRAST TECHNIQUE: Multidetector CT imaging of the abdomen and pelvis was performed using the standard protocol following bolus administration of intravenous contrast. RADIATION DOSE REDUCTION: This exam was performed according to the departmental dose-optimization program which includes automated exposure control, adjustment of the mA and/or kV according to patient size and/or use of iterative reconstruction technique. CONTRAST:  68mL OMNIPAQUE IOHEXOL 350 MG/ML SOLN COMPARISON:  None Available. FINDINGS: Lower chest: No acute abnormality. Hepatobiliary: No focal liver abnormality is seen. No gallstones, gallbladder wall thickening, or biliary dilatation. Pancreas: Unremarkable. No pancreatic ductal dilatation or surrounding inflammatory changes. Spleen: Normal in size without focal abnormality. Adrenals/Urinary Tract: Adrenal glands are unremarkable. Kidneys are normal, without renal calculi, focal lesion, or hydronephrosis. Bladder is unremarkable. Stomach/Bowel: Stomach is within normal limits. Appendix appears normal. No evidence of bowel wall thickening, distention, or inflammatory changes. Vascular/Lymphatic: No significant vascular findings are present. No enlarged abdominal or pelvic  lymph nodes. Reproductive: Uterus and bilateral adnexa are unremarkable. Other: No abdominal wall  hernia or abnormality. No abdominopelvic ascites. Musculoskeletal: No acute or significant osseous findings. IMPRESSION: No definite abnormality seen in the abdomen or pelvis. Electronically Signed   By: Marijo Conception M.D.   On: 11/14/2022 10:45       Assessment and Plan:     1. Encounter for counseling regarding contraception All options reviewed with the patient including LARCs, patient opted for Nuvaring Patient to return in 3 months for follow up and annual exam with pap smear       I discussed the assessment and treatment plan with the patient. The patient was provided an opportunity to ask questions and all were answered. The patient agreed with the plan and demonstrated an understanding of the instructions.   The patient was advised to call back or seek an in-person evaluation/go to the ED if the symptoms worsen or if the condition fails to improve as anticipated.  I provided 15 minutes of face-to-face time during this encounter.   Mora Bellman, MD [facility] [place]

## 2024-01-11 ENCOUNTER — Other Ambulatory Visit: Payer: Self-pay | Admitting: Physician Assistant

## 2024-01-11 DIAGNOSIS — Z1231 Encounter for screening mammogram for malignant neoplasm of breast: Secondary | ICD-10-CM

## 2024-03-28 ENCOUNTER — Encounter: Payer: Self-pay | Admitting: Podiatry

## 2024-03-28 ENCOUNTER — Ambulatory Visit (INDEPENDENT_AMBULATORY_CARE_PROVIDER_SITE_OTHER)

## 2024-03-28 ENCOUNTER — Ambulatory Visit: Admitting: Podiatry

## 2024-03-28 DIAGNOSIS — F5104 Psychophysiologic insomnia: Secondary | ICD-10-CM | POA: Insufficient documentation

## 2024-03-28 DIAGNOSIS — M7752 Other enthesopathy of left foot: Secondary | ICD-10-CM

## 2024-03-28 DIAGNOSIS — M2142 Flat foot [pes planus] (acquired), left foot: Secondary | ICD-10-CM | POA: Diagnosis not present

## 2024-03-28 DIAGNOSIS — I872 Venous insufficiency (chronic) (peripheral): Secondary | ICD-10-CM

## 2024-03-28 NOTE — Progress Notes (Signed)
 Chief Complaint  Patient presents with   Foot Pain    Left foot/ankle - swelling x 5 years, started when pregnant, not affecting the right foot, increases in size throughout the day and looks like a balloon around foot and ankle end of day, makes shoes tight and uncomfortable, PCP evaluated-referred to out office   New Patient (Initial Visit)    HPI: 38 y.o. female presents today complaining of ongoing chronic left foot and ankle pain and swelling.  This has been going on for over 5 years at this point.  She first began to notice this when she was pregnant.  Seems to be aggravated with activity.  She reports worsening pain and swelling as the day goes on and this makes certain she is uncomfortable.  She states that today is a pretty good day for her.  Past Medical History:  Diagnosis Date   Gonorrhea    Left cornual ectopic treated with methotrexate  therapy 08/02/2016    Past Surgical History:  Procedure Laterality Date   CESAREAN SECTION  05/09/2012   Procedure: CESAREAN SECTION;  Surgeon: Olam Mill, MD;  Location: [facility];  Service: Gynecology;  Laterality: N/A;   DILATION AND EVACUATION N/A 09/16/2022   Procedure: DILATATION AND EVACUATION;  Surgeon: Alger Gong, MD;  Location: [facility];  Service: Gynecology;  Laterality: N/A;   WISDOM TOOTH EXTRACTION      Allergies  Allergen Reactions   Oseltamivir Dermatitis   Tamiflu [Oseltamivir Phosphate] Rash    ROS denies any nausea, vomiting, fever, chills, chest pain, shortness of breath   Physical Exam: There were no vitals filed for this visit.  General: The patient is alert and oriented x3 in no acute distress.  Dermatology: Skin is warm, dry and supple bilateral lower extremities. Interspaces are clear of maceration and debris.    Vascular: Palpable pedal pulses bilaterally. Capillary refill within normal limits.  Slightly increased nonpitting edema left foot and ankle.  Negative Homans' sign.  No pain with  posterior calf squeeze.  Neurological: Light touch sensation grossly intact bilateral feet.   Musculoskeletal Exam: Pes planus foot type relatively mild seen bilaterally.  No symptomatic limitations in pedal range of motion.  Muscle strength 5/5 for all major muscle groups.  Radiographic Exam: Left foot and ankle 4 views weightbearing 03/28/2024 Normal osseous mineralization. Joint spaces preserved.  No fractures or acute osseous irregularities noted.  Pes planus foot type with increased talar head uncoverage noted.  Assessment/Plan of Care: 1. Pes planus of left foot   2. Venous (peripheral) insufficiency      No orders of the defined types were placed in this encounter.  AMB REFERRAL TO VASCULAR SURGERY/CLINIC  Discussed clinical findings with patient today.  Radiographs reviewed with patient.  Patient's symptoms overall not reproducible at today's encounter.  Did suggest possibility of some venous insufficiency or mild lymphedema.  She does report family cardiac history and does have some concern that this could be a contributing factor.  Ordering vascular surgery referral for this.  She does have pes planus which does not seem to directly be bothering her.  Does not seem to be exhibiting tarsal tunnel symptoms today.  Recommend use of good supportive stiff soled shoe, may benefit from a quality over-the-counter orthotic.  Could consider exertional compartment syndrome if further workup is negative.  Follow-up as needed if symptoms worsen.   Peace Jost L. Lamount MAUL, AACFAS [facility]     [address]  [address], [place] [address]                Office [phone]  Fax [phone]

## 2024-04-11 ENCOUNTER — Ambulatory Visit: Admitting: Obstetrics and Gynecology

## 2024-04-11 ENCOUNTER — Other Ambulatory Visit (HOSPITAL_COMMUNITY)
Admission: RE | Admit: 2024-04-11 | Discharge: 2024-04-11 | Disposition: A | Discharge status: Home / Self Care | Source: Ambulatory Visit | Attending: Obstetrics and Gynecology | Admitting: Obstetrics and Gynecology

## 2024-04-11 ENCOUNTER — Encounter: Payer: Self-pay | Admitting: Obstetrics and Gynecology

## 2024-04-11 VITALS — BP 114/74 | HR 76 | Ht 59.0 in | Wt 136.0 lb

## 2024-04-11 DIAGNOSIS — N979 Female infertility, unspecified: Secondary | ICD-10-CM

## 2024-04-11 DIAGNOSIS — N920 Excessive and frequent menstruation with regular cycle: Secondary | ICD-10-CM | POA: Diagnosis present

## 2024-04-11 DIAGNOSIS — Z01419 Encounter for gynecological examination (general) (routine) without abnormal findings: Secondary | ICD-10-CM

## 2024-04-11 NOTE — Progress Notes (Addendum)
 38 y.o. GYN presents for heavy periods, golf ball size blood clots, with spotting before actually periods.  Denies pain or cramping.  Last PAP 02/2024 Negative (PCP) Mammogram was ordered by PCP.

## 2024-04-11 NOTE — Progress Notes (Signed)
 38 yo P5 with LMP 03/31/24 and BMI 27 here for evaluation of menorrhagia with regular cycle. Patient reports heavy vaginal bleeding last month with passage of clots. She states her menses occur monthly and last 5 days. This was the first time, she passed large clots. No change in lifestyle or stress. She is sexually active without contraception and has been trying to conceive since 2023 without success  Past Medical History:  Diagnosis Date   Gonorrhea    Left cornual ectopic treated with methotrexate  therapy 08/02/2016   Past Surgical History:  Procedure Laterality Date   CESAREAN SECTION  05/09/2012   Procedure: CESAREAN SECTION;  Surgeon: Olam Mill, MD;  Location: [facility];  Service: Gynecology;  Laterality: N/A;   DILATION AND EVACUATION N/A 09/16/2022   Procedure: DILATATION AND EVACUATION;  Surgeon: Alger Gong, MD;  Location: [facility];  Service: Gynecology;  Laterality: N/A;   WISDOM TOOTH EXTRACTION     Family History  Problem Relation Age of Onset   Heart disease Mother    Heart disease Father    Brain cancer Father    Diabetes Maternal Grandmother    Hypertension Maternal Grandmother    Breast cancer Paternal Grandmother    Alzheimer's disease Paternal Grandmother    Anesthesia problems Neg Hx    Other Neg Hx    Social History   Tobacco Use   Smoking status: Never   Smokeless tobacco: Never  Vaping Use   Vaping status: Never Used  Substance Use Topics   Alcohol use: No   Drug use: No   ROS See pertinent in HPI. All other systems reviewed and non contributory Blood pressure 114/74, pulse 76, height 4' 11 (1.499 m), weight 136 lb (61.7 kg), last menstrual period 03/31/2024, unknown if currently breastfeeding. GENERAL: Well-developed, well-nourished female in no acute distress.  ABDOMEN: Soft, nontender, nondistended. No organomegaly. PELVIC: Normal external female genitalia. Vagina is pink and rugated.  Normal discharge. Normal appearing cervix. Uterus is  normal in size. No adnexal mass or tenderness. Chaperone present during the pelvic exam EXTREMITIES: No cyanosis, clubbing, or edema, 2+ distal pulses.  A/P 38 yo with an episode of menorrhagia and secondary infertility - vaginal swab collected to rule out infectious process - pelvic ultrasound ordered - Patient also referred to infertility specialist given age and desire to conceive - Patient will be contacted with abnormal results - Patient reports normal pap smear this year with PCP - Patient states PCP ordered screening mammogram

## 2024-04-12 LAB — CERVICOVAGINAL ANCILLARY ONLY
Bacterial Vaginitis (gardnerella): POSITIVE — AB
Candida Glabrata: NEGATIVE
Candida Vaginitis: NEGATIVE
Chlamydia: NEGATIVE
Comment: NEGATIVE
Comment: NEGATIVE
Comment: NEGATIVE
Comment: NEGATIVE
Comment: NORMAL
Neisseria Gonorrhea: NEGATIVE

## 2024-04-15 ENCOUNTER — Ambulatory Visit: Payer: Self-pay | Admitting: Obstetrics and Gynecology

## 2024-04-15 MED ORDER — METRONIDAZOLE 500 MG PO TABS: 500.0000 mg | ORAL_TABLET | Freq: Two times a day (BID) | ORAL | 0 refills | Status: AC

## 2024-04-17 ENCOUNTER — Ambulatory Visit (HOSPITAL_BASED_OUTPATIENT_CLINIC_OR_DEPARTMENT_OTHER)
Admission: RE | Admit: 2024-04-17 | Discharge: 2024-04-17 | Disposition: A | Discharge status: Home / Self Care | Source: Ambulatory Visit | Attending: Obstetrics and Gynecology | Admitting: Obstetrics and Gynecology

## 2024-04-17 DIAGNOSIS — N920 Excessive and frequent menstruation with regular cycle: Secondary | ICD-10-CM | POA: Insufficient documentation

## 2024-06-04 ENCOUNTER — Ambulatory Visit: Admitting: Nurse Practitioner

## 2024-06-22 ENCOUNTER — Other Ambulatory Visit: Payer: Self-pay

## 2024-06-22 ENCOUNTER — Encounter (HOSPITAL_BASED_OUTPATIENT_CLINIC_OR_DEPARTMENT_OTHER): Payer: Self-pay

## 2024-06-22 ENCOUNTER — Emergency Department (HOSPITAL_BASED_OUTPATIENT_CLINIC_OR_DEPARTMENT_OTHER)
Admission: EM | Admit: 2024-06-22 | Discharge: 2024-06-22 | Disposition: A | Discharge status: Home / Self Care | Attending: Emergency Medicine | Admitting: Emergency Medicine

## 2024-06-22 DIAGNOSIS — N939 Abnormal uterine and vaginal bleeding, unspecified: Secondary | ICD-10-CM | POA: Diagnosis present

## 2024-06-22 DIAGNOSIS — N946 Dysmenorrhea, unspecified: Secondary | ICD-10-CM

## 2024-06-22 LAB — CBC WITH DIFFERENTIAL/PLATELET
Abs Immature Granulocytes: 0.01 K/uL (ref 0.00–0.07)
Basophils Absolute: 0 K/uL (ref 0.0–0.1)
Basophils Relative: 1 %
Eosinophils Absolute: 0 K/uL (ref 0.0–0.5)
Eosinophils Relative: 1 %
HCT: 36.8 % (ref 36.0–46.0)
Hemoglobin: 11.5 g/dL — ABNORMAL LOW (ref 12.0–15.0)
Immature Granulocytes: 0 %
Lymphocytes Relative: 33 %
Lymphs Abs: 1.4 K/uL (ref 0.7–4.0)
MCH: 26.9 pg (ref 26.0–34.0)
MCHC: 31.3 g/dL (ref 30.0–36.0)
MCV: 86 fL (ref 80.0–100.0)
Monocytes Absolute: 0.4 K/uL (ref 0.1–1.0)
Monocytes Relative: 8 %
Neutro Abs: 2.5 K/uL (ref 1.7–7.7)
Neutrophils Relative %: 57 %
Platelets: 332 K/uL (ref 150–400)
RBC: 4.28 MIL/uL (ref 3.87–5.11)
RDW: 14.5 % (ref 11.5–15.5)
WBC: 4.3 K/uL (ref 4.0–10.5)
nRBC: 0 % (ref 0.0–0.2)

## 2024-06-22 LAB — URINALYSIS, ROUTINE W REFLEX MICROSCOPIC
Bacteria, UA: NONE SEEN
Bilirubin Urine: NEGATIVE
Glucose, UA: NEGATIVE mg/dL
Ketones, ur: NEGATIVE mg/dL
Leukocytes,Ua: NEGATIVE
Nitrite: NEGATIVE
Protein, ur: NEGATIVE mg/dL
Specific Gravity, Urine: 1.025 (ref 1.005–1.030)
pH: 6.5 (ref 5.0–8.0)

## 2024-06-22 LAB — WET PREP, GENITAL
Clue Cells Wet Prep HPF POC: NONE SEEN
Sperm: NONE SEEN
Trich, Wet Prep: NONE SEEN
WBC, Wet Prep HPF POC: <10 (ref <10)
Yeast Wet Prep HPF POC: NONE SEEN

## 2024-06-22 LAB — COMPREHENSIVE METABOLIC PANEL WITH GFR
ALT: 14 U/L (ref 0–44)
AST: 18 U/L (ref 15–41)
Albumin: 4.7 g/dL (ref 3.5–5.0)
Alkaline Phosphatase: 82 U/L (ref 38–126)
Anion gap: 11 (ref 5–15)
BUN: 11 mg/dL (ref 6–20)
CO2: 25 mmol/L (ref 22–32)
Calcium: 9.9 mg/dL (ref 8.9–10.3)
Chloride: 103 mmol/L (ref 98–111)
Creatinine, Ser: 0.83 mg/dL (ref 0.44–1.00)
GFR, Estimated: >60 mL/min (ref >60)
Glucose, Bld: 114 mg/dL — ABNORMAL HIGH (ref 70–99)
Potassium: 3.6 mmol/L (ref 3.5–5.1)
Sodium: 139 mmol/L (ref 135–145)
Total Bilirubin: 0.4 mg/dL (ref 0.0–1.2)
Total Protein: 8 g/dL (ref 6.5–8.1)

## 2024-06-22 LAB — HCG, QUANTITATIVE, PREGNANCY: hCG, Beta Chain, Quant, S: <1 m[IU]/mL (ref <5)

## 2024-06-22 MED ORDER — ACETAMINOPHEN 500 MG PO TABS
1000.0000 mg | ORAL_TABLET | Freq: Once | ORAL | Status: AC
  Administered 2024-06-22: 1000 mg via ORAL
  Filled 2024-06-22: qty 2

## 2024-06-22 NOTE — ED Triage Notes (Signed)
 Pt states that she noticed pink vaginal discharge yesterday. She had a positive home pregnancy test last night. She states that discharge has now become dark brown in color. Denies abdominal/ back pain. She just wants to confirm the pregnancy and make sure  everything is ok.

## 2024-06-22 NOTE — ED Notes (Signed)
 Canceled OB US  per request from radiology. Not needed. Verbal from EDP Plunkett to d/c

## 2024-06-22 NOTE — ED Provider Notes (Signed)
 [place] EMERGENCY DEPARTMENT AT [facility] Provider Note   CSN: [id number] Arrival date & time: 06/22/24  9442     Patient presents with: Vaginal Bleeding   [name] [name] is a 38 y.o. female.   The patient is a 38 year old female who presents with concerns of possible early pregnancy and abnormal vaginal bleeding. She reports a positive home pregnancy test taken yesterday. Her last menstrual period began on September 5th and lasted eight days, which was slightly longer than her usual six to seven days. She experienced a mucusy, stretchy discharge on Sunday, followed by pink blood mixed with mucus on Monday, and subsequently developed a light flow of dark blood, accompanied by a persistent headache. She took 500 mg of Tylenol  for the headache without relief. The patient has a history of a dilation and curettage (D&C) in 2023 due to a blighted ovum and has experienced irregular periods since, though they have recently become more regular. She also reports a history of four surgical abortions and one medical abortion, as well as a past ectopic pregnancy treated with medication. She denies any significant pain other than the headache and has not experienced similar bleeding patterns in previous pregnancies. The patient mentions a recent diagnosis of bacterial vaginosis treated with metronidazole . She is sexually active and uncertain of her ovulation timing. History was obtained from the patient.   Vaginal Bleeding      Prior to Admission medications   Medication Sig Start Date End Date Taking? Authorizing Provider  Blood Pressure Monitoring (BLOOD PRESSURE KIT) DEVI 1 kit by Does not apply route once a week. 09/15/22   Zina Jerilynn LABOR, MD  cetirizine (ZYRTEC) 10 MG tablet Take 10 mg by mouth daily.    [provider]  ibuprofen  (ADVIL ) 600 MG tablet Take 1 tablet (600 mg total) by mouth every 6 (six) hours as needed. 09/16/22   Constant, Peggy, MD  metroNIDAZOLE  (FLAGYL )  500 MG tablet Take 1 tablet (500 mg total) by mouth 2 (two) times daily. 04/15/24   Constant, Peggy, MD  albuterol  (PROVENTIL  HFA;VENTOLIN  HFA) 108 (90 Base) MCG/ACT inhaler Inhale 2 puffs into the lungs every 6 (six) hours as needed for wheezing or shortness of breath. Patient not taking: Reported on 10/08/2019 07/30/18 06/21/20  Leftwich-Kirby, Olam LABOR, CNM  citalopram  (CELEXA ) 20 MG tablet Take 1 tablet (20 mg total) by mouth daily. 10/08/19 06/21/20  Leftwich-Kirby, Olam LABOR, CNM  ferrous sulfate  (FERROUSUL) 325 (65 FE) MG tablet Take 1 tablet (325 mg total) by mouth 2 (two) times daily. Patient not taking: Reported on 10/08/2019 08/28/18 06/21/20  Constant, Peggy, MD  zolpidem  (AMBIEN ) 10 MG tablet Take 0.5-1 tablets (5-10 mg total) by mouth at bedtime as needed for sleep. 10/08/19 06/21/20  Leftwich-Kirby, Olam LABOR, CNM    Allergies: Oseltamivir and Tamiflu [oseltamivir phosphate]    Review of Systems  Genitourinary:  Positive for vaginal bleeding.    Updated Vital Signs BP (!) 137/97   Pulse 92   Temp 98.3 F (36.8 C) (Oral)   Resp 15   LMP 05/24/2024 (Exact Date)   SpO2 100%   Physical Exam  (all labs ordered are listed, but only abnormal results are displayed) Labs Reviewed  URINALYSIS, ROUTINE W REFLEX MICROSCOPIC - Abnormal; Notable for the following components:      Result Value   Hgb urine dipstick LARGE (*)    All other components within normal limits  WET PREP, GENITAL  HCG, QUANTITATIVE, PREGNANCY  CBC WITH DIFFERENTIAL/PLATELET  COMPREHENSIVE  METABOLIC PANEL WITH GFR  GC/CHLAMYDIA PROBE AMP ([place]) NOT AT [facility]    EKG: None  Radiology: No results found.   Procedures   Medications Ordered in the ED - No data to display                                  Medical Decision Making Amount and/or Complexity of Data Reviewed Labs: ordered. Radiology: ordered.  Risk OTC drugs.  : The patient presented to the ED with concerns about a possible pregnancy and  abnormal vaginal bleeding. The patient reported a positive home pregnancy test but experienced unusual bleeding, which was different from her typical pre-menstrual spotting. She described the bleeding as starting with light pink discharge, progressing to dark blood with a light flow. The patient also reported a persistent headache. She has a history of a D&C in 2023 due to a non-viable pregnancy and has had multiple surgical and one medical abortion. The patient has a history of an ectopic pregnancy treated with medication. A blood test for pregnancy (HCG) was ordered to confirm the pregnancy status. Additional blood tests and a urine test for infection were also ordered. Patient will self swab for gc/chlam/wet prep. Pelvic deferred as no pain. May reconsider based on wet prep/HCG. If hcg positive will get US  to eval for IUP as she is likely too early for me to see on transabdominal.  Care transferred to oncoming provider pending completion of workup, reevaluation and disposition.     Final diagnoses:  None    ED Discharge Orders     None          Graceanne Guin, Selinda, MD 06/22/24 [phone]

## 2024-06-22 NOTE — ED Provider Notes (Signed)
 Patient's hCG returns and is less than 1 without concerning wet prep or urine findings.  This was discussed with the patient and at this time she is stable for discharge.   Doretha Folks, MD 06/22/24 [phone]

## 2024-06-22 NOTE — Discharge Instructions (Signed)
 The blood test for the pregnancy was 0 today so no signs of you being pregnant.  Urine and vaginal swabs were all reassuring.

## 2024-06-24 LAB — GC/CHLAMYDIA PROBE AMP (~~LOC~~) NOT AT ARMC
Chlamydia: NEGATIVE
Comment: NEGATIVE
Comment: NORMAL
Neisseria Gonorrhea: NEGATIVE

## 2024-07-19 ENCOUNTER — Other Ambulatory Visit (HOSPITAL_COMMUNITY): Payer: Self-pay

## 2024-07-22 ENCOUNTER — Other Ambulatory Visit (HOSPITAL_COMMUNITY): Payer: Self-pay

## 2024-07-22 MED ORDER — FLUZONE 0.5 ML IM SUSY
0.5000 mL | PREFILLED_SYRINGE | Freq: Once | INTRAMUSCULAR | 0 refills | Status: AC
  Filled 2024-07-22: qty 0.5, 1d supply, fill #0

## 2024-07-23 ENCOUNTER — Other Ambulatory Visit: Payer: Self-pay | Admitting: Vascular Surgery

## 2024-07-23 DIAGNOSIS — M7989 Other specified soft tissue disorders: Secondary | ICD-10-CM

## 2024-09-09 ENCOUNTER — Ambulatory Visit: Admitting: Primary Care

## 2024-09-16 ENCOUNTER — Ambulatory Visit (HOSPITAL_COMMUNITY)
Admission: RE | Admit: 2024-09-16 | Discharge: 2024-09-16 | Disposition: A | Discharge status: Home / Self Care | Source: Ambulatory Visit | Attending: Vascular Surgery | Admitting: Vascular Surgery

## 2024-09-16 DIAGNOSIS — M7989 Other specified soft tissue disorders: Secondary | ICD-10-CM | POA: Insufficient documentation

## 2024-09-17 ENCOUNTER — Encounter: Payer: Self-pay | Admitting: Vascular Surgery

## 2024-09-17 ENCOUNTER — Ambulatory Visit (HOSPITAL_COMMUNITY): Attending: Vascular Surgery | Admitting: Vascular Surgery

## 2024-09-17 ENCOUNTER — Ambulatory Visit (HOSPITAL_COMMUNITY)

## 2024-09-17 VITALS — BP 124/77 | HR 99 | Ht 59.0 in | Wt 130.0 lb

## 2024-09-17 DIAGNOSIS — I890 Lymphedema, not elsewhere classified: Secondary | ICD-10-CM | POA: Insufficient documentation

## 2024-09-23 NOTE — Progress Notes (Signed)
 [facility] [place]  ASSESSMENT / PLAN: 39 y.o. female with lymphedema and chronic venous insufficiency of left lower extremity causing edema.   Recommend compression and elevation for symptomatic relief. No role for endovenous ablation based on duplex. Follow up as needed.  CHIEF COMPLAINT: leg swelling  HISTORY OF PRESENT ILLNESS: [name] [name] is a 39 y.o. female referred to clinic for evaluation of leg swelling. The patient reports left foot and ankle swelling since having her last child. No venous ulcers, dermatitis, or other skin inflammation.   Past Medical History:  Diagnosis Date   Gonorrhea    Left cornual ectopic treated with methotrexate  therapy 08/02/2016    Past Surgical History:  Procedure Laterality Date   CESAREAN SECTION  05/09/2012   Procedure: CESAREAN SECTION;  Surgeon: Olam Mill, MD;  Location: [facility];  Service: Gynecology;  Laterality: N/A;   DILATION AND EVACUATION N/A 09/16/2022   Procedure: DILATATION AND EVACUATION;  Surgeon: Alger Gong, MD;  Location: [facility];  Service: Gynecology;  Laterality: N/A;   WISDOM TOOTH EXTRACTION      Family History  Problem Relation Age of Onset   Heart disease Mother    Heart disease Father    Brain cancer Father    Diabetes Maternal Grandmother    Hypertension Maternal Grandmother    Breast cancer Paternal Grandmother    Alzheimer's disease Paternal Grandmother    Anesthesia problems Neg Hx    Other Neg Hx     Social History   Socioeconomic History   Marital status: Married    Spouse name: Not on file   Number of children: Not on file   Years of education: Not on file   Highest education level: Not on file  Occupational History   Not on file  Tobacco Use   Smoking status: Never   Smokeless tobacco: Never  Vaping Use   Vaping status: Never Used  Substance and Sexual Activity   Alcohol use: No   Drug use: No   Sexual activity: Yes    Partners: Male    Birth  control/protection: None  Other Topics Concern   Not on file  Social History Narrative   Not on file   Social Drivers of Health   Tobacco Use: Low Risk (09/17/2024)   Patient History    Smoking Tobacco Use: Never    Smokeless Tobacco Use: Never    Passive Exposure: Not on file  Financial Resource Strain: Not on file  Food Insecurity: Not on file  Transportation Needs: Not on file  Physical Activity: Not on file  Stress: Not on file  Social Connections: Not on file  Intimate Partner Violence: Not on file  Depression (PHQ2-9): Low Risk (04/11/2024)   Depression (PHQ2-9)    PHQ-2 Score: 4  Alcohol Screen: Not on file  Housing: Not on file  Utilities: Not on file  Health Literacy: Not on file    Allergies[1]  Current Outpatient Medications  Medication Sig Dispense Refill   Blood Pressure Monitoring (BLOOD PRESSURE KIT) DEVI 1 kit by Does not apply route once a week. 1 each 0   cetirizine (ZYRTEC) 10 MG tablet Take 10 mg by mouth daily.     ibuprofen  (ADVIL ) 600 MG tablet Take 1 tablet (600 mg total) by mouth every 6 (six) hours as needed. 60 tablet 3   metroNIDAZOLE  (FLAGYL ) 500 MG tablet Take 1 tablet (500 mg total) by mouth 2 (two) times daily. 14 tablet 0   No current  facility-administered medications for this visit.    PHYSICAL EXAM Vitals:   09/17/24 1506  BP: 124/77  Pulse: 99  SpO2: 96%  Weight: 130 lb (59 kg)  Height: 4' 11 (1.499 m)   No distress 2+ DP pulses + stemmer's sign on left foot  PERTINENT LABORATORY AND RADIOLOGIC DATA  Most recent CBC    Latest Ref Rng & Units 06/22/2024    6:18 AM 11/14/2022    8:41 AM 10/18/2022    5:55 PM  CBC  WBC 4.0 - 10.5 K/uL 4.3  7.9  4.5   Hemoglobin 12.0 - 15.0 g/dL 88.4  88.0  86.9   Hematocrit 36.0 - 46.0 % 36.8  36.2  39.3   Platelets 150 - 400 K/uL 332  329  270      Most recent CMP    Latest Ref Rng & Units 06/22/2024    6:18 AM 11/14/2022    8:41 AM 10/18/2022    5:55 PM  CMP  Glucose 70 - 99  mg/dL 885  899  892   BUN 6 - 20 mg/dL 11  8  11    Creatinine 0.44 - 1.00 mg/dL 9.16  9.24  9.17   Sodium 135 - 145 mmol/L 139  135  137   Potassium 3.5 - 5.1 mmol/L 3.6  3.4  3.6   Chloride 98 - 111 mmol/L 103  105  103   CO2 22 - 32 mmol/L 25  23  26    Calcium 8.9 - 10.3 mg/dL 9.9  8.7  9.6   Total Protein 6.5 - 8.1 g/dL 8.0  6.4  7.4   Total Bilirubin 0.0 - 1.2 mg/dL 0.4  0.8  0.4   Alkaline Phos 38 - 126 U/L 82  69  57   AST 15 - 41 U/L 18  21  17    ALT 0 - 44 U/L 14  17  13      Renal function CrCl cannot be calculated (Patient's most recent lab result is older than the maximum 21 days allowed.).  Hgb A1c MFr Bld (%)  Date Value  03/16/2017 4.9    Lower Venous Reflux Study   Patient Name:  KEYAH BLIZARD  Date of Exam:   09/16/2024  Medical Rec #: [id number]    Accession #:    [id number]  Date of Birth: August 29, 1986    Patient Gender: F  Patient Age:   38 years  Exam Location:  [address]  Procedure:      VAS US  LOWER EXTREMITY VENOUS REFLUX  Referring Phys: DEBBY ROBERTSON    ---------------------------------------------------------------------------  -----    Indications: Swelling. left anterior foot    Performing Technologist: Devere Dark RVT     Examination Guidelines: A complete evaluation includes B-mode imaging,  spectral  Doppler, color Doppler, and power Doppler as needed of all accessible  portions  of each vessel. Bilateral testing is considered an integral part of a  complete  examination. Limited examinations for reoccurring indications may be  performed  as noted. The reflux portion of the exam is performed with the patient in  reverse Trendelenburg.  Significant venous reflux is defined as >500 ms in the superficial venous  system, and >1 second in the deep venous system.     +--------------+---------+------+-----------+------------+---------+  LEFT         Reflux NoRefluxReflux TimeDiameter cmsComments  Yes                                     +--------------+---------+------+-----------+------------+---------+  CFV                    yes   >1 second                        +--------------+---------+------+-----------+------------+---------+  FV mid        no                                               +--------------+---------+------+-----------+------------+---------+  Popliteal    no                                               +--------------+---------+------+-----------+------------+---------+  GSV at SFJ              yes    >500 ms      0.47               +--------------+---------+------+-----------+------------+---------+  GSV prox thigh          yes    >500 ms      0.39               +--------------+---------+------+-----------+------------+---------+  GSV mid thigh no                            0.19               +--------------+---------+------+-----------+------------+---------+  GSV dist thighno                            0.26               +--------------+---------+------+-----------+------------+---------+  GSV at knee   no                            0.21               +--------------+---------+------+-----------+------------+---------+  GSV prox calf no                            0.12               +--------------+---------+------+-----------+------------+---------+  GSV mid calf                                        Too small  +--------------+---------+------+-----------+------------+---------+  SSV at SPJ                                  0.13    too small  +--------------+---------+------+-----------+------------+---------+  SSV prox calf no  0.08               +--------------+---------+------+-----------+------------+---------+  SSV mid calf  no                            0.14               +--------------+---------+------+-----------+------------+---------+   AASV O        no                            0.19               +--------------+---------+------+-----------+------------+---------+         Summary:  Left:  - No evidence of deep vein thrombosis seen in the left lower extremity,  from the common femoral through the popliteal veins.  - No evidence of superficial venous thrombosis in the left lower  extremity.  - No evidence of superficial venous reflux seen in the left short  saphenous vein.  - Venous reflux is noted in the left common femoral vein.  - Venous reflux is noted in the left sapheno-femoral junction.  - Venous reflux is noted in the left greater saphenous vein in the thigh.    *See table(s) above for measurements and observations.   Electronically signed by Fonda Rim on 09/16/2024 at 3:11:56 PM.   Debby SAILOR. Magda, MD FACS [facility] Phone Number: [phone] 09/23/2024 8:44 AM   Total time spent on preparing this encounter including chart review, data review, collecting history, examining the patient, and coordinating care: 30 min  Portions of this report may have been transcribed using voice recognition software.  Every effort has been made to ensure accuracy; however, inadvertent computerized transcription errors may still be present.       [1]  Allergies Allergen Reactions   Oseltamivir Dermatitis   Tamiflu [Oseltamivir Phosphate] Rash

## 2024-10-02 ENCOUNTER — Other Ambulatory Visit: Payer: Self-pay | Admitting: Obstetrics and Gynecology
